# Patient Record
Sex: Male | Born: 1945 | Race: White | Hispanic: No | Smoking: Never smoker
Health system: Southern US, Community
[De-identification: ages and names within clinical notes are randomized; demographics above are authoritative.]

## PROBLEM LIST (undated history)

## (undated) DIAGNOSIS — K219 Gastro-esophageal reflux disease without esophagitis: Secondary | ICD-10-CM

## (undated) DIAGNOSIS — K259 Gastric ulcer, unspecified as acute or chronic, without hemorrhage or perforation: Secondary | ICD-10-CM

## (undated) DIAGNOSIS — M1990 Unspecified osteoarthritis, unspecified site: Secondary | ICD-10-CM

## (undated) DIAGNOSIS — C801 Malignant (primary) neoplasm, unspecified: Secondary | ICD-10-CM

## (undated) DIAGNOSIS — I10 Essential (primary) hypertension: Secondary | ICD-10-CM

## (undated) HISTORY — PX: EYE SURGERY: SHX253

## (undated) HISTORY — DX: Gastric ulcer, unspecified as acute or chronic, without hemorrhage or perforation: K25.9

## (undated) HISTORY — PX: BACK SURGERY: SHX140

## (undated) HISTORY — PX: ENUCLEATION: SHX628

## (undated) HISTORY — PX: OTHER SURGICAL HISTORY: SHX169

## (undated) HISTORY — DX: Essential (primary) hypertension: I10

## (undated) HISTORY — DX: Gastro-esophageal reflux disease without esophagitis: K21.9

---

## 2001-03-29 ENCOUNTER — Encounter: Payer: Self-pay | Admitting: Internal Medicine

## 2001-03-29 ENCOUNTER — Ambulatory Visit (HOSPITAL_COMMUNITY): Admission: RE | Admit: 2001-03-29 | Discharge: 2001-03-29 | Payer: Self-pay | Admitting: Internal Medicine

## 2001-08-28 ENCOUNTER — Ambulatory Visit (HOSPITAL_COMMUNITY): Admission: RE | Admit: 2001-08-28 | Discharge: 2001-08-28 | Payer: Self-pay | Admitting: Cardiology

## 2002-07-19 ENCOUNTER — Emergency Department (HOSPITAL_COMMUNITY): Admission: EM | Admit: 2002-07-19 | Discharge: 2002-07-20 | Payer: Self-pay | Admitting: Emergency Medicine

## 2003-09-16 ENCOUNTER — Ambulatory Visit (HOSPITAL_COMMUNITY): Admission: RE | Admit: 2003-09-16 | Discharge: 2003-09-16 | Payer: Self-pay

## 2004-08-23 ENCOUNTER — Ambulatory Visit: Payer: Self-pay | Admitting: Internal Medicine

## 2004-08-23 ENCOUNTER — Ambulatory Visit (HOSPITAL_COMMUNITY): Admission: RE | Admit: 2004-08-23 | Discharge: 2004-08-23 | Payer: Self-pay | Admitting: Internal Medicine

## 2004-08-23 ENCOUNTER — Encounter: Payer: Self-pay | Admitting: Internal Medicine

## 2008-02-24 ENCOUNTER — Inpatient Hospital Stay (HOSPITAL_COMMUNITY): Admission: EM | Admit: 2008-02-24 | Discharge: 2008-02-25 | Payer: Self-pay | Admitting: Emergency Medicine

## 2008-02-25 ENCOUNTER — Ambulatory Visit: Payer: Self-pay | Admitting: Cardiology

## 2008-02-25 ENCOUNTER — Encounter: Payer: Self-pay | Admitting: Cardiology

## 2008-02-26 ENCOUNTER — Ambulatory Visit: Payer: Self-pay | Admitting: Cardiology

## 2009-04-02 ENCOUNTER — Emergency Department (HOSPITAL_COMMUNITY): Admission: EM | Admit: 2009-04-02 | Discharge: 2009-04-02 | Payer: Self-pay | Admitting: Emergency Medicine

## 2009-09-17 ENCOUNTER — Encounter (INDEPENDENT_AMBULATORY_CARE_PROVIDER_SITE_OTHER): Payer: Self-pay | Admitting: *Deleted

## 2010-02-08 NOTE — Letter (Signed)
Summary: Recall, Screening Colonoscopy Only  [facility]  [address]   [place], [place] [address]   Phone: [phone]  Fax: [phone]    September 17, 2009  [name] [address] [place], [place]  [address] 12-08-1945   Dear Mr. Shuart,   Our records indicate it is time to schedule your colonoscopy.    Please call our office at [phone] and ask for the nurse.   Thank you,    Hendricks Limes, LPN Cloria Spring, LPN  [facility] Ph: [phone]   Fax: [phone]   Appended Document: Recall, Screening Colonoscopy Only Invalid address,I called pt at home number,lmom.  Appended Document: Recall, Screening Colonoscopy Only Pt stated he had tcs done by Dr Karilyn Cota 2 years ago.  Appended Document: Recall, Screening Colonoscopy Only Per Clydie Braun at Dr Patty Sermons office pt had tcs with [facility] 03/30/2008.

## 2010-04-04 LAB — COMPREHENSIVE METABOLIC PANEL
Alkaline Phosphatase: 59 U/L (ref 39–117)
BUN: 16 mg/dL (ref 6–23)
Creatinine, Ser: 1.42 mg/dL (ref 0.4–1.5)
Glucose, Bld: 152 mg/dL — ABNORMAL HIGH (ref 70–99)
Potassium: 3.6 mEq/L (ref 3.5–5.1)
Total Bilirubin: 1.1 mg/dL (ref 0.3–1.2)
Total Protein: 6.6 g/dL (ref 6.0–8.3)

## 2010-04-04 LAB — DIFFERENTIAL
Basophils Absolute: 0 10*3/uL (ref 0.0–0.1)
Basophils Relative: 0 % (ref 0–1)
Monocytes Relative: 9 % (ref 3–12)
Neutro Abs: 8.2 10*3/uL — ABNORMAL HIGH (ref 1.7–7.7)
Neutrophils Relative %: 79 % — ABNORMAL HIGH (ref 43–77)

## 2010-04-04 LAB — CBC
HCT: 40.1 % (ref 39.0–52.0)
Hemoglobin: 13.7 g/dL (ref 13.0–17.0)
MCV: 87.2 fL (ref 78.0–100.0)
RDW: 15.7 % — ABNORMAL HIGH (ref 11.5–15.5)

## 2010-04-26 LAB — BASIC METABOLIC PANEL
BUN: 13 mg/dL (ref 6–23)
Calcium: 9.6 mg/dL (ref 8.4–10.5)
Calcium: 9.1 mg/dL (ref 8.4–10.5)
Creatinine, Ser: 1.01 mg/dL (ref 0.4–1.5)
GFR calc Af Amer: >60 mL/min (ref >60)
GFR calc non Af Amer: >60 mL/min (ref >60)
GFR calc non Af Amer: >60 mL/min (ref >60)
Potassium: 3.8 mEq/L (ref 3.5–5.1)
Sodium: 141 mEq/L (ref 135–145)

## 2010-04-26 LAB — CBC
Hemoglobin: 13.3 g/dL (ref 13.0–17.0)
Platelets: 170 10*3/uL (ref 150–400)
RBC: 4.51 MIL/uL (ref 4.22–5.81)
RDW: 15.7 % — ABNORMAL HIGH (ref 11.5–15.5)
WBC: 7.9 10*3/uL (ref 4.0–10.5)
WBC: 6.3 10*3/uL (ref 4.0–10.5)

## 2010-04-26 LAB — URINALYSIS, ROUTINE W REFLEX MICROSCOPIC
Bilirubin Urine: NEGATIVE
Glucose, UA: 100 mg/dL — AB
Ketones, ur: NEGATIVE mg/dL
Protein, ur: NEGATIVE mg/dL

## 2010-04-26 LAB — GLUCOSE, CAPILLARY

## 2010-04-26 LAB — LIPID PANEL
HDL: 34 mg/dL — ABNORMAL LOW (ref >39)
LDL Cholesterol: 122 mg/dL — ABNORMAL HIGH (ref 0–99)
Total CHOL/HDL Ratio: 5.1 RATIO
Triglycerides: 92 mg/dL (ref <150)
VLDL: 18 mg/dL (ref 0–40)

## 2010-04-26 LAB — HEMOGLOBIN A1C
Hgb A1c MFr Bld: 6 % (ref 4.6–6.1)
Mean Plasma Glucose: 126 mg/dL

## 2010-04-26 LAB — DIFFERENTIAL
Basophils Absolute: 0 10*3/uL (ref 0.0–0.1)
Basophils Absolute: 0 10*3/uL (ref 0.0–0.1)
Lymphocytes Relative: 34 % (ref 12–46)
Lymphocytes Relative: 36 % (ref 12–46)
Lymphs Abs: 2.3 10*3/uL (ref 0.7–4.0)
Monocytes Absolute: 0.6 10*3/uL (ref 0.1–1.0)
Monocytes Relative: 7 % (ref 3–12)
Neutro Abs: 4.6 10*3/uL (ref 1.7–7.7)
Neutro Abs: 3.4 10*3/uL (ref 1.7–7.7)
Neutrophils Relative %: 54 % (ref 43–77)

## 2010-04-26 LAB — HEPATIC FUNCTION PANEL
AST: 20 U/L (ref 0–37)
Bilirubin, Direct: <0.1 mg/dL (ref 0.0–0.3)
Total Bilirubin: 0.7 mg/dL (ref 0.3–1.2)

## 2010-04-26 LAB — CARDIAC PANEL(CRET KIN+CKTOT+MB+TROPI)
CK, MB: 1.3 ng/mL (ref 0.3–4.0)
Relative Index: INVALID (ref 0.0–2.5)
Relative Index: INVALID (ref 0.0–2.5)
Troponin I: <0.01 ng/mL (ref 0.00–0.06)
Troponin I: <0.01 ng/mL (ref 0.00–0.06)

## 2010-04-26 LAB — POCT CARDIAC MARKERS
CKMB, poc: <1 ng/mL — ABNORMAL LOW (ref 1.0–8.0)
CKMB, poc: <1 ng/mL — ABNORMAL LOW (ref 1.0–8.0)
Myoglobin, poc: 35.2 ng/mL (ref 12–200)
Troponin i, poc: <0.05 ng/mL (ref 0.00–0.09)

## 2010-04-26 LAB — D-DIMER, QUANTITATIVE: D-Dimer, Quant: 0.31 ug/mL-FEU (ref 0.00–0.48)

## 2010-05-24 NOTE — Consult Note (Signed)
[name], [name]              ACCOUNT NO.:  [id number]   MEDICAL RECORD NO.:  [id number]          PATIENT TYPE:  INP   LOCATION:  A228                          FACILITY:  [facility]   PHYSICIAN:  Gerrit Friends. Dietrich Pates, MD, FACCDATE OF BIRTH:  03-07-1945   DATE OF CONSULTATION:  02/25/2008  DATE OF DISCHARGE:                                 CONSULTATION   REFERRING PHYSICIAN:  Dr. Lonia Blood of [facility].   PRIMARY CARE PHYSICIAN:  Dr. Samuel Jester.   CARDIOLOGIST:  He was previously evaluated by Dr. Diona Browner in [place] in  August of 2003.   REASON FOR REFERRAL:  Chest pain.   HISTORY OF PRESENT ILLNESS:  [name] is a 65 year old male with a  history of diabetes mellitus and hypertension who had normal coronary  arteries by cardiac catheterization in 2003 who presented to [facility] yesterday with substernal heaviness that began around 2 to 3:00  in the afternoon.  He was at rest watching television when this  occurred.  He denies associated shortness of breath, nausea,  diaphoresis.  He felt like it was difficult to take a deep breath.  He  denies any radiating symptoms.  His pain was not aggravated by anything.  He notes no improvement with nitroglycerin.  He is still having some  minor discomfort at this time.  He now notes some epigastric discomfort  as well.  His cardiac markers have been negative thus far.  We are now  asked to further evaluate for chest pain.   PAST MEDICAL HISTORY:  1. As outlined above.  Cardiac catheterization 2003 demonstrated      minimal irregularities in the LAD, normal LV function.  2. Diabetes mellitus.  3. Hypertension - he does not think he takes any medications for this      at this time.  4. Status post lipoma resection.  5. History of bradycardia.  6. History of left eye melanoma status post extraction.  7. History of right retinal detachment.   MEDICATIONS AT HOME:  1. Multivitamin daily.  2. Aspirin 81 mg  daily.  3. He thinks his other 2 medications are both diabetic medicines.  He      does not know the names.   ALLERGIES:  PENICILLIN, IV DYE, IODINE.   SOCIAL HISTORY:  The patient lives in [place] by himself.  He is  retired.  He is divorced.  He has 2 children.  He has a remote history  of tobacco smoking and quit in his teenage years.  He denies alcohol or  drug abuse.  He exercises daily without chest pain or shortness of  breath.   FAMILY HISTORY:  Significant for diabetes in his mother who is deceased.  Alzheimer's in his father who is deceased.  No premature CAD noted.   REVIEW OF SYSTEMS:  Please see HPI.  Denies fevers, chills, headache,  rash, sore throat, dysuria, hematuria, bright red blood per rectum or  melena, dysphagia, GERD symptoms, dyspnea with exertion, orthopnea, PND,  pedal edema, palpitations, syncope, near syncope.  He denies cough.  He  did  note some lightheadedness yesterday with standing.  The rest of the  review of systems are negative.   PHYSICAL EXAM:  CONSTITUTIONAL:  He is a well-nourished, well-developed  male in no acute distress.  VITAL SIGNS:  Blood pressure 122/71, pulse 52, respirations 20,  temperature 98.4, oxygen saturation 96% on 0.5 liters per minute.  HEENT:  Normal.  NECK:  Without JVD.  LYMPHATICS:  Without lymphadenopathy.  ENDOCRINE:  Without thyromegaly.  CARDIAC:  Normal S1, S2.  Regular rate and rhythm without murmur.  LUNGS:  Clear to auscultation bilaterally without wheezing, rhonchi or  rales.  SKIN:  Without rash.  ABDOMEN:  Soft with normoactive bowel sounds.  No organomegaly.  Positive epigastric tenderness with palpation.  EXTREMITIES:  Without clubbing, cyanosis or edema.  MUSCULOSKELETAL:  Without joint deformity.  NEUROLOGIC:  He is alert and oriented x3.  Cranial nerves II-XII grossly  intact.   DIAGNOSTIC STUDIES:  Chest x-ray demonstrates no active disease.  EKG  sinus bradycardia with a heart rate of 52, normal  axis, nonspecific ST-T  wave changes.   LABS:  White count 6300, hemoglobin 12.6 and platelet count 170,000.  Potassium 3.8, creatinine 1.01, glucose 117.  Point of care markers  negative x1.  CK 83, CK-MB 1.4, troponin-I less than 0.01.   ASSESSMENT:  1. Chest discomfort in a 65 year old male with a history of normal      coronaries by cardiac catheterization in 2003 and overall preserved      left ventricular function.  He is also experiencing some epigastric      pain that has begun since he presented to the hospital.  2. Diabetes mellitus.  3. Hypertension.   RECOMMENDATIONS:  The patient was also interviewed and examined by Dr.  Dietrich Pates.  The patient presents with symptoms that are fairly atypical  for ischemia.  He had a negative cath in 2003.  An echocardiogram will  be obtained as well as lipid panel.  His goal LDL really should be  around 70 or less with his history of diabetes  mellitus.  He will undergo gated exercise treadmill testing later today.  If this is negative, then we would suspect GI etiology to blame for his  symptoms.  Thank you very much for this consultation.  We will be glad  to follow the patient throughout the remainder of this admission.      Tereso Newcomer, PA-C      Gerrit Friends. Dietrich Pates, MD, [facility]  Electronically Signed    SW/MEDQ  D:  02/25/2008  T:  02/25/2008  Job:  [id number]   cc:   Lonia Blood, M.D.   Samuel Jester  Fax: [phone]

## 2010-05-24 NOTE — H&P (Signed)
[name], [name]              ACCOUNT NO.:  [id number]   MEDICAL RECORD NO.:  [id number]          PATIENT TYPE:  INP   LOCATION:  A228                          FACILITY:  [facility]   PHYSICIAN:  Osvaldo Shipper, MD     DATE OF BIRTH:  May 07, 1945   DATE OF ADMISSION:  02/24/2008  DATE OF DISCHARGE:  LH                              HISTORY & PHYSICAL   PRIMARY CARE PHYSICIAN:  [name], M.D.   ADMISSION DIAGNOSES:  1. Left-sided chest pain.  2. Type 2 diabetes.  3. Nonspecific abdominal pain.   CHIEF COMPLAINT:  Left-sided chest pain since this afternoon.   HISTORY OF PRESENT ILLNESS:  The patient is a 65 year old Caucasian male  who has type 2 diabetes, who was in his usual state of health until  about 4 p.m. today when he started developing left-sided chest pain.  He  describes this as a tightness which was about 4-5/10 in intensity.  The  pain did not radiate anywhere.  There was some nausea associated with  this pain.  No vomiting, no cough, no fever.  He also had some shortness  of breath, especially with deep breathing.  Denies any palpitations.  He  has never had similar pain in the past.  Nitroglycerin did not cause any  relief nor did Vicodin. He denies any leg swelling, denies any heart  disease in the past.   The patient also mentions that yesterday he was having pain in his  abdomen without any nausea or vomiting.  He is unable to describe the  symptoms any further at this time.   MEDICATIONS AT HOME:  Unfortunately he did not bring his home  medications.  He is on a lot of vitamins and he states he takes two  different medications for his diabetes but does not know the names.   ALLERGIES:  1. IVP DYE which causes hives.  2. PENICILLIN which causes hives.   PAST MEDICAL HISTORY:  1. Type 2 diabetes.  2. 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  The melanoma was approximately 20 years ago.   PAST SURGICAL HISTORY:  Lipoma.   SOCIAL HISTORY:  He lives in [place].  He is currently unemployed.  No  smoking, no alcohol use, no illicit drug use.   FAMILY HISTORY:  Positive for Alzheimer's, diabetes, and prostate  cancer.   REVIEW OF SYSTEMS:  GENERAL:  System positive for weakness.  HEENT:  Unremarkable.  CARDIOVASCULAR:  As in HPI.  RESPIRATORY:  As in HPI.  GENITOURINARY:  Unremarkable.  GASTROINTESTINAL:  As in HPI.  MUSCULOSKELETAL:  Unremarkable.  PSYCHIATRIC:  Unremarkable.  NEUROLOGIC:  Unremarkable.  RHEUMATOLOGIC:  Unremarkable.  Other systems  unremarkable.   PHYSICAL EXAMINATION:  VITAL SIGNS:  Temperature 98.5, blood pressure  when he came in was 143/69, last reading is 129/67, heart rate in the  60s and regular, respiratory rate 18, saturation 98% on room air.  GENERAL:  Obese white male in no distress.  HEENT:  There is no pallor, no icterus, although mucous membrane is  moist.  No oral lesions are noted.  Left eye is artificial, right eye  pupil is not circular and is poorly reacting to light.  LUNGS:  Clear to auscultation bilaterally.  CARDIOVASCULAR:  S1/S2 is normal, regular.  No murmurs appreciated.  No  S3, S4, no rubs, no bruits.  NECK:  Soft and supple.  No thyromegaly is appreciated.  ABDOMEN:  Soft, nondistended.  There is slight tenderness vaguely in the  epigastric area in the left upper quadrant, but no rebound, rigidity, or  guarding is present.  GENITOURINARY:  Examination deferred.  EXTREMITIES:  Show no edema.  MUSCULOSKELETAL:  Exam was unremarkable.  NEUROLOGIC:  He is alert, oriented x3.  No focal neurological deficits  are present.   LABORATORY DATA:  CBC is unremarkable.  BMET is unremarkable except for  a glucose of 132.  Two sets of cardiac markers are negative.   Chest x-ray shows no acute findings.  Mediastinum is normal size.  EKG  shows a sinus rhythm with a normal axis and  what appears to be in the  normal range.  Borderline criteria for LVH.  No Q-wave is noted.  He  does have evidence for incomplete RBBB.  No other acute ST or T-wave  changes are noted.  Previous EKG from 2005 shows PVCs and no other acute  abnormalities.   ASSESSMENT:  This is a 65 year old Caucasian male who has type 2  diabetes equivalent with left-sided chest tightness of about seven  hours' duration.  His EKG is nonacute.  Cardiac markers are negative so  far.  He does have risk factors in the form of obesity as well as  diabetes.  The patient needs observation in the hospital to rule him out  for acute coronary syndrome and for [place] to go ahead and consult  cardiology in the morning.   PLAN:  1. Chest pain.  We will put him on aspirin.  Lipid panel will be      checked.  EKG will be repeated.  Nitro paste will be utilized and      we may also give him some morphine as needed for pain.  Of note he      did have a cardiac cath in 2003 in which EF was about 60% and no      coronary artery disease was noted except for minimal irregularities      in the LAD.  2. Type 2 diabetes.  I have requested the family to bring in his home      medications.  Will check his CBGs q.a.c. and bedtime.  Put him on a      sliding scale.  He will be on a modified carbohydrate diet.  3. Abdominal pain.  This is very nonspecific.  Will check lipase and      his LFTs and order an ultrasound of his abdomen tomorrow morning.      Of note he did have an MRI of the abdomen in 2003.  No AAA was      noted.  There was evidence for  a sclerotic disease but patent SMA.      Celiac arteries and renal arteries were noted.  No      evidence of mesenteric ischemia was noted.  His UA will be checked      as well.  4. DVT prophylaxis will be  given.  He will be on PPI.   Further management decisions will depend on results of further testing  and patient's response to treatment.      Osvaldo Shipper, MD   Electronically Signed     GK/MEDQ  D:  02/24/2008  T:  02/24/2008  Job:  [id number]   cc:   [name]  Fax: [phone]

## 2010-05-27 NOTE — Op Note (Signed)
NAME:  [name], [name] NO.:  [id number]   MEDICAL RECORD NO.:  [id number]                   PATIENT TYPE:  OIB   LOCATION:  2852                                 FACILITY:  [facility]   PHYSICIAN:  Lorre Munroe., M.D.            DATE OF BIRTH:  05-19-1945   DATE OF PROCEDURE:  09/16/2003  DATE OF DISCHARGE:                                 OPERATIVE REPORT   PREOPERATIVE DIAGNOSIS:  Mass of the left thigh.   POSTOPERATIVE DIAGNOSIS:  Lipoma, left thigh.   OPERATION:  Excision of lipoma.   SURGEON:  Lebron Conners, M.D.   ANESTHESIA:  General and local.   DESCRIPTION OF PROCEDURE:  After The patient was monitored and anesthetized  with general anesthesia and had a routine preparation and draping of the  left upper inner thigh, I made about a 4 cm incision directly over the mass,  which I could feel in the deep subcutaneous tissues.  I dissected down  towards it through the normal-appearing fat.  Hemostasis with the cautery,  and dissecting with the cautery.  A typical lipoma was encountered, bulging  up from the tissues just superficial to the thigh muscles and lateral to the  femoral vessels.  It was a bit more firm and well-organized and surrounding  normal fat, but was obviously a lipoma and not a malignant mass.  I  completely excised it and discarded it, and then got good hemostasis with  cautery.  I thoroughly anesthetized the area with long-acting local  anesthetic.  I closed the subcutaneous tissues with interrupted #4-0 Vicryl  and closed the skin with a running inter-cuticular #4-0 Vicryl, reinforced  by Steri-Strips.  The patient tolerated the operation well.                                               Lorre Munroe., M.D.    Jodi Marble  D:  09/16/2003  T:  09/16/2003  Job:  [id number]

## 2010-05-27 NOTE — Procedures (Signed)
NAMEDREYSON, MISHKIN              ACCOUNT NO.:  [id number]   MEDICAL RECORD NO.:  [id number]          PATIENT TYPE:  INP   LOCATION:  A228                          FACILITY:  [facility]   PHYSICIAN:  Gerrit Friends. Dietrich Pates, MD, FACCDATE OF BIRTH:  [birth year]   DATE OF PROCEDURE:  02/26/2008  DATE OF DISCHARGE:  02/25/2008                                ECHOCARDIOGRAM   REFERRING PHYSICIAN:  Hollice Espy, MD, from [facility].   PRIMARY CARE PHYSICIAN:  Dr. Samuel Jester.   INDICATIONS:  A 65 year old gentleman admitted to hospital with chest  discomfort consistent with myocardial ischemia.  1. Treadmill exercise performed to a workload of 13 METS and a heart      rate of 140, 89% of age-predicted maximum.  Exercise discontinued      due to dyspnea and fatigue; no chest discomfort reported.  2. Blood pressure increased from a resting value of 140/75 to 210/80      at peak exercise, a mildly hypertensive response.  3. No arrhythmias noted.  4. EKG:  Sinus bradycardia at a rate of 59; right ventricular      conduction delay; otherwise unremarkable.  5. Stress EKG:  Insignificant upsloping ST-segment depression.  6. No arrhythmias noted.   IMPRESSION:  Negative and adequate graded exercise test revealing good  exercise tolerance, a mildly hypertensive blood pressure response, no  reproduction of the patient's presenting symptoms, and a normal stress  EKG.  Other findings as noted.      Gerrit Friends. Dietrich Pates, MD, [facility]  Electronically Signed     RMR/MEDQ  D:  02/26/2008  T:  02/27/2008  Job:  [id number]

## 2010-05-27 NOTE — Op Note (Signed)
NAME:  [name], [name]              ACCOUNT NO.:  [id number]   MEDICAL RECORD NO.:  [id number]          PATIENT TYPE:  AMB   LOCATION:  DAY                           [facility]ACILITY:  [facility]   PHYSICIAN:  R. Roetta Sessions, M.D. DATE OF BIRTH:  24-Dec-1945   DATE OF PROCEDURE:  08/23/2004  DATE OF DISCHARGE:                                 OPERATIVE REPORT   PROCEDURE:  Colonoscopy with snare polypectomy and ablation.   INDICATIONS FOR PROCEDURE:  The patient is a 65 year old gentleman with a  history of colonic adenomatous polyps, last colonoscopy was 2001. He had no  polyps on that examination. He is devoid of any lower GI tract symptoms. He  is here for surveillance. This approach has been discussed with the patient  at length. Potential risks, benefits, and alternatives have been reviewed  and questions answered. He is agreeable.   PROCEDURE NOTE:  O2 saturation, blood pressure, pulse, and respirations were  monitored throughout the entire procedure. Conscious sedation with Versed 4  mg IV and Demerol 100 mg IV. Prior to procedure, he received IV ampicillin  and gentamicin for SB prophylaxis prior to the procedure.   FINDINGS:  Digital rectal exam revealed no abnormalities.   ENDOSCOPIC FINDINGS:  Prep was good.   Rectum:  Examination of the rectal mucosa including retroflexed view of the  anal verge revealed a 6-mm polyp on a stalk at 15 cm. There were a couple of  3-mm diminutive polyps adjacent to the larger polyp. The remainder of the  rectal mucosa appeared normal.   Colon:  Colonic mucosa was surveyed from the rectosigmoid junction through  the left, transverse, and right colon to the area of the appendiceal  orifice, ileocecal valve, and cecum. These structures were well seen and  photographed for the record. From this level, the scope was slowly  withdrawn, and all previously mentioned mucosal surfaces were again seen.  The patient had few scattered pan colonic diverticula. The  remainder of the  colonic mucosa appeared normal. The pedunculated polyp in the rectum was  removed with snare cautery and recovered through the scope. The two adjacent  diminutive polyps were destroyed with the tip of the snare. The patient  tolerated the procedure well and was reactive to endoscopy.   IMPRESSION:  1.  Rectal polyps, treated as described above. Otherwise normal rectum.  2.  Few scattered pan colonic diverticula. The remainder of the colonic      mucosa appeared normal.   RECOMMENDATIONS:  1.  No aspirin or arthritis medications for 10 days.  2.  Followup on pathology.  3.  Further recommendations to follow.      Jonathon Bellows, M.D.  Electronically Signed     RMR/MEDQ  D:  08/23/2004  T:  08/23/2004  Job:  [id number]

## 2010-05-27 NOTE — Cardiovascular Report (Signed)
NAME:  KARNELL, VANDERLOOP                        ACCOUNT NO.:  [id number]   MEDICAL RECORD NO.:  [id number]                   PATIENT TYPE:  OIB   LOCATION:  2899                                 FACILITY:  [facility]   PHYSICIAN:  Everardo Beals. Juanda Chance, M.D. [facility]           DATE OF BIRTH:  Oct 19, 1945   DATE OF PROCEDURE:  08/28/2001  DATE OF DISCHARGE:  08/28/2001                              CARDIAC CATHETERIZATION   CLINICAL HISTORY:  The patient is a 64 year old who has positive risk  factors for coronary disease, including diabetes, and had what was felt to  be non-obstructive coronary disease with catheterization several years ago.  He was followed by Dr. Cory Roughen in [place], but recently his insurance  changed.  Dr. Cory Roughen had recommended catheterization, and when his  insurance changed he saw Dr. Diona Browner who concurred and arranged for him to  be evaluated here.   PROCEDURE:  The procedure was performed with the right femoral artery and  atrial sheath, and 6 French sheath on coronary catheters.  A femoral  arterial puncture was performed, and Omnipaque contrast was used.  The right  femoral artery was closed with Perclose at the end of the procedure.  The  patient tolerated the procedure well, and left the laboratory in  satisfactory condition.   RESULTS:  1. The left main coronary artery is free of disease.  2. The left anterior descending gave rise to three diagonal branches and     three septal perforators.  The left anterior descending was slightly     irregular, but there was no significant obstruction.  3. The circumflex artery gave rise to two marginal branches and two     posterolateral branches.  This was a codominant vessel.  These vessels     were free of significant disease.  4. The right coronary artery was a small to moderate sized vessel that gave     rise to a conus branch, right ________branch, a posterior descending and     a very small posterolateral branch.   These vessels were free of     significant disease.  5. The left ventriculogram performed in the RAO projection showed good wall     motion and no areas of hypokinesis.  The estimated ejection fraction was     60%.  6. The aortic pressure was 123/85 with a mean of 103, and left ventricular     pressure was 123/23.   CONCLUSION:  Normal coronary angiography except for minimal irregularities  in the left anterior descending and normal left ventricular function.    RECOMMENDATIONS:  Reassurance.  The patient's arteries looked amazingly  clean, considering his risk factors and diabetes.  We will plan reassurance  and let him go home as an outpatient today, and arrange for followup with  Dr. Charm Barges in a week.  I spoke with Dr. Charm Barges today.  Bruce Elvera Lennox Juanda Chance, M.D. [facility]    BRB/MEDQ  D:  08/28/2001  T:  08/31/2001  Job:  [id number]   cc:   Madelin Headings, M.D. [facility]   Cardiopulmonary Lab

## 2010-06-18 ENCOUNTER — Emergency Department (HOSPITAL_COMMUNITY): Payer: Medicare Other

## 2010-06-18 ENCOUNTER — Emergency Department (HOSPITAL_COMMUNITY)
Admission: EM | Admit: 2010-06-18 | Discharge: 2010-06-18 | Disposition: A | Discharge status: Home / Self Care | Payer: Medicare Other | Attending: Emergency Medicine | Admitting: Emergency Medicine

## 2010-06-18 DIAGNOSIS — M1710 Unilateral primary osteoarthritis, unspecified knee: Secondary | ICD-10-CM | POA: Insufficient documentation

## 2010-06-18 DIAGNOSIS — E119 Type 2 diabetes mellitus without complications: Secondary | ICD-10-CM | POA: Insufficient documentation

## 2010-06-18 DIAGNOSIS — M25569 Pain in unspecified knee: Secondary | ICD-10-CM | POA: Insufficient documentation

## 2010-06-18 DIAGNOSIS — M79609 Pain in unspecified limb: Secondary | ICD-10-CM | POA: Insufficient documentation

## 2010-08-10 HISTORY — PX: KNEE ARTHROSCOPY: SHX127

## 2010-08-16 ENCOUNTER — Ambulatory Visit (HOSPITAL_BASED_OUTPATIENT_CLINIC_OR_DEPARTMENT_OTHER)
Admission: RE | Admit: 2010-08-16 | Discharge: 2010-08-16 | Disposition: A | Discharge status: Home / Self Care | Payer: Medicare Other | Source: Ambulatory Visit | Attending: Orthopedic Surgery | Admitting: Orthopedic Surgery

## 2010-08-16 DIAGNOSIS — S83289A Other tear of lateral meniscus, current injury, unspecified knee, initial encounter: Secondary | ICD-10-CM | POA: Insufficient documentation

## 2010-08-16 DIAGNOSIS — E119 Type 2 diabetes mellitus without complications: Secondary | ICD-10-CM | POA: Insufficient documentation

## 2010-08-16 DIAGNOSIS — IMO0002 Reserved for concepts with insufficient information to code with codable children: Secondary | ICD-10-CM | POA: Insufficient documentation

## 2010-08-16 DIAGNOSIS — Z01812 Encounter for preprocedural laboratory examination: Secondary | ICD-10-CM | POA: Insufficient documentation

## 2010-08-16 DIAGNOSIS — Z79899 Other long term (current) drug therapy: Secondary | ICD-10-CM | POA: Insufficient documentation

## 2010-08-16 DIAGNOSIS — M1710 Unilateral primary osteoarthritis, unspecified knee: Secondary | ICD-10-CM | POA: Insufficient documentation

## 2010-08-16 DIAGNOSIS — X58XXXA Exposure to other specified factors, initial encounter: Secondary | ICD-10-CM | POA: Insufficient documentation

## 2010-08-16 DIAGNOSIS — Z01810 Encounter for preprocedural cardiovascular examination: Secondary | ICD-10-CM | POA: Insufficient documentation

## 2010-08-16 LAB — POCT I-STAT 4, (NA,K, GLUC, HGB,HCT)
Potassium: 4.6 mEq/L (ref 3.5–5.1)
Sodium: 141 mEq/L (ref 135–145)

## 2010-08-22 NOTE — Op Note (Signed)
  NAMEABDULLAHI, VALLONE NO.:  [id number]  MEDICAL RECORD NO.:  [id number]  LOCATION:                                 FACILITY:  PHYSICIAN:  Georges Lynch. Robinson Brinkley, M.D.DATE OF BIRTH:  12-14-1945  DATE OF PROCEDURE:  08/16/2010 DATE OF DISCHARGE:                              OPERATIVE REPORT   SURGEON:  Georges Lynch. Terricka Onofrio, MD  PREOPERATIVE DIAGNOSES: 1. Degenerative arthritis, left knee. 2. Torn medial meniscus, left knee. 3. Torn lateral meniscus, left knee.  POSTOPERATIVE DIAGNOSES: 1. Degenerative arthritis, left knee. 2. Torn medial meniscus, left knee. 3. Torn lateral meniscus, left knee.  OPERATION: 1. Diagnostic arthroscopy, left knee. 2. Medial meniscectomy, left knee. 3. Lateral meniscectomy, left knee. 4. Abrasion chondroplasty of patella, left knee. 5. Abrasion chondroplasty of the medial femoral condyle, left knee to     bleeding bone. 6. Microfracture technique, left medial femoral condyle. PROCEDURE:  We had marked the appropriate left leg in the operating room.  Under general anesthesia, routine orthopedic prep and draping of the left knee was carried out.  A small punctate incision was made in the suprapatellar pouch.  Inflow cannula was inserted, knee was distended with saline.  Another small punctate incision was made in the anterolateral joint.  The arthroscope was entered from lateral approach and a complete diagnostic arthroscopy was carried out.  Following that, I then made a small incision in the medial joint, __________ shaver suction device in medial approach and did an abrasion chondroplasty of the medial femoral condyle down to bleeding bone, done in microfracture technique.  Following that, I went up in the suprapatellar pouch, did abrasion chondroplasty of patella, had severe chondromalacia.  Following that, I went ahead and examined the medial meniscus.  I did a medial meniscectomy, had a tear at the posterior horn.  Cruciates  were intact and over the lateral joint.  The lateral joint showed peripheral tear of the lateral meniscus.  I introduced a shaver suction device, did a partial lateral meniscectomy.  The main part of the body and meniscus was intact.  We thoroughly irrigated out the knee.  There were no other abnormalities noted.  I did thoroughly investigate the knee.  There were no loose bodies, etc.  Then, removed all fluid.  Closed all three punctate incisions with 3-0 nylon suture.  I injected 20 mL of 0.25% Marcaine with epinephrine in the knee joint.  Sterile Neosporin bundle dressing was applied.  POSTOP INSTRUCTIONS: 1. Postop, he will be on aspirin 325 mg b.i.d., start today, and     b.i.d. for 2 weeks. 2. He will see me in the office in about 10 to 12 days for suture     removal. 3. He will be on Percocet 10/650 one every 4 hours p.r.n. for pain. 4. Be on crutches, partial weightbearing as tolerated.          ______________________________ Georges Lynch. Darrelyn Hillock, M.D.     RAG/MEDQ  D:  08/16/2010  T:  08/17/2010  Job:  [id number] Electronically Signed by Ranee Gosselin M.D. on 08/22/2010 08:25:21 AM

## 2010-10-22 NOTE — Op Note (Signed)
  NAMECHRISTOPHERJOHN, SCHIELE NO.:  [id number]  MEDICAL RECORD NO.:  [id number]  LOCATION:                               FACILITY:  [facility]  PHYSICIAN:  Georges Lynch. Bettyanne Dittman, M.D.DATE OF BIRTH:  21-Mar-1945  DATE OF PROCEDURE: DATE OF DISCHARGE:                              OPERATIVE REPORT   ADDENDUM  This is a fill in the blank for the surgical dictation that I did. Apparently, that was done on August 16, 2010 and I was told I just needed to call in and correct the blank.  The blank is this:  __________ shaver suction device and it she should be - I inserted the - shaver suction device.          ______________________________ Georges Lynch Darrelyn Hillock, M.D.     RAG/MEDQ  D:  10/20/2010  T:  10/21/2010  Job:  [id number]  Electronically Signed by Ranee Gosselin M.D. on 10/22/2010 10:03:16 AM

## 2011-04-04 NOTE — H&P (Signed)
[name] DOB: Feb 27, 1945   Chief Complaint: left knee pain  History of Present Illness The patient is a 66 year old male who comes in today for a preoperative History and Physical. The patient is scheduled for a left total knee arthroplasty to be performed by Dr. Georges Lynch. Darrelyn Hillock, MD at [facility] on Wednesday March 26, 2011 . The patient is a 66 year old male who presented to Dr. Jeannetta Ellis office in July 2012 with left knee complaints of pain and instability. MRI showed that he had has medial and lateral meniscus tears in the left knee. He underwent a menisectomy in the left knee arthroscopically. The scope procedure also revealed bone on bone degenerative changes in the medial compartment and early arthritic changes in the patellofemoral compartment. After recovery, he continued to have pain. He had cortisone injections as well as Hyalgan injections which provided minimal relief. Due to failure of conservative measures, most predictable means for decreased pain and increased function is a left total knee arthroplasty. Risks and benefits of the surgery discussed. Patient received surgical clearance from Dr. Samuel Jester. PCP: Dr. Samuel Jester    Past Medical History Osteoarthritis, knee (715.96) Impaired Hearing Impaired Memory Cataract Sinusitis Kidney Stone Diabetes Mellitus, Type II Osteoarthritis Measles. childhood Mumps. 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 Contrast   Family History Father. deceased age 50 Alzheimers Mother. deceased age 34 Diabetes   Social History Alcohol use. Occasional alcohol use. Tobacco use. Former smoker, Uses chewing tobacco. 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 Divorced. Children. 2. Living situation. Lives alone.   Medication History Hydrocodone-Acetaminophen (5-500MG  Tablet, two tablets Oral four times daily, as needed) Active. Xanax  (1MG  Tablet, Oral two times daily) Active. Pioglitazone HCl (45MG  Tablet, Oral daily) Active. Januvia (100MG  Tablet, Oral daily) Active. Magnesium Chloride (64MG  Tablet ER, Oral daily) Active. Vitamin D (400UNIT Tablet, Oral daily) Active. Vitamin E (100UNIT Tablet, Oral daily) Active. Fish Oil (1000MG  Capsule, Oral daily) Active.   Past Surgical History Arthroscopic Knee Surgery - Left. medial and lateral meniscectomy July 2012 Retinal right reattachment. 2005 Left Eye Removal. due to cancer 1980s   Review of Systems General:Present- Night Sweats. Not Present- Chills, Fever, Fatigue, Weight Gain, Weight Loss and Memory Loss. Skin:Not Present- Hives, Itching, Rash, Eczema and Lesions. HEENT:Not Present- Tinnitus, Headache, Double Vision, Visual Loss, Hearing Loss and Dentures. Respiratory:Not Present- Shortness of breath with exertion, Shortness of breath at rest, Allergies, Coughing up blood and Chronic Cough. Cardiovascular:Not Present- Chest Pain, Racing/skipping heartbeats, Difficulty Breathing Lying Down, Murmur, Swelling and Palpitations. Gastrointestinal:Not Present- Bloody Stool, Heartburn, Abdominal Pain, Vomiting, Nausea, Constipation, Diarrhea, Difficulty Swallowing, Jaundice and Loss of appetitie. Musculoskeletal:Present- Joint Pain and Back Pain. Not Present- Muscle Weakness, Muscle Pain, Joint Swelling, Morning Stiffness and Spasms. Neurological:Not Present- Tremor, Dizziness, Blackout spells, Paralysis, Difficulty with balance and Weakness. Psychiatric:Not Present- Insomnia.   Vitals 04/04/2011 9:49 AM Weight: 245 lb Height: 69 in Body Surface Area: 2.33 m Body Mass Index: 36.18 kg/m Pulse: 61 (Regular) Resp.: 18 (Unlabored) BP: 149/76 (Sitting, Left Arm, Standard)    Physical Exam General Mental Status - Alert, cooperative and good historian. General Appearance- pleasant. Not in acute distress. Orientation- Oriented X3. Build &  Nutrition- Overweight, Well nourished and Well developed. Head and Neck Head- normocephalic, atraumatic . Neck Global Assessment- supple. no bruit auscultated on the right and no bruit auscultated on the left. Eye Eyeball- Left- Absent. Note: Glass eye Pupil- Bilateral- PERRLA. Motion- Bilateral- EOMI (in right  eye). Chest and Lung Exam Auscultation: Breath sounds:- clear at anterior chest wall and - clear at posterior chest wall. Adventitious sounds:- No Adventitious sounds. Cardiovascular Auscultation:Rhythm- Regular rate and rhythm. Heart Sounds- S1 WNL and S2 WNL. Murmurs & Other Heart Sounds:Auscultation of the heart reveals - No Murmurs. Abdomen Palpation/Percussion:Tenderness- Abdomen is non-tender to palpation. Rigidity (guarding)- Abdomen is soft. Auscultation:Auscultation of the abdomen reveals - Bowel sounds normal. Male Genitourinary Not done, not pertinent to present illness Peripheral Vascular Upper Extremity: Palpation:- Pulses bilaterally normal. Lower Extremity: Palpation:- Pulses bilaterally normal. Neurologic Examination of related systems reveals - normal muscle strength and tone in all extremities. Neurologic evaluation reveals - normal sensation and upper and lower extremity deep tendon reflexes intact bilaterally . Musculoskeletal Left knee tender along the medial jointline with pain on range of motion. There is moderate crepitus with motion. Ligaments are stable. Varus deformity in the left knee. Hips and right knee have normal painless ROM. Slight varus deformity in the right knee.   RADIOGRAPHS: We reviewed X-rays from December 2012 and he has bone on bone arthritis on the medial compartment of the knee and narrowing in the patellofemoral compartment. The lateral compartment looks to be OK.   Assessment & Plan Osteoarthritis, knee (715.96) Left total knee arthroplasty    Dimitri Ped, PA-C

## 2011-04-12 ENCOUNTER — Encounter (HOSPITAL_COMMUNITY): Payer: Self-pay | Admitting: Pharmacy Technician

## 2011-04-18 ENCOUNTER — Encounter (HOSPITAL_COMMUNITY): Payer: Self-pay

## 2011-04-18 ENCOUNTER — Encounter (HOSPITAL_COMMUNITY)
Admission: RE | Admit: 2011-04-18 | Discharge: 2011-04-18 | Disposition: A | Discharge status: Home / Self Care | Payer: Medicare Other | Source: Ambulatory Visit | Attending: Orthopedic Surgery | Admitting: Orthopedic Surgery

## 2011-04-18 HISTORY — DX: Malignant (primary) neoplasm, unspecified: C80.1

## 2011-04-18 HISTORY — DX: Unspecified osteoarthritis, unspecified site: M19.90

## 2011-04-18 LAB — COMPREHENSIVE METABOLIC PANEL
ALT: 19 U/L (ref 0–53)
AST: 21 U/L (ref 0–37)
Albumin: 4.1 g/dL (ref 3.5–5.2)
Alkaline Phosphatase: 79 U/L (ref 39–117)
BUN: 16 mg/dL (ref 6–23)
CO2: 30 mEq/L (ref 19–32)
Calcium: 9.8 mg/dL (ref 8.4–10.5)
Chloride: 105 mEq/L (ref 96–112)
Creatinine, Ser: 0.85 mg/dL (ref 0.50–1.35)
GFR calc Af Amer: >90 mL/min (ref >90)
GFR calc non Af Amer: 88 mL/min — ABNORMAL LOW (ref >90)
Glucose, Bld: 104 mg/dL — ABNORMAL HIGH (ref 70–99)
Potassium: 4.5 mEq/L (ref 3.5–5.1)
Sodium: 142 mEq/L (ref 135–145)
Total Bilirubin: 0.6 mg/dL (ref 0.3–1.2)
Total Protein: 7.3 g/dL (ref 6.0–8.3)

## 2011-04-18 LAB — URINALYSIS, ROUTINE W REFLEX MICROSCOPIC
Bilirubin Urine: NEGATIVE
Glucose, UA: 100 mg/dL — AB
Hgb urine dipstick: NEGATIVE
Ketones, ur: NEGATIVE mg/dL
Leukocytes, UA: NEGATIVE
Nitrite: NEGATIVE
Protein, ur: NEGATIVE mg/dL
Specific Gravity, Urine: 1.023 (ref 1.005–1.030)
Urobilinogen, UA: 1 mg/dL (ref 0.0–1.0)
pH: 7 (ref 5.0–8.0)

## 2011-04-18 LAB — CBC
HCT: 45.7 % (ref 39.0–52.0)
Hemoglobin: 14.3 g/dL (ref 13.0–17.0)
MCH: 28.2 pg (ref 26.0–34.0)
MCHC: 31.3 g/dL (ref 30.0–36.0)
MCV: 90.1 fL (ref 78.0–100.0)
Platelets: 197 10*3/uL (ref 150–400)
RBC: 5.07 MIL/uL (ref 4.22–5.81)
RDW: 14.8 % (ref 11.5–15.5)
WBC: 5.2 10*3/uL (ref 4.0–10.5)

## 2011-04-18 LAB — SURGICAL PCR SCREEN
MRSA, PCR: NEGATIVE
Staphylococcus aureus: POSITIVE — AB

## 2011-04-18 LAB — DIFFERENTIAL
Basophils Absolute: 0.1 10*3/uL (ref 0.0–0.1)
Basophils Relative: 1 % (ref 0–1)
Eosinophils Absolute: 0.2 10*3/uL (ref 0.0–0.7)
Eosinophils Relative: 3 % (ref 0–5)
Lymphocytes Relative: 41 % (ref 12–46)
Lymphs Abs: 2.1 10*3/uL (ref 0.7–4.0)
Monocytes Absolute: 0.4 10*3/uL (ref 0.1–1.0)
Monocytes Relative: 8 % (ref 3–12)
Neutro Abs: 2.4 10*3/uL (ref 1.7–7.7)
Neutrophils Relative %: 47 % (ref 43–77)

## 2011-04-18 LAB — APTT: aPTT: 33 seconds (ref 24–37)

## 2011-04-18 LAB — PROTIME-INR
INR: 1 (ref 0.00–1.49)
Prothrombin Time: 13.4 seconds (ref 11.6–15.2)

## 2011-04-18 NOTE — Patient Instructions (Signed)
20 [name]  04/18/2011   Your procedure is scheduled on:  04/26/11  Report to SHORT STAY DEPT  at 6:00 AM.  Call this number if you have problems the morning of surgery: [phone]   Remember:   Do not eat food or drink liquids AFTER MIDNIGHT    Take these medicines the morning of surgery with A SIP OF WATER: HYDROCODONE  / ALPRAZALAM IF NEEDED   Do not wear jewelry, make-up or nail polish.  Do not wear lotions, powders, or perfumes.   Do not shave legs or underarms 48 hrs. before surgery (men may shave face)  Do not bring valuables to the hospital.  Contacts, dentures or bridgework may not be worn into surgery.  Leave suitcase in the car. After surgery it may be brought to your room.  For patients admitted to the hospital, checkout time is 11:00 AM the day of discharge.   Patients discharged the day of surgery will not be allowed to drive home.  Name and phone number of your driver:   Special Instructions:   Please read over the following fact sheets that you were given: MRSA  Information               SHOWER WITH BETASEPT THE NIGHT BEFORE SURGERY AND THE MORNING OF SURGERY

## 2011-04-26 ENCOUNTER — Ambulatory Visit (HOSPITAL_COMMUNITY): Payer: Medicare Other

## 2011-04-26 ENCOUNTER — Encounter (HOSPITAL_COMMUNITY)
Admission: RE | Disposition: A | Discharge status: Home Health | Payer: Self-pay | Source: Ambulatory Visit | Attending: Orthopedic Surgery

## 2011-04-26 ENCOUNTER — Encounter (HOSPITAL_COMMUNITY): Payer: Self-pay | Admitting: *Deleted

## 2011-04-26 ENCOUNTER — Inpatient Hospital Stay (HOSPITAL_COMMUNITY)
Admission: RE | Admit: 2011-04-26 | Discharge: 2011-04-30 | DRG: 470 | Disposition: A | Discharge status: Home Health | Payer: Medicare Other | Source: Ambulatory Visit | Attending: Orthopedic Surgery | Admitting: Orthopedic Surgery

## 2011-04-26 ENCOUNTER — Ambulatory Visit (HOSPITAL_COMMUNITY): Payer: Medicare Other | Admitting: Anesthesiology

## 2011-04-26 ENCOUNTER — Encounter (HOSPITAL_COMMUNITY): Payer: Self-pay | Admitting: Anesthesiology

## 2011-04-26 DIAGNOSIS — M1710 Unilateral primary osteoarthritis, unspecified knee: Principal | ICD-10-CM | POA: Diagnosis present

## 2011-04-26 DIAGNOSIS — E119 Type 2 diabetes mellitus without complications: Secondary | ICD-10-CM | POA: Diagnosis present

## 2011-04-26 DIAGNOSIS — M179 Osteoarthritis of knee, unspecified: Secondary | ICD-10-CM | POA: Diagnosis present

## 2011-04-26 DIAGNOSIS — Z85820 Personal history of malignant melanoma of skin: Secondary | ICD-10-CM

## 2011-04-26 DIAGNOSIS — M24569 Contracture, unspecified knee: Secondary | ICD-10-CM | POA: Diagnosis present

## 2011-04-26 DIAGNOSIS — Z01812 Encounter for preprocedural laboratory examination: Secondary | ICD-10-CM

## 2011-04-26 DIAGNOSIS — Z96659 Presence of unspecified artificial knee joint: Secondary | ICD-10-CM

## 2011-04-26 DIAGNOSIS — D62 Acute posthemorrhagic anemia: Secondary | ICD-10-CM | POA: Diagnosis not present

## 2011-04-26 HISTORY — PX: TOTAL KNEE ARTHROPLASTY: SHX125

## 2011-04-26 LAB — TYPE AND SCREEN
ABO/RH(D): O POS
Antibody Screen: NEGATIVE

## 2011-04-26 LAB — GLUCOSE, CAPILLARY: Glucose-Capillary: 172 mg/dL — ABNORMAL HIGH (ref 70–99)

## 2011-04-26 SURGERY — ARTHROPLASTY, KNEE, TOTAL
Anesthesia: General | Site: Knee | Laterality: Left | Wound class: Clean

## 2011-04-26 MED ORDER — FERROUS SULFATE 325 (65 FE) MG PO TABS
325.0000 mg | ORAL_TABLET | Freq: Three times a day (TID) | ORAL | Status: DC
  Administered 2011-04-26 – 2011-04-30 (×11): 325 mg via ORAL
  Filled 2011-04-26 (×13): qty 1

## 2011-04-26 MED ORDER — THROMBIN 5000 UNITS EX SOLR
CUTANEOUS | Status: AC
  Filled 2011-04-26: qty 10000

## 2011-04-26 MED ORDER — BACITRACIN-NEOMYCIN-POLYMYXIN 400-5-5000 EX OINT
TOPICAL_OINTMENT | CUTANEOUS | Status: AC
  Filled 2011-04-26: qty 1

## 2011-04-26 MED ORDER — HYDROMORPHONE HCL PF 1 MG/ML IJ SOLN
0.2500 mg | INTRAMUSCULAR | Status: DC | PRN
  Administered 2011-04-26 (×3): 0.5 mg via INTRAVENOUS

## 2011-04-26 MED ORDER — ONDANSETRON HCL 4 MG/2ML IJ SOLN: 4.0000 mg | Freq: Four times a day (QID) | INTRAMUSCULAR | Status: DC | PRN

## 2011-04-26 MED ORDER — OXYCODONE-ACETAMINOPHEN 5-325 MG PO TABS: 1.0000 | ORAL_TABLET | ORAL | Status: DC | PRN

## 2011-04-26 MED ORDER — PIOGLITAZONE HCL 45 MG PO TABS
45.0000 mg | ORAL_TABLET | Freq: Every day | ORAL | Status: DC
  Administered 2011-04-27 – 2011-04-30 (×4): 45 mg via ORAL
  Filled 2011-04-26 (×4): qty 1

## 2011-04-26 MED ORDER — RIVAROXABAN 10 MG PO TABS
10.0000 mg | ORAL_TABLET | Freq: Every day | ORAL | Status: DC
  Administered 2011-04-27 – 2011-04-30 (×4): 10 mg via ORAL
  Filled 2011-04-26 (×4): qty 1

## 2011-04-26 MED ORDER — FENTANYL CITRATE 0.05 MG/ML IJ SOLN
INTRAMUSCULAR | Status: DC | PRN
  Administered 2011-04-26: 50 ug via INTRAVENOUS
  Administered 2011-04-26: 25 ug via INTRAVENOUS
  Administered 2011-04-26 (×2): 50 ug via INTRAVENOUS
  Administered 2011-04-26: 100 ug via INTRAVENOUS
  Administered 2011-04-26: 150 ug via INTRAVENOUS
  Administered 2011-04-26: 50 ug via INTRAVENOUS
  Administered 2011-04-26: 25 ug via INTRAVENOUS

## 2011-04-26 MED ORDER — METHOCARBAMOL 100 MG/ML IJ SOLN
500.0000 mg | Freq: Four times a day (QID) | INTRAVENOUS | Status: DC | PRN
  Filled 2011-04-26: qty 5

## 2011-04-26 MED ORDER — PHENOL 1.4 % MT LIQD
1.0000 | OROMUCOSAL | Status: DC | PRN
  Filled 2011-04-26: qty 177

## 2011-04-26 MED ORDER — BACITRACIN ZINC 500 UNIT/GM EX OINT
TOPICAL_OINTMENT | CUTANEOUS | Status: DC | PRN
  Administered 2011-04-26: 1 via TOPICAL

## 2011-04-26 MED ORDER — HYDROMORPHONE HCL PF 1 MG/ML IJ SOLN
0.5000 mg | INTRAMUSCULAR | Status: DC | PRN
  Administered 2011-04-26 – 2011-04-27 (×2): 0.5 mg via INTRAVENOUS
  Filled 2011-04-26 (×3): qty 1

## 2011-04-26 MED ORDER — HYDROMORPHONE HCL PF 1 MG/ML IJ SOLN
INTRAMUSCULAR | Status: DC | PRN
  Administered 2011-04-26 (×4): 0.5 mg via INTRAVENOUS

## 2011-04-26 MED ORDER — MENTHOL 3 MG MT LOZG
1.0000 | LOZENGE | OROMUCOSAL | Status: DC | PRN
  Filled 2011-04-26: qty 9

## 2011-04-26 MED ORDER — DROPERIDOL 2.5 MG/ML IJ SOLN
INTRAMUSCULAR | Status: DC | PRN
  Administered 2011-04-26: .5 mg via INTRAVENOUS

## 2011-04-26 MED ORDER — KETAMINE HCL 10 MG/ML IJ SOLN
INTRAMUSCULAR | Status: DC | PRN
  Administered 2011-04-26 (×10): 2 mg via INTRAVENOUS

## 2011-04-26 MED ORDER — HYDROMORPHONE HCL PF 1 MG/ML IJ SOLN
INTRAMUSCULAR | Status: AC
  Filled 2011-04-26: qty 1

## 2011-04-26 MED ORDER — BUPIVACAINE LIPOSOME 1.3 % IJ SUSP
INTRAMUSCULAR | Status: DC | PRN
  Administered 2011-04-26: 20 mL

## 2011-04-26 MED ORDER — SUCCINYLCHOLINE CHLORIDE 20 MG/ML IJ SOLN
INTRAMUSCULAR | Status: DC | PRN
  Administered 2011-04-26: 100 mg via INTRAVENOUS

## 2011-04-26 MED ORDER — ACETAMINOPHEN 10 MG/ML IV SOLN
INTRAVENOUS | Status: AC
  Filled 2011-04-26: qty 100

## 2011-04-26 MED ORDER — ACETAMINOPHEN 650 MG RE SUPP: 650.0000 mg | Freq: Four times a day (QID) | RECTAL | Status: DC | PRN

## 2011-04-26 MED ORDER — LACTATED RINGERS IV SOLN: INTRAVENOUS | Status: DC

## 2011-04-26 MED ORDER — ALPRAZOLAM 1 MG PO TABS
1.0000 mg | ORAL_TABLET | Freq: Two times a day (BID) | ORAL | Status: DC | PRN
  Administered 2011-04-26 – 2011-04-28 (×2): 1 mg via ORAL
  Filled 2011-04-26 (×2): qty 1

## 2011-04-26 MED ORDER — ALUM & MAG HYDROXIDE-SIMETH 200-200-20 MG/5ML PO SUSP: 30.0000 mL | ORAL | Status: DC | PRN

## 2011-04-26 MED ORDER — GLYCOPYRROLATE 0.2 MG/ML IJ SOLN
INTRAMUSCULAR | Status: DC | PRN
  Administered 2011-04-26: .6 mg via INTRAVENOUS

## 2011-04-26 MED ORDER — ONDANSETRON HCL 4 MG PO TABS: 4.0000 mg | ORAL_TABLET | Freq: Four times a day (QID) | ORAL | Status: DC | PRN

## 2011-04-26 MED ORDER — MIDAZOLAM HCL 5 MG/5ML IJ SOLN
INTRAMUSCULAR | Status: DC | PRN
  Administered 2011-04-26: 1 mg via INTRAVENOUS

## 2011-04-26 MED ORDER — BUPIVACAINE LIPOSOME 1.3 % IJ SUSP
20.0000 mL | Freq: Once | INTRAMUSCULAR | Status: DC
  Filled 2011-04-26: qty 20

## 2011-04-26 MED ORDER — PROMETHAZINE HCL 25 MG/ML IJ SOLN: 6.2500 mg | INTRAMUSCULAR | Status: DC | PRN

## 2011-04-26 MED ORDER — BISACODYL 10 MG RE SUPP
10.0000 mg | Freq: Every day | RECTAL | Status: DC | PRN
  Administered 2011-04-30: 10 mg via RECTAL
  Filled 2011-04-26: qty 1

## 2011-04-26 MED ORDER — LACTATED RINGERS IV SOLN
INTRAVENOUS | Status: DC | PRN
  Administered 2011-04-26 (×2): via INTRAVENOUS

## 2011-04-26 MED ORDER — POLYETHYLENE GLYCOL 3350 17 G PO PACK
17.0000 g | PACK | Freq: Every day | ORAL | Status: DC | PRN
  Administered 2011-04-27 – 2011-04-29 (×2): 17 g via ORAL
  Filled 2011-04-26: qty 1

## 2011-04-26 MED ORDER — ACETAMINOPHEN 325 MG PO TABS: 650.0000 mg | ORAL_TABLET | Freq: Four times a day (QID) | ORAL | Status: DC | PRN

## 2011-04-26 MED ORDER — ONDANSETRON HCL 4 MG/2ML IJ SOLN
INTRAMUSCULAR | Status: DC | PRN
  Administered 2011-04-26 (×2): 2 mg via INTRAVENOUS

## 2011-04-26 MED ORDER — LACTATED RINGERS IV SOLN
INTRAVENOUS | Status: DC
  Administered 2011-04-27: 05:00:00 via INTRAVENOUS

## 2011-04-26 MED ORDER — LIDOCAINE HCL (CARDIAC) 20 MG/ML IV SOLN
INTRAVENOUS | Status: DC | PRN
  Administered 2011-04-26: 40 mg via INTRAVENOUS

## 2011-04-26 MED ORDER — CLINDAMYCIN PHOSPHATE 600 MG/50ML IV SOLN
600.0000 mg | Freq: Four times a day (QID) | INTRAVENOUS | Status: AC
  Administered 2011-04-26 – 2011-04-27 (×3): 600 mg via INTRAVENOUS
  Filled 2011-04-26 (×3): qty 50

## 2011-04-26 MED ORDER — POLYVINYL ALCOHOL 1.4 % OP SOLN
1.0000 [drp] | Freq: Two times a day (BID) | OPHTHALMIC | Status: DC | PRN
  Filled 2011-04-26: qty 15

## 2011-04-26 MED ORDER — HYDROCODONE-ACETAMINOPHEN 10-325 MG PO TABS
1.0000 | ORAL_TABLET | ORAL | Status: DC | PRN
  Administered 2011-04-26 (×2): 1 via ORAL
  Administered 2011-04-26 – 2011-04-30 (×17): 2 via ORAL
  Filled 2011-04-26 (×6): qty 2
  Filled 2011-04-26: qty 1
  Filled 2011-04-26: qty 2
  Filled 2011-04-26: qty 1
  Filled 2011-04-26: qty 2
  Filled 2011-04-26: qty 1
  Filled 2011-04-26: qty 2
  Filled 2011-04-26: qty 1
  Filled 2011-04-26 (×7): qty 2

## 2011-04-26 MED ORDER — INSULIN ASPART 100 UNIT/ML ~~LOC~~ SOLN
0.0000 [IU] | Freq: Three times a day (TID) | SUBCUTANEOUS | Status: DC
  Administered 2011-04-26 – 2011-04-28 (×6): 3 [IU] via SUBCUTANEOUS
  Administered 2011-04-28 – 2011-04-29 (×3): 2 [IU] via SUBCUTANEOUS
  Administered 2011-04-29 – 2011-04-30 (×2): 3 [IU] via SUBCUTANEOUS

## 2011-04-26 MED ORDER — METHYLCELLULOSE 1 % OP SOLN: 1.0000 [drp] | Freq: Two times a day (BID) | OPHTHALMIC | Status: DC | PRN

## 2011-04-26 MED ORDER — CLINDAMYCIN PHOSPHATE 600 MG/50ML IV SOLN
600.0000 mg | INTRAVENOUS | Status: AC
  Administered 2011-04-26: 600 mg via INTRAVENOUS
  Filled 2011-04-26: qty 50

## 2011-04-26 MED ORDER — METHOCARBAMOL 500 MG PO TABS
500.0000 mg | ORAL_TABLET | Freq: Four times a day (QID) | ORAL | Status: DC | PRN
  Administered 2011-04-27 – 2011-04-30 (×7): 500 mg via ORAL
  Filled 2011-04-26 (×8): qty 1

## 2011-04-26 MED ORDER — CLINDAMYCIN PHOSPHATE 600 MG/50ML IV SOLN
INTRAVENOUS | Status: AC
  Filled 2011-04-26: qty 50

## 2011-04-26 MED ORDER — FLEET ENEMA 7-19 GM/118ML RE ENEM: 1.0000 | ENEMA | Freq: Once | RECTAL | Status: AC | PRN

## 2011-04-26 MED ORDER — SODIUM CHLORIDE 0.9 % IR SOLN
Status: DC | PRN
  Administered 2011-04-26: 09:00:00

## 2011-04-26 MED ORDER — LINAGLIPTIN 5 MG PO TABS
5.0000 mg | ORAL_TABLET | Freq: Every day | ORAL | Status: DC
  Administered 2011-04-27 – 2011-04-30 (×4): 5 mg via ORAL
  Filled 2011-04-26 (×4): qty 1

## 2011-04-26 MED ORDER — PROPOFOL 10 MG/ML IV EMUL
INTRAVENOUS | Status: DC | PRN
  Administered 2011-04-26: 200 mg via INTRAVENOUS

## 2011-04-26 MED ORDER — NEOSTIGMINE METHYLSULFATE 1 MG/ML IJ SOLN
INTRAMUSCULAR | Status: DC | PRN
  Administered 2011-04-26: 5 mg via INTRAVENOUS

## 2011-04-26 MED ORDER — CISATRACURIUM BESYLATE 2 MG/ML IV SOLN
INTRAVENOUS | Status: DC | PRN
  Administered 2011-04-26: 10 mg via INTRAVENOUS

## 2011-04-26 MED ORDER — HEMOSTATIC AGENTS (NO CHARGE) OPTIME
TOPICAL | Status: DC | PRN
  Administered 2011-04-26: 1 via TOPICAL

## 2011-04-26 SURGICAL SUPPLY — 66 items
BAG ZIPLOCK 12X15 (MISCELLANEOUS) ×2 IMPLANT
BANDAGE ELASTIC 4 VELCRO ST LF (GAUZE/BANDAGES/DRESSINGS) ×2 IMPLANT
BANDAGE ELASTIC 6 VELCRO ST LF (GAUZE/BANDAGES/DRESSINGS) ×2 IMPLANT
BANDAGE ESMARK 6X9 LF (GAUZE/BANDAGES/DRESSINGS) ×1 IMPLANT
BANDAGE GAUZE ELAST BULKY 4 IN (GAUZE/BANDAGES/DRESSINGS) ×2 IMPLANT
BLADE SAG 18X100X1.27 (BLADE) ×2 IMPLANT
BLADE SAW SGTL 13.0X1.19X90.0M (BLADE) ×2 IMPLANT
BNDG ESMARK 6X9 LF (GAUZE/BANDAGES/DRESSINGS) ×2
BONE CEMENT GENTAMICIN (Cement) ×4 IMPLANT
CEMENT BONE GENTAMICIN 40 (Cement) ×2 IMPLANT
CLOTH BEACON ORANGE TIMEOUT ST (SAFETY) ×2 IMPLANT
CUFF TOURN SGL QUICK 34 (TOURNIQUET CUFF) ×1
CUFF TRNQT CYL 34X4X40X1 (TOURNIQUET CUFF) ×1 IMPLANT
DRAPE EXTREMITY T 121X128X90 (DRAPE) ×2 IMPLANT
DRAPE INCISE IOBAN 66X45 STRL (DRAPES) ×2 IMPLANT
DRAPE LG THREE QUARTER DISP (DRAPES) ×4 IMPLANT
DRAPE POUCH INSTRU U-SHP 10X18 (DRAPES) ×2 IMPLANT
DRAPE U-SHAPE 47X51 STRL (DRAPES) ×2 IMPLANT
DRSG ADAPTIC 3X8 NADH LF (GAUZE/BANDAGES/DRESSINGS) ×2 IMPLANT
DRSG PAD ABDOMINAL 8X10 ST (GAUZE/BANDAGES/DRESSINGS) ×8 IMPLANT
DURAPREP 26ML APPLICATOR (WOUND CARE) ×2 IMPLANT
ELECT REM PT RETURN 9FT ADLT (ELECTROSURGICAL) ×2
ELECTRODE REM PT RTRN 9FT ADLT (ELECTROSURGICAL) ×1 IMPLANT
EVACUATOR 1/8 PVC DRAIN (DRAIN) ×2 IMPLANT
FACESHIELD LNG OPTICON STERILE (SAFETY) ×12 IMPLANT
FLOSEAL 10ML (HEMOSTASIS) ×2 IMPLANT
GLOVE BIOGEL PI IND STRL 6.5 (GLOVE) ×2 IMPLANT
GLOVE BIOGEL PI IND STRL 8 (GLOVE) ×1 IMPLANT
GLOVE BIOGEL PI IND STRL 8.5 (GLOVE) IMPLANT
GLOVE BIOGEL PI INDICATOR 6.5 (GLOVE) ×2
GLOVE BIOGEL PI INDICATOR 8 (GLOVE) ×1
GLOVE BIOGEL PI INDICATOR 8.5 (GLOVE)
GLOVE ECLIPSE 8.0 STRL XLNG CF (GLOVE) ×4 IMPLANT
GOWN PREVENTION PLUS LG XLONG (DISPOSABLE) ×4 IMPLANT
GOWN STRL REIN XL XLG (GOWN DISPOSABLE) ×2 IMPLANT
HANDPIECE INTERPULSE COAX TIP (DISPOSABLE) ×1
IMMOBILIZER KNEE 20 (SOFTGOODS) ×2
IMMOBILIZER KNEE 20 THIGH 36 (SOFTGOODS) ×1 IMPLANT
KIT BASIN OR (CUSTOM PROCEDURE TRAY) ×2 IMPLANT
MANIFOLD NEPTUNE II (INSTRUMENTS) ×2 IMPLANT
NEEDLE HYPO 22GX1.5 SAFETY (NEEDLE) ×2 IMPLANT
NS IRRIG 1000ML POUR BTL (IV SOLUTION) ×2 IMPLANT
PACK TOTAL JOINT (CUSTOM PROCEDURE TRAY) ×2 IMPLANT
PAD ABD 7.5X8 STRL (GAUZE/BANDAGES/DRESSINGS) IMPLANT
PADDING CAST COTTON 6X4 STRL (CAST SUPPLIES) ×2 IMPLANT
POSITIONER SURGICAL ARM (MISCELLANEOUS) ×2 IMPLANT
SET HNDPC FAN SPRY TIP SCT (DISPOSABLE) ×1 IMPLANT
SET PAD KNEE POSITIONER (MISCELLANEOUS) ×2 IMPLANT
SPONGE GAUZE 4X4 12PLY (GAUZE/BANDAGES/DRESSINGS) ×2 IMPLANT
SPONGE LAP 18X18 X RAY DECT (DISPOSABLE) ×2 IMPLANT
SPONGE SURGIFOAM ABS GEL 100 (HEMOSTASIS) ×2 IMPLANT
STAPLER VISISTAT 35W (STAPLE) IMPLANT
SUCTION FRAZIER 12FR DISP (SUCTIONS) ×2 IMPLANT
SUT BONE WAX W31G (SUTURE) ×2 IMPLANT
SUT VIC AB 0 CT1 27 (SUTURE)
SUT VIC AB 0 CT1 27XBRD ANTBC (SUTURE) IMPLANT
SUT VIC AB 1 CT1 27 (SUTURE) ×3
SUT VIC AB 1 CT1 27XBRD ANTBC (SUTURE) ×3 IMPLANT
SUT VIC AB 2-0 CT1 27 (SUTURE) ×3
SUT VIC AB 2-0 CT1 TAPERPNT 27 (SUTURE) ×3 IMPLANT
SYR 20CC LL (SYRINGE) ×2 IMPLANT
TOWEL OR 17X26 10 PK STRL BLUE (TOWEL DISPOSABLE) ×4 IMPLANT
TOWER CARTRIDGE SMART MIX (DISPOSABLE) ×2 IMPLANT
TRAY FOLEY CATH 14FRSI W/METER (CATHETERS) ×2 IMPLANT
WATER STERILE IRR 1500ML POUR (IV SOLUTION) ×2 IMPLANT
WRAP KNEE MAXI GEL POST OP (GAUZE/BANDAGES/DRESSINGS) ×4 IMPLANT

## 2011-04-26 NOTE — Transfer of Care (Signed)
Immediate Anesthesia Transfer of Care Note  Patient: [name]  Procedure(s) Performed: Procedure(s) (LRB): TOTAL KNEE ARTHROPLASTY (Left)  Patient Location: PACU  Anesthesia Type: General  Level of Consciousness: awake and alert   Airway & Oxygen Therapy: Patient Spontanous Breathing and Patient connected to face mask oxygen  Post-op Assessment: Report given to PACU RN and Post -op Vital signs reviewed and stable  Post vital signs: Reviewed and stable  Complications: No apparent anesthesia complications

## 2011-04-26 NOTE — Progress Notes (Signed)
PORTABLE AP AND LATERAL LEFT KNEE X-RAYS DONE

## 2011-04-26 NOTE — Interval H&P Note (Signed)
History and Physical Interval Note:  04/26/2011 8:15 AM  [name]  has presented today for surgery, with the diagnosis of Osteoarthritis of the Left Knee  The various methods of treatment have been discussed with the patient and family. After consideration of risks, benefits and other options for treatment, the patient has consented to  Procedure(s) (LRB): TOTAL KNEE ARTHROPLASTY (Left) as a surgical intervention .  The patients' history has been reviewed, patient examined, no change in status, stable for surgery.  I have reviewed the patients' chart and labs.  Questions were answered to the patient's satisfaction.     [name] A

## 2011-04-26 NOTE — Brief Op Note (Signed)
04/26/2011  10:39 AM  PATIENT:  [name]  66 y.o. male  PRE-OPERATIVE DIAGNOSIS:  Osteoarthritis of the Left Knee  POST-OPERATIVE DIAGNOSIS:  Osteoarthritis of the Left Knee  PROCEDURE:  Procedure(s) (LRB): TOTAL KNEE ARTHROPLASTY (Left)  SURGEON:  Surgeon(s) and Role:    * Jacki Cones, MD - Primary  PHYSICIAN ASSISTANT:Amber Constable PA     ANESTHESIA:   general  EBL:  Total I/O In: 1000 [I.V.:1000] Out: 200 [Urine:75; Blood:125]  BLOOD ADMINISTERED:none  DRAINS: (1) Hemovact drain(s) in the left knee with  Suction Open   LOCAL MEDICATIONS USED:  BUPIVICAINE 20cc mixed with 20cc Normal Saline   SPECIMEN:  No Specimen  DISPOSITION OF SPECIMEN:  N/A  COUNTS:  YES  TOURNIQUET:   Total Tourniquet Time Documented: Thigh (Left) - 94 minutes  DICTATION: .Other Dictation: Dictation Number [id number]  PLAN OF CARE: Admit to inpatient   PATIENT DISPOSITION:  PACU - hemodynamically stable.   Delay start of Pharmacological VTE agent (>24hrs) due to surgical blood loss or risk of bleeding: yes

## 2011-04-26 NOTE — Progress Notes (Signed)
Dr. Okey Dupre made aware of sleep apnea screening score.

## 2011-04-26 NOTE — Progress Notes (Signed)
Utilization review completed.  

## 2011-04-26 NOTE — Progress Notes (Signed)
Dr. Okey Dupre made aware of patient's blood pressures in PACU  AND CBG results.

## 2011-04-26 NOTE — Anesthesia Preprocedure Evaluation (Addendum)
Anesthesia Evaluation  Patient identified by MRN, date of birth, ID band Patient awake    Reviewed: Allergy & Precautions, H&P , NPO status , Patient's Chart, lab work & pertinent test results  Airway Mallampati: II TM Distance: <3 FB Neck ROM: Full    Dental No notable dental hx. (+) Missing and Edentulous Upper   Pulmonary neg pulmonary ROS,  breath sounds clear to auscultation  Pulmonary exam normal       Cardiovascular negative cardio ROS  Rhythm:Regular Rate:Normal     Neuro/Psych negative neurological ROS  negative psych ROS   GI/Hepatic negative GI ROS, Neg liver ROS,   Endo/Other  Diabetes mellitus-, Type obesity  Renal/GU negative Renal ROS  negative genitourinary   Musculoskeletal negative musculoskeletal ROS (+)   Abdominal   Peds negative pediatric ROS (+)  Hematology negative hematology ROS (+)   Anesthesia Other Findings   Reproductive/Obstetrics negative OB ROS                          Anesthesia Physical Anesthesia Plan  ASA: II  Anesthesia Plan: General   Post-op Pain Management:    Induction: Intravenous  Airway Management Planned: Oral ETT  Additional Equipment:   Intra-op Plan:   Post-operative Plan: Extubation in OR  Informed Consent: I have reviewed the patients History and Physical, chart, labs and discussed the procedure including the risks, benefits and alternatives for the proposed anesthesia with the patient or authorized representative who has indicated his/her understanding and acceptance.   Dental advisory given  Plan Discussed with: CRNA  Anesthesia Plan Comments:         Anesthesia Quick Evaluation

## 2011-04-26 NOTE — Progress Notes (Signed)
X-RAY RESULTS NOTED. 

## 2011-04-26 NOTE — Anesthesia Postprocedure Evaluation (Signed)
  Anesthesia Post-op Note  Patient: [name]  Procedure(s) Performed: Procedure(s) (LRB): TOTAL KNEE ARTHROPLASTY (Left)  Patient Location: PACU  Anesthesia Type: General  Level of Consciousness: awake and alert   Airway and Oxygen Therapy: Patient Spontanous Breathing  Post-op Pain: mild  Post-op Assessment: Post-op Vital signs reviewed, Patient's Cardiovascular Status Stable, Respiratory Function Stable, Patent Airway and No signs of Nausea or vomiting  Post-op Vital Signs: stable  Complications: No apparent anesthesia complications

## 2011-04-27 LAB — BASIC METABOLIC PANEL
BUN: 11 mg/dL (ref 6–23)
Calcium: 8.7 mg/dL (ref 8.4–10.5)
Chloride: 100 mEq/L (ref 96–112)
Creatinine, Ser: 0.9 mg/dL (ref 0.50–1.35)
GFR calc Af Amer: >90 mL/min (ref >90)
GFR calc non Af Amer: 86 mL/min — ABNORMAL LOW (ref >90)

## 2011-04-27 LAB — GLUCOSE, CAPILLARY: Glucose-Capillary: 163 mg/dL — ABNORMAL HIGH (ref 70–99)

## 2011-04-27 LAB — CBC
HCT: 32.3 % — ABNORMAL LOW (ref 39.0–52.0)
MCH: 28.7 pg (ref 26.0–34.0)
MCHC: 32.2 g/dL (ref 30.0–36.0)
MCV: 89 fL (ref 78.0–100.0)
Platelets: 173 10*3/uL (ref 150–400)
RDW: 15.1 % (ref 11.5–15.5)
WBC: 10.4 10*3/uL (ref 4.0–10.5)

## 2011-04-27 NOTE — Progress Notes (Signed)
Subjective: Doing well. Small blister around wound. Dressing changed and hemovac Dcd. Willambulate today.   Objective: Vital signs in last 24 hours: Temp:  [97 F (36.1 C)-100.3 F (37.9 C)] 98.8 F (37.1 C) (04/18 0535) Pulse Rate:  [82-110] 102  (04/18 0535) Resp:  [11-20] 18  (04/18 0535) BP: (116-199)/(56-118) 116/66 mmHg (04/18 0535) SpO2:  [94 %-100 %] 95 % (04/18 0535) Weight:  [107.956 kg (238 lb)] 107.956 kg (238 lb) (04/17 1459)  Intake/Output from previous day: 04/17 0701 - 04/18 0700 In: 3800 [P.O.:1200; I.V.:2450; IV Piggyback:150] Out: 2360 [Urine:1925; Drains:310; Blood:125] Intake/Output this shift:     Basename 04/27/11 0410  HGB 10.4*    Basename 04/27/11 0410  WBC 10.4  RBC 3.63*  HCT 32.3*  PLT 173    Basename 04/27/11 0410  NA 133*  K 4.1  CL 100  CO2 26  BUN 11  CREATININE 0.90  GLUCOSE 179*  CALCIUM 8.7   No results found for this basename: LABPT:2,INR:2 in the last 72 hours  Neurovascular intact Dorsiflexion/Plantar flexion intact No cellulitis present  Assessment/Plan: Ambulate today.   Helio Lack A 04/27/2011, 7:14 AM

## 2011-04-27 NOTE — Progress Notes (Signed)
04/27/11 1300  PT Visit Information  Last PT Received On 04/27/11  Precautions  Precautions Knee  Precaution Comments no pillow under knee  Required Braces or Orthoses Knee Immobilizer - Left  Knee Immobilizer - Left Discontinue once straight leg raise with < 10 degree lag  Restrictions  LLE Weight Bearing WBAT  Bed Mobility  Sit to Supine 4: Min assist;HOB flat  Sit to Supine - Details (indicate cue type and reason) cues for positioning, assist with LLE  Transfers  Sit to Stand 4: Min assist;From chair/3-in-1;With armrests;With upper extremity assist  Sit to Stand Details (indicate cue type and reason) cues for hand placement and LLE positon  Stand to Sit 4: Min assist;To chair/3-in-1;With armrests;With upper extremity assist;To bed  Stand to Sit Details cues for hand placement and LE position  Ambulation/Gait  Ambulation/Gait Assistance 4: Min assist  Ambulation/Gait Assistance Details (indicate cue type and reason) cues for sequence and wt shift, Rw distance from self  Ambulation Distance (Feet) 50 Feet (10)  Assistive device Rolling walker  Gait Pattern Step-to pattern;Antalgic  Total Joint Exercises  Ankle Circles/Pumps AROM;Both;10 reps  Quad Sets AROM;10 reps;Left  Heel Slides AAROM;Left;10 reps  Straight Leg Raises AAROM;Left;5 reps  PT - End of Session  Equipment Utilized During Treatment Left knee immobilizer  Activity Tolerance Patient limited by pain;Patient limited by fatigue  Patient left with call bell in reach;in bed  Nurse Communication Mobility status for transfers;Mobility status for ambulation  General  Behavior During Session [facility] for tasks performed  [facility] for tasks performed  PT - Assessment/Plan  Comments on Treatment Session pt feeling better this pm, pain still 7/10 but better per pt; premedicated  PT Plan Discharge plan remains appropriate;Frequency remains appropriate  PT Frequency 7X/week  Follow Up Recommendations Home health  PT;Supervision - Intermittent  Equipment Recommended Rolling walker with 5" wheels  Acute Rehab PT Goals  Time For Goal Achievement 7 days  Pt will go Sit to Supine/Side with supervision  PT Goal: Sit to Supine/Side - Progress Progressing toward goal  Pt will go Sit to Stand with supervision  PT Goal: Sit to Stand - Progress Progressing toward goal  Pt will go Stand to Sit with supervision  PT Goal: Stand to Sit - Progress Progressing toward goal  Pt will Ambulate 51 - 150 feet;with rolling walker;with supervision  PT Goal: Ambulate - Progress Progressing toward goal  Pt will Perform Home Exercise Program with supervision, verbal cues required/provided;with min assist  PT Goal: Perform Home Exercise Program - Progress Progressing toward goal

## 2011-04-27 NOTE — Op Note (Signed)
NAMEJAISON, PETRAGLIA NO.:  [id number]  MEDICAL RECORD NO.:  [id number]  LOCATION:  1602                         FACILITY:  [facility]  PHYSICIAN:  Georges Lynch. Adonys Wildes, M.D.DATE OF BIRTH:  27-Mar-1945  DATE OF PROCEDURE: DATE OF DISCHARGE:                              OPERATIVE REPORT   PREOPERATIVE DIAGNOSIS:  Severe osteoarthritis, bone-on-bone involving the left knee; had previous arthroscopic surgery, which showed the severe changes.  POSTOPERATIVE DIAGNOSES: 1. Severe osteoarthritis, bone-on-bone involving the left knee; had     previous arthroscopic surgery, which showed the severe changes. 2. Flexion contracture, left knee.  OPERATION:  Left total knee arthroplasty utilizing DePuy system.  All 3 components were cemented and I utilized gentamicin in the cement.  The femur was a size 5 left posterior cruciate sacrificing femur.  Tibial tray was a size 4.  The insert was a size 5, 12.5 mm thickness.  Patella was a size 41.  DESCRIPTION OF PROCEDURE:  Under general anesthesia, routine orthopedic prep and draping of left lower extremity was carried out.  He had 600 mg of clindamycin IV.  Prior to doing any surgery, appropriate time-out was carried out.  Also I marked the appropriate left leg in the holding area.  At this time, the leg was exsanguinated with Esmarch.  Tourniquet was elevated to 325 mmHg.  Following that, the leg was placed into Mayo knee holder.  Knee was flexed.  In an anterior approach, the knee was carried out.  Bleeders identified and cauterized.  Two flaps were created.  I then carried out a median parapatellar incision, reflected the patella laterally.  I then did medial and lateral meniscectomies, and excised the anterior and posterior cruciate ligaments.  Great care was taken not to injure the neurovascular structures.  At this time, initial drill hole was made in the intercondylar notch.  I then removed 12 mm thickness off the distal  femur because there was contracture.  At this time, I then measured the femur to be a size 5 left.  I inserted my next jig and carried out anterior-posterior chamfering cuts for a size 5 left femur.  Following that, I prepared the tibia in the usual fashion utilizing intramedullary guide.  Note the canals were both irrigated constantly during the procedure.  I then removed 6 mm thickness off the affected medial side.  He had rather severe collapse of the medial joint space.  Then, after doing this, I released my soft tissue releases and I then inserted my spacers, blocks, and measured for a size 12.5 prosthesis, that is tibial insert.  After doing that, I then cut my keel cut of the tibia in the usual fashion for a size 4 tray.  I then utilized the notch cut of the femur, cut the notch, cut the femur out in usual fashion.  Following that, I inserted my trial components.  At this particular time, I then reduced the knee.  We first tried a 10 mm thickness insert and then finally went for a 12.5 mm thickness insert with the tibia.  I then did my resurfacing procedure on the patella for a size 41 patella.  Three drill holes were made  in the articular surface of the patella.  We then removed all trial components, thoroughly irrigated out the knee.  I injected Exparel 20 cc mixed with 30 cc of normal saline.  We did inject down to soft tissues.  We then cemented all 3 components in simultaneously.  Loose pieces of cement that were later removed.  Once the cement was hardened, I then looked posterior, make sure there were no loose pieces of cement posteriorly, irrigated the area out and I inserted some thrombin-soaked Gelfoam and finally __________ with a 12.5 mm thickness size 5 tibial insert.  The knee was reduced.  We had excellent motion, excellent function, and good stability.  I then inserted a Hemovac drain and closed the knee in layered usual fashion.  Once again __________ searched for  loose pieces of cement.  The components were all cemented with gentamicin mixed in the cement.  We then reapproximated all the soft tissue structures after I injected another 10 cc of the mixture of Exparel and normal saline.  Note that we used all but about 10 cc of the mixture, so we used the total of 20 cc of Exparel and 20 cc of normal saline.  The wound was closed in layers in usual fashion.  Sterile dressings were applied.  She had clindamycin 600 mg IV preop.          ______________________________ Georges Lynch. Darrelyn Hillock, M.D.     RAG/MEDQ  D:  04/26/2011  T:  04/27/2011  Job:  [id number]

## 2011-04-27 NOTE — Evaluation (Signed)
Physical Therapy Evaluation Patient Details Name: [name] MRN: [id number] DOB: 05/28/1945 Today's Date: 04/27/2011  Problem List:  Patient Active Problem List  Diagnoses  . Osteoarthritis, knee    Past Medical History:  Past Medical History  Diagnosis Date  . Arthritis   . Diabetes mellitus   . Cancer     L EYE - MELANOMA   Past Surgical History:  Past Surgical History  Procedure Date  . Knee arthroscopy 08/2010  . 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 Eye surgery     RETINA SURG RT EYE  . Tumor removed     RT LEG  - FATTY TUMOR    PT Assessment/Plan/Recommendation PT Assessment Clinical Impression Statement: pt s/p LTKA and will benefit from PT to maximize independence for home setting; willhave 24/7 assist for 1-2 days if needed PT Recommendation/Assessment: Patient will need skilled PT in the acute care venue PT Problem List: Decreased strength;Decreased range of motion;Decreased activity tolerance;Decreased mobility;Pain;Decreased knowledge of precautions;Decreased knowledge of use of DME PT Therapy Diagnosis : Difficulty walking PT Plan PT Frequency: 7X/week PT Treatment/Interventions: Therapeutic activities;Therapeutic exercise;Functional mobility training;Stair training;Gait training;DME instruction;Patient/family education PT Recommendation Follow Up Recommendations: Home health PT;Supervision - Intermittent Equipment Recommended: Rolling walker with 5" wheels PT Goals  Acute Rehab PT Goals PT Goal Formulation: With patient Time For Goal Achievement: 7 days Pt will go Supine/Side to Sit: with supervision PT Goal: Supine/Side to Sit - Progress: Goal set today Pt will go Sit to Supine/Side: with supervision PT Goal: Sit to Supine/Side - Progress: Goal set today Pt will go Sit to Stand: with supervision PT Goal: Sit to Stand - Progress: Goal set today Pt will go Stand to Sit: with supervision PT Goal: Stand to Sit - Progress: Goal set  today Pt will Ambulate: 51 - 150 feet;with rolling walker;with supervision PT Goal: Ambulate - Progress: Goal set today Pt will Go Up / Down Stairs: 1-2 stairs;with min assist;with least restrictive assistive device PT Goal: Up/Down Stairs - Progress: Goal set today Pt will Perform Home Exercise Program: with supervision, verbal cues required/provided;with min assist PT Goal: Perform Home Exercise Program - Progress: Goal set today  PT Evaluation Precautions/Restrictions  Precautions Precautions: Knee Precaution Comments: no pillow under knee Required Braces or Orthoses: Knee Immobilizer - Left Knee Immobilizer - Left: Discontinue once straight leg raise with < 10 degree lag Prior Functioning  Home Living Lives With: Alone Available Help at Discharge: Family;Friend(s) (stay 1-2 days 24/7) Type of Home: House Home Access: Stairs to enter Secretary/administrator of Steps: 1 Home Layout: One level Home Adaptive Equipment: None Prior Function Level of Independence: Independent Driving: No Cognition Cognition Overall Cognitive Status: Appears within functional limits for tasks assessed/performed Arousal/Alertness: Awake/alert Orientation Level: Appears intact for tasks assessed Behavior During Session: [facility] for tasks performed Sensation/Coordination   Extremity Assessment RUE Assessment RUE Assessment: Within Functional Limits LUE Assessment LUE Assessment: Within Functional Limits RLE Assessment RLE Assessment: Within Functional Limits LLE Assessment LLE Assessment:  (ankle WFL; unable to assist with SLR d/t pain) Mobility (including Balance) Bed Mobility Supine to Sit: 4: Min assist Supine to Sit Details (indicate cue type and reason): min with LLE, cues for technique Sitting - Scoot to Edge of Bed: 4: Min assist Transfers Sit to Stand: 4: Min assist;From bed;With upper extremity assist Sit to Stand Details (indicate cue type and reason): cues for hand placement and LLE  positon Stand to Sit: 4: Min  assist;To chair/3-in-1;With upper extremity assist;With armrests Stand to Sit Details: cues for LLE position and hand placement Ambulation/Gait Ambulation/Gait Assistance: 4: Min assist Ambulation/Gait Assistance Details (indicate cue type and reason): cues for sequence and wt shift, Rw distance from self Ambulation Distance (Feet): 40 Feet Assistive device: Rolling walker Gait Pattern: Step-to pattern;Antalgic    Exercise  Total Joint Exercises Ankle Circles/Pumps: AROM;Both;10 reps Quad Sets: AROM;Left;5 reps Heel Slides: AAROM;Left;5 reps End of Session PT - End of Session Equipment Utilized During Treatment: Left knee immobilizer Activity Tolerance: Patient limited by pain;Patient limited by fatigue Patient left: in chair;with call bell in reach Nurse Communication: Mobility status for transfers;Mobility status for ambulation General Behavior During Session: [facility] for tasks performed Cognition: [facility] for tasks performed  [facility] 04/27/2011, 11:26 AM

## 2011-04-27 NOTE — Evaluation (Signed)
Occupational Therapy Evaluation Patient Details Name: [name] MRN: [id number] DOB: 01-17-1945 Today's Date: 04/27/2011  Problem List:  Patient Active Problem List  Diagnoses  . Osteoarthritis, knee    Past Medical History:  Past Medical History  Diagnosis Date  . Arthritis   . Diabetes mellitus   . Cancer     L EYE - MELANOMA   Past Surgical History:  Past Surgical History  Procedure Date  . Knee arthroscopy 08/2010  . 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 Eye surgery     RETINA SURG RT EYE  . Tumor removed     RT LEG  - FATTY TUMOR    OT Assessment/Plan/Recommendation OT Assessment Clinical Impression Statement: Pt doing well POD1 LTKR. Skilled OT recommended to maximize I w/BADLs to supervision level in prep for safe d/c home with prn A OT Recommendation/Assessment: Patient will need skilled OT in the acute care venue OT Problem List: Decreased activity tolerance;Decreased safety awareness;Decreased knowledge of use of DME or AE;Pain OT Therapy Diagnosis : Generalized weakness OT Plan OT Frequency: Min 2X/week OT Treatment/Interventions: Self-care/ADL training;Therapeutic activities;DME and/or AE instruction;Patient/family education OT Recommendation Follow Up Recommendations: No OT follow up Equipment Recommended: 3 in 1 bedside comode Individuals Consulted Consulted and Agree with Results and Recommendations: Patient OT Goals Acute Rehab OT Goals OT Goal Formulation: With patient Time For Goal Achievement: 2 weeks ADL Goals Pt Will Perform Grooming: with supervision;Standing at sink (X 3 tasks to improve standing activity tolerance) ADL Goal: Grooming - Progress: Goal set today Pt Will Perform Lower Body Bathing: with supervision;Sit to stand from chair;Sit to stand from bed ADL Goal: Lower Body Bathing - Progress: Goal set today Pt Will Perform Lower Body Dressing: with supervision;Sit to stand from chair;Sit to stand from bed ADL  Goal: Lower Body Dressing - Progress: Goal set today Pt Will Transfer to Toilet: with supervision;Ambulation;3-in-1;Comfort height toilet ADL Goal: Toilet Transfer - Progress: Goal set today Pt Will Perform Toileting - Clothing Manipulation: with supervision;Standing ADL Goal: Toileting - Clothing Manipulation - Progress: Goal set today Pt Will Perform Toileting - Hygiene: with supervision;Sit to stand from 3-in-1/toilet ADL Goal: Toileting - Hygiene - Progress: Goal set today Pt Will Perform Tub/Shower Transfer: Shower transfer;with supervision;Ambulation ADL Goal: Tub/Shower Transfer - Progress: Goal set today  OT Evaluation Precautions/Restrictions  Precautions Precautions: Knee Precaution Comments: no pillow under knee Required Braces or Orthoses: Knee Immobilizer - Left Knee Immobilizer - Left: Discontinue once straight leg raise with < 10 degree lag Restrictions Weight Bearing Restrictions: No Prior Functioning Home Living Lives With: Alone Available Help at Discharge: Family;Friend(s) (stay 1-2 days 24/7) Type of Home: House Home Access: Stairs to enter Secretary/administrator of Steps: 1 Home Layout: One level Bathroom Shower/Tub: Health visitor: Standard Home Adaptive Equipment: None Prior Function Level of Independence: Independent Driving: No  ADL ADL Grooming: Simulated;Minimal assistance Where Assessed - Grooming: Standing at sink Upper Body Bathing: Simulated;Set up Where Assessed - Upper Body Bathing: Sitting, bed;Unsupported Lower Body Bathing: Simulated;Moderate assistance Where Assessed - Lower Body Bathing: Sit to stand from bed Upper Body Dressing: Simulated;Set up Where Assessed - Upper Body Dressing: Sitting, bed;Unsupported Lower Body Dressing: Simulated;Moderate assistance Where Assessed - Lower Body Dressing: Sit to stand from bed Toilet Transfer: Performed;Minimal assistance Toilet Transfer Method: Ambulating Toilet Transfer  Equipment: Raised toilet seat with arms (or 3-in-1 over toilet) Toileting - Clothing Manipulation: Simulated;Minimal assistance Where Assessed - Toileting Clothing Manipulation:  Sit to stand from 3-in-1 or toilet Toileting - Hygiene: Simulated;Minimal assistance Where Assessed - Toileting Hygiene: Sit to stand from 3-in-1 or toilet Tub/Shower Transfer: Not assessed Tub/Shower Transfer Method: Not assessed ADL Comments: Limited eval. Pt fatigued from ambulating with PT. Vision/Perception    Cognition Cognition Overall Cognitive Status: Appears within functional limits for tasks assessed/performed Arousal/Alertness: Awake/alert Orientation Level: Appears intact for tasks assessed Behavior During Session: [facility] for tasks performed Sensation/Coordination   Extremity Assessment RUE Assessment RUE Assessment: Within Functional Limits LUE Assessment LUE Assessment: Within Functional Limits Mobility  Bed Mobility Supine to Sit: 4: Min assist Supine to Sit Details (indicate cue type and reason): min with LLE, cues for technique Sitting - Scoot to Edge of Bed: 4: Min assist Transfers Sit to Stand: 4: Min assist;From chair/3-in-1;With armrests;With upper extremity assist Sit to Stand Details (indicate cue type and reason): cues for hand placement and LE position Stand to Sit: 4: Min assist;With upper extremity assist;With armrests;To bed;To chair/3-in-1 Stand to Sit Details: cues for hand placement and LE position Exercises Total Joint Exercises Ankle Circles/Pumps: AROM;Both;10 reps Quad Sets: AROM;Left;5 reps Heel Slides: AAROM;Left;5 reps End of Session OT - End of Session Activity Tolerance: Patient limited by fatigue;Patient limited by pain Patient left: in bed;with call bell in reach General Behavior During Session: [facility] for tasks performed Cognition: [facility] for tasks performed   Rudie Sermons A, OTR/L [phone] 04/27/2011, 1:56 PM

## 2011-04-28 DIAGNOSIS — D62 Acute posthemorrhagic anemia: Secondary | ICD-10-CM | POA: Diagnosis not present

## 2011-04-28 LAB — CBC
HCT: 30.6 % — ABNORMAL LOW (ref 39.0–52.0)
Hemoglobin: 9.9 g/dL — ABNORMAL LOW (ref 13.0–17.0)
MCH: 28.6 pg (ref 26.0–34.0)
MCV: 88.4 fL (ref 78.0–100.0)
Platelets: 152 10*3/uL (ref 150–400)
RBC: 3.46 MIL/uL — ABNORMAL LOW (ref 4.22–5.81)
WBC: 11.9 10*3/uL — ABNORMAL HIGH (ref 4.0–10.5)

## 2011-04-28 LAB — BASIC METABOLIC PANEL
CO2: 25 mEq/L (ref 19–32)
Calcium: 9.2 mg/dL (ref 8.4–10.5)
Chloride: 96 mEq/L (ref 96–112)
Creatinine, Ser: 0.83 mg/dL (ref 0.50–1.35)
Glucose, Bld: 168 mg/dL — ABNORMAL HIGH (ref 70–99)

## 2011-04-28 LAB — GLUCOSE, CAPILLARY
Glucose-Capillary: 190 mg/dL — ABNORMAL HIGH (ref 70–99)
Glucose-Capillary: 158 mg/dL — ABNORMAL HIGH (ref 70–99)

## 2011-04-28 MED ORDER — OXYCODONE-ACETAMINOPHEN 5-325 MG PO TABS: 1.0000 | ORAL_TABLET | ORAL | Status: AC | PRN

## 2011-04-28 MED ORDER — METHOCARBAMOL 500 MG PO TABS: 500.0000 mg | ORAL_TABLET | Freq: Four times a day (QID) | ORAL | Status: AC | PRN

## 2011-04-28 MED ORDER — RIVAROXABAN 10 MG PO TABS: 10.0000 mg | ORAL_TABLET | Freq: Every day | ORAL | Status: DC

## 2011-04-28 NOTE — Progress Notes (Signed)
Subjective: Multiple Blister Formations on the knee. I sterilized his blisters with Betadine and then perforated them and then dressed with sterile dressings. Good circulation in his foot,but his leg is swollen. Will elevate his leg.   Objective: Vital signs in last 24 hours: Temp:  [98.1 F (36.7 C)-98.8 F (37.1 C)] 98.3 F (36.8 C) (04/19 0625) Pulse Rate:  [92-96] 92  (04/19 0625) Resp:  [18-20] 20  (04/19 0625) BP: (105-153)/(67-80) 150/80 mmHg (04/19 0625) SpO2:  [90 %-95 %] 95 % (04/19 0625)  Intake/Output from previous day: 04/18 0701 - 04/19 0700 In: 2540 [P.O.:2540] Out: 2375 [Urine:2375] Intake/Output this shift:     Basename 04/28/11 0408 04/27/11 0410  HGB 9.9* 10.4*    Basename 04/28/11 0408 04/27/11 0410  WBC 11.9* 10.4  RBC 3.46* 3.63*  HCT 30.6* 32.3*  PLT 152 173    Basename 04/28/11 0408 04/27/11 0410  NA 129* 133*  K 3.9 4.1  CL 96 100  CO2 25 26  BUN 10 11  CREATININE 0.83 0.90  GLUCOSE 168* 179*  CALCIUM 9.2 8.7   No results found for this basename: LABPT:2,INR:2 in the last 72 hours  No cellulitis present Blisters on Knee.  Assessment/Plan: Follow knee Blisters closely. Hbg is 9.9. Plan on DC Sunday.   [name] A 04/28/2011, 7:13 AM

## 2011-04-28 NOTE — Progress Notes (Signed)
CARE MANAGEMENT NOTE 04/28/2011  Patient:  [name], [name]   Account Number:  [id number]  Date Initiated:  04/28/2011  Documentation initiated by:  Colleen Can  Subjective/Objective Assessment:   dx total knee replacemnt     Action/Plan:   CM spoke with patient. Plans are for patient to return to his home in [place] where his friends will be caregivers. He will need RW and 3n1   Anticipated DC Date:  04/29/2011   Anticipated DC Plan:  HOME W HOME HEALTH SERVICES  In-house referral  NA      DC Planning Services  CM consult      [facility] Choice  HOME HEALTH  DURABLE MEDICAL EQUIPMENT   Choice offered to / List presented to:  C-1 Patient   DME arranged  NA      DME agency  NA     HH arranged  HH-2 PT      [facility] agency  [facility]   Status of service:  In process, will continue to follow Medicare Important Message given?   (If response is "NO", the following Medicare IM given date fields will be blank) Comments:  04/28/2011 [name],rn bsn ccm [phone] List of [facility] agencies placed in shadow chart Genevieve Norlander will provide  [facility] services and arrange for RW and 3n1 [facility] services will start day after discharge.

## 2011-04-28 NOTE — Evaluation (Signed)
Physical Therapy Evaluation Patient Details Name: [name] MRN: [id number] DOB: 07-Dec-1945 Today's Date: 04/28/2011 Time: 4098-1191 PT Time Calculation (min): 27 min  PT Assessment / Plan / Recommendation Clinical Impression  pt s/p LTKA and will benefit from PT to maximize independence for home setting; willhave 24/7 assist for 1-2 days if needed    PT Assessment  Patient will need skilled PT in the acute care venue    Follow Up Recommendations  Home health PT;Supervision - Intermittent    Equipment Recommendations   (ordered)    Frequency 7X/week    Precautions / Restrictions Precautions Precautions: Knee Precaution Comments: no pillow under knee Required Braces or Orthoses: Knee Immobilizer - Left Knee Immobilizer - Left: Discontinue once straight leg raise with < 10 degree lag Restrictions Weight Bearing Restrictions: No LLE Weight Bearing: Weight bearing as tolerated   Pertinent Vitals/Pain       Mobility  Bed Mobility Supine to Sit: 4: Min assist Sitting - Scoot to Edge of Bed: 4: Min assist Sit to Supine: 4: Min assist;HOB flat Transfers Sit to Stand: 4: Min guard;From chair/3-in-1;With upper extremity assist;With armrests Stand to Sit: 4: Min assist;To chair/3-in-1;With upper extremity assist;With armrests Details for Transfer Assistance: cues for hand placement and LLE position Ambulation/Gait Ambulation/Gait Assistance: 4: Min guard Ambulation Distance (Feet): 80 Feet Assistive device: Rolling walker Ambulation/Gait Assistance Details: cues for sequence and wt shift, Rw distance from self Gait Pattern: Step-to pattern;Antalgic General Gait Details: able to bear more wt on LLE this am;    Exercises     PT Goals Acute Rehab PT Goals Time For Goal Achievement: 05/02/11 Potential to Achieve Goals: Good Pt will go Sit to Stand: with supervision PT Goal: Sit to Stand - Progress: Progressing toward goal Pt will go Stand to Sit: with  supervision PT Goal: Stand to Sit - Progress: Progressing toward goal Pt will Ambulate: 51 - 150 feet;with rolling walker;with supervision PT Goal: Ambulate - Progress: Progressing toward goal  Visit Information  Last PT Received On: 04/28/11    Subjective Data  Subjective:  I had pain pills Patient Stated Goal: decrease pain with movement   Prior Functioning  Home Living Lives With: Alone Available Help at Discharge: Family;Friend(s) (stay 1-2 days 24/7) Type of Home: House Home Access: Stairs to enter [employer] of Steps: 1 Home Layout: One level Bathroom Shower/Tub: Health visitor: Standard Home Adaptive Equipment: None Prior Function Level of Independence: Independent Driving: No Communication Communication: No difficulties    Cognition  Overall Cognitive Status: Appears within functional limits for tasks assessed/performed Arousal/Alertness: Awake/alert Orientation Level: Oriented X4 / Intact Behavior During Session: [facility] for tasks performed    Extremity/Trunk Assessment     Balance    End of Session PT - End of Session Equipment Utilized During Treatment: Left knee immobilizer Activity Tolerance: Patient tolerated treatment well Patient left: in chair;with call bell/phone within reach   [facility] 04/28/2011, 9:31 AM

## 2011-04-28 NOTE — Progress Notes (Addendum)
Physical Therapy Treatment Patient Details Name: [name] MRN: [id number] DOB: 02-26-1945 Today's Date: 04/28/2011 Time: 1191-4782 PT Time Calculation (min): 32 min  PT Assessment / Plan / Recommendation Comments on Treatment Session  pt progressing well; DME ordered per Genevieve Norlander    Follow Up Recommendations  Home health PT;Supervision - Intermittent    Equipment Recommendations       Frequency 7X/week    Precautions / Restrictions Precautions Precautions: Knee Precaution Comments: no pillow under knee; knee elevated per MD request Required Braces or Orthoses: Knee Immobilizer - Left Knee Immobilizer - Left: Discontinue once straight leg raise with < 10 degree lag Restrictions LLE Weight Bearing: Weight bearing as tolerated   Pertinent Vitals/Pain     Mobility  Bed Mobility Supine to Sit: 4: Min assist Sitting - Scoot to Edge of Bed: 4: Min assist Sit to Supine: 5: Supervision Details for Bed Mobility Assistance: cues fo technique Transfers Sit to Stand: 4: Min guard;From bed;With upper extremity assist Stand to Sit: 4: Min guard;To bed;With upper extremity assist Details for Transfer Assistance: cues for hand placement and LLE position Ambulation/Gait Ambulation/Gait Assistance: 4: Min guard;5: Supervision Ambulation Distance (Feet): 100 Feet Assistive device: Rolling walker Ambulation/Gait Assistance Details: cues for sequence and wt shift, Rw distance from self Gait Pattern: Step-to pattern;Antalgic        PT Goals Acute Rehab PT Goals Pt will go Supine/Side to Sit: with supervision Pt will go Sit to Supine/Side: with supervision PT Goal: Sit to Supine/Side - Progress: Progressing toward goal Pt will go Sit to Stand: with supervision PT Goal: Sit to Stand - Progress: Progressing toward goal Pt will go Stand to Sit: with supervision PT Goal: Stand to Sit - Progress: Progressing toward goal Pt will Ambulate: 51 - 150 feet;with rolling walker;with  supervision PT Goal: Ambulate - Progress: Progressing toward goal Pt will Perform Home Exercise Program: with supervision, verbal cues required/provided;with min assist PT Goal: Perform Home Exercise Program - Progress: Progressing toward goal  Visit Information  Last PT Received On: 04/28/11 Assistance Needed: +1    Subjective Data  Subjective: I'm alright   Cognition  Overall Cognitive Status: Appears within functional limits for tasks assessed/performed Arousal/Alertness: Awake/alert Orientation Level: Oriented X4 / Intact Behavior During Session: [facility] for tasks performed    Balance     End of Session PT - End of Session Equipment Utilized During Treatment: Left knee immobilizer Activity Tolerance: Patient tolerated treatment well Patient left: in chair;with call bell/phone within reach    [facility] 04/28/2011, 1:37 PM

## 2011-04-28 NOTE — Progress Notes (Signed)
OT Cancellation Note  ___Treatment cancelled today due to medical issues with patient which prohibited therapy  ___ Treatment cancelled today due to patient receiving procedure or test   _x__ Treatment cancelled today due to patient's refusal to participate. Will check back another time.   ___ Treatment cancelled today due to   Signature: Garrel Ridgel, OTR/L  Pager [phone] 04/28/2011

## 2011-04-29 LAB — GLUCOSE, CAPILLARY
Glucose-Capillary: 160 mg/dL — ABNORMAL HIGH (ref 70–99)
Glucose-Capillary: 137 mg/dL — ABNORMAL HIGH (ref 70–99)

## 2011-04-29 LAB — CBC
HCT: 27.9 % — ABNORMAL LOW (ref 39.0–52.0)
Hemoglobin: 9.2 g/dL — ABNORMAL LOW (ref 13.0–17.0)
MCH: 28.6 pg (ref 26.0–34.0)
MCHC: 33 g/dL (ref 30.0–36.0)
MCV: 86.6 fL (ref 78.0–100.0)
RDW: 14.6 % (ref 11.5–15.5)

## 2011-04-29 MED ORDER — CEPHALEXIN 500 MG PO CAPS
500.0000 mg | ORAL_CAPSULE | Freq: Four times a day (QID) | ORAL | Status: DC
  Administered 2011-04-29 – 2011-04-30 (×8): 500 mg via ORAL
  Filled 2011-04-29 (×9): qty 1

## 2011-04-29 NOTE — Progress Notes (Signed)
Cm spoke with patient concerning dc planning. Pt confirmed previous dc plan with [place] providing HH services. 3n1 and RW present in room during interview. Per pt, landlord and adult son to assist in home care.   Leonie Green [phone]

## 2011-04-29 NOTE — Progress Notes (Signed)
Physical Therapy Treatment Patient Details Name: [name] MRN: [id number] DOB: 20-Nov-1945 Today's Date: 04/29/2011 Time: 5621-3086 PT Time Calculation (min): 32 min  PT Assessment / Plan / Recommendation Comments on Treatment Session  Pt tolerated increased activity . plans to DC 4/21    Follow Up Recommendations  Home health PT    Equipment Recommendations  Rolling walker with 5" wheels    Frequency 7X/week   Plan Discharge plan remains appropriate    Precautions / Restrictions Precautions Precautions: Knee Required Braces or Orthoses: Knee Immobilizer - Left Knee Immobilizer - Left: Discontinue once straight leg raise with < 10 degree lag Restrictions Weight Bearing Restrictions: No   Pertinent Vitals/Pain 5/10    Mobility  Bed Mobility Supine to Sit: 4: Min guard Sitting - Scoot to Edge of Bed: 4: Min guard Sit to Supine: 5: Supervision Details for Bed Mobility Assistance: pt was able to maneuvre LLE on/off bed Transfers Sit to Stand: 4: Min guard;With upper extremity assist;From bed Stand to Sit: To bed;With upper extremity assist;5: Supervision Details for Transfer Assistance: pt able to step  LLE forward prior to sitting down Ambulation/Gait Ambulation/Gait Assistance: 4: Min guard Ambulation Distance (Feet): 174 Feet Assistive device: Rolling walker Ambulation/Gait Assistance Details: pt able to step through. RW adjusted Gait Pattern: Step-through pattern Gait velocity: WFL General Gait Details: increase in distance Stairs:  (pt declined need to practice)    Exercises Total Joint Exercises Ankle Circles/Pumps: AROM;Both;10 reps Quad Sets: AROM;10 reps;Left Heel Slides: AAROM;Left;10 reps;Supine Hip ABduction/ADduction: AROM;Left;10 reps Straight Leg Raises: AAROM;Left;10 reps   PT Goals Acute Rehab PT Goals PT Goal Formulation: With patient Time For Goal Achievement: 04/30/11 Potential to Achieve Goals: Good Pt will go Supine/Side to Sit:  with supervision PT Goal: Supine/Side to Sit - Progress: Progressing toward goal Pt will go Sit to Supine/Side: with supervision PT Goal: Sit to Supine/Side - Progress: Progressing toward goal Pt will go Sit to Stand: with supervision PT Goal: Sit to Stand - Progress: Progressing toward goal Pt will go Stand to Sit: with supervision PT Goal: Stand to Sit - Progress: Progressing toward goal Pt will Ambulate: 51 - 150 feet;with rolling walker PT Goal: Ambulate - Progress: Progressing toward goal Pt will Go Up / Down Stairs: 1-2 stairs;with min assist;with least restrictive assistive device Pt will Perform Home Exercise Program: with supervision, verbal cues required/provided PT Goal: Perform Home Exercise Program - Progress: Progressing toward goal  Visit Information  Last PT Received On: 04/29/11 Assistance Needed: +1    Subjective Data  Subjective: i did better today   Cognition  Overall Cognitive Status: Appears within functional limits for tasks assessed/performed Arousal/Alertness: Awake/alert Behavior During Session: [facility] for tasks performed    Balance     End of Session PT - End of Session Equipment Utilized During Treatment: Left knee immobilizer Activity Tolerance: Patient tolerated treatment well Patient left: in bed;with call bell/phone within reach    [name] 04/29/2011, 11:49 AM [phone]

## 2011-04-29 NOTE — Progress Notes (Signed)
Physical Therapy Treatment Patient Details Name: [name] MRN: [id number] DOB: 08-03-1945 Today's Date: 04/29/2011 Time: 4098-1191 PT Time Calculation (min): 17 min  PT Assessment / Plan / Recommendation Comments on Treatment Session  pt ambulated w/o KI, tolerated well    Follow Up Recommendations  Home health PT    Equipment Recommendations  Rolling walker with 5" wheels    Frequency 7X/week   Plan Discharge plan remains appropriate    Precautions / Restrictions Precautions Precautions: Knee Required Braces or Orthoses:  (did not use KI this session.) Knee Immobilizer - Left: Discontinue once straight leg raise with < 10 degree lag Restrictions Weight Bearing Restrictions: No   Pertinent Vitals/Pain 6/10 RN notified    Mobility  Bed Mobility Supine to Sit: 5: Supervision;HOB flat Sitting - Scoot to Edge of Bed: 5: Supervision Sit to Supine: 5: Supervision Details for Bed Mobility Assistance: pt was able to maneuvre LLE on/off bed Transfers Transfers: Sit to Stand;Stand to Sit Sit to Stand: 5: Supervision;From bed;With upper extremity assist Stand to Sit: To chair/3-in-1;5: Supervision;With upper extremity assist;With armrests Details for Transfer Assistance: pt mamaging LLE w/ no assistance fror transitions Ambulation/Gait Ambulation/Gait Assistance: 4: Min guard Ambulation Distance (Feet): 175 Feet Assistive device: Rolling walker Ambulation/Gait Assistance Details: pt able to step through. RW adjusted Gait Pattern: Step-through pattern Gait velocity: increased speed General Gait Details: pt did feel slight buckle of L knee during gait w/o KI Stairs:  (pt declined need to practice)    Exercises    PT Goals Acute Rehab PT Goals PT Goal Formulation: With patient Time For Goal Achievement: 04/30/11 Potential to Achieve Goals: Good Pt will go Supine/Side to Sit: with supervision PT Goal: Supine/Side to Sit - Progress: Progressing toward goal Pt will go  Sit to Supine/Side: with supervision PT Goal: Sit to Supine/Side - Progress: Progressing toward goal Pt will go Sit to Stand: with supervision PT Goal: Sit to Stand - Progress: Progressing toward goal Pt will go Stand to Sit: with supervision PT Goal: Stand to Sit - Progress: Progressing toward goal Pt will Ambulate: 51 - 150 feet;with rolling walker PT Goal: Ambulate - Progress: Progressing toward goal Pt will Go Up / Down Stairs: 1-2 stairs;with min assist;with least restrictive assistive device Pt will Perform Home Exercise Program: with supervision, verbal cues required/provided PT Goal: Perform Home Exercise Program - Progress: Progressing toward goal  Visit Information  Last PT Received On: 04/29/11 Assistance Needed: +1    Subjective Data  Subjective: i gave on me a little( re: L Knee w/o KI)   Cognition  Overall Cognitive Status: Appears within functional limits for tasks assessed/performed Arousal/Alertness: Awake/alert Orientation Level: Appears intact for tasks assessed Behavior During Session: [facility] for tasks performed    Balance     End of Session PT - End of Session Equipment Utilized During Treatment: Left knee immobilizer Activity Tolerance: Patient tolerated treatment well Patient left: in bed Nurse Communication: Mobility status    [name] 04/29/2011, 3:36 PM

## 2011-04-29 NOTE — Progress Notes (Signed)
Subjective: Dressing Changed and Blistered areas are clean. Wound siite looks good. Doppler of Dorsalis Pedis Pulse is excellent. Dressing change in A.M. Prior to DC.Will start on Keflex since his area of blisters is superficially open .   Objective: Vital signs in last 24 hours: Temp:  [98.2 F (36.8 C)-98.6 F (37 C)] 98.2 F (36.8 C) (04/20 0522) Pulse Rate:  [89-92] 89  (04/20 0522) Resp:  [18] 18  (04/20 0522) BP: (142-149)/(78-83) 146/83 mmHg (04/20 0522) SpO2:  [96 %-97 %] 96 % (04/20 0522)  Intake/Output from previous day: 04/19 0701 - 04/20 0700 In: 1080 [P.O.:1080] Out: 2100 [Urine:2100] Intake/Output this shift:     Basename 04/29/11 0420 04/28/11 0408 04/27/11 0410  HGB 9.2* 9.9* 10.4*    Basename 04/29/11 0420 04/28/11 0408  WBC 10.6* 11.9*  RBC 3.22* 3.46*  HCT 27.9* 30.6*  PLT 169 152    Basename 04/28/11 0408 04/27/11 0410  NA 129* 133*  K 3.9 4.1  CL 96 100  CO2 25 26  BUN 10 11  CREATININE 0.83 0.90  GLUCOSE 168* 179*  CALCIUM 9.2 8.7   No results found for this basename: LABPT:2,INR:2 in the last 72 hours  Dorsiflexion/Plantar flexion intact Compartment soft Excellent D.Pedis pulse.  Assessment/Plan: Plan on DC in A.M. On Keflex and Xarelto.   Josilynn Losh A 04/29/2011, 7:22 AM

## 2011-04-30 MED ORDER — CEPHALEXIN 500 MG PO CAPS: 500.0000 mg | ORAL_CAPSULE | Freq: Four times a day (QID) | ORAL | Status: AC

## 2011-04-30 NOTE — Progress Notes (Signed)
Physical Therapy Treatment Patient Details Name: [name] MRN: [id number] DOB: 08/01/1945 Today's Date: 04/30/2011 Time: 4098-1191 PT Time Calculation (min): 21 min  PT Assessment / Plan / Recommendation Comments on Treatment Session  pt is ready for DC today has DME    Follow Up Recommendations  Home health PT    Equipment Recommendations  Rolling walker with 5" wheels    Frequency     Plan Discharge plan remains appropriate    Precautions / Restrictions Precautions Precautions: Knee Required Braces or Orthoses: Knee Immobilizer - Left Knee Immobilizer - Left: Discontinue once straight leg raise with < 10 degree lag Restrictions Weight Bearing Restrictions: No   Pertinent Vitals/Pain %/10 has been medicated    Mobility  Bed Mobility Bed Mobility: Supine to Sit Supine to Sit: 5: Supervision;HOB flat Sitting - Scoot to Edge of Bed: 5: Supervision Details for Bed Mobility Assistance: pt is controlling LLE throughout all mobility Transfers Transfers: Sit to Stand;Stand to Sit Sit to Stand: 5: Supervision;From bed;With upper extremity assist Stand to Sit: To chair/3-in-1;5: Supervision;With upper extremity assist;With armrests Details for Transfer Assistance: pt mamaging LLE w/ no assistance fror transitions Ambulation/Gait Ambulation/Gait Assistance: 4: Min guard Ambulation Distance (Feet): 175 Feet Assistive device: Rolling walker Ambulation/Gait Assistance Details: pt w/ increased speed of ambulation Gait Pattern: Step-through pattern Gait velocity: WFL for RW General Gait Details: pt improved, steady w/ gait.  Stairs: No (pt declined)    Exercises     PT Goals Acute Rehab PT Goals PT Goal Formulation: With patient Time For Goal Achievement: 04/30/11 Potential to Achieve Goals: Good Pt will go Supine/Side to Sit: with supervision PT Goal: Supine/Side to Sit - Progress: Met Pt will go Sit to Supine/Side: with supervision PT Goal: Sit to Supine/Side -  Progress: Progressing toward goal Pt will go Sit to Stand: with supervision PT Goal: Sit to Stand - Progress: Met Pt will go Stand to Sit: with supervision PT Goal: Stand to Sit - Progress: Met Pt will Ambulate: 51 - 150 feet;with supervision;with rolling walker PT Goal: Ambulate - Progress: Met PT Goal: Up/Down Stairs - Progress:  (pt declined need to practice) Pt will Perform Home Exercise Program: with supervision, verbal cues required/provided PT Goal: Perform Home Exercise Program - Progress: Progressing toward goal  Visit Information  Last PT Received On: 04/30/11 Assistance Needed: +1    Subjective Data  Subjective: i am ready to go home   Cognition  Overall Cognitive Status: Appears within functional limits for tasks assessed/performed Arousal/Alertness: Awake/alert Orientation Level: Appears intact for tasks assessed Behavior During Session: [facility] for tasks performed    Balance     End of Session PT - End of Session Equipment Utilized During Treatment: Left knee immobilizer Activity Tolerance: Patient tolerated treatment well Patient left: in chair;with call bell/phone within reach Nurse Communication: Mobility status    [name] 04/30/2011, 12:21 PM

## 2011-04-30 NOTE — Discharge Instructions (Signed)
Knee Rehabilitation, Guidelines Following Surgery Results after knee surgery are often greatly improved when you follow the exercise, range of motion and muscle strengthening exercises prescribed by your doctor. Safety measures are also important to protect the knee from further injury. Any time any of these exercises cause you to have increased pain or swelling in your knee joint, decrease the amount until you are comfortable again and slowly increase them. If you have problems or questions, call your caregiver or physical therapist for advice. HOME CARE INSTRUCTIONS   Remove items at home which could result in a fall. This includes throw rugs or furniture in walking pathways.   Continue medications as instructed.   You may shower or take tub baths when your staples or stitches are removed or as instructed.   Walk using crutches or walker as instructed.   Put weight on your legs and walk as much as is comfortable.   You may resume a sexual relationship in one month or when given the OK by your doctor.   Return to work as instructed by your doctor.   Do not drive a car for 6 weeks or as instructed.   Wear elastic stockings until instructed not to.   Make sure you keep all of your appointments after your operation with all of your doctors and caregivers.  RANGE OF MOTION AND STRENGTHENING EXERCISES Rehabilitation of the knee is important following a knee injury or an operation. After just a few days of immobilization, the muscles of the thigh which control the knee become weakened and shrink (atrophy). Knee exercises are designed to build up the tone and strength of the thigh muscles and to improve knee motion. Often times heat used for twenty to thirty minutes before working out will loosen up your tissues and help with improving the range of motion. These exercises can be done on a training (exercise) mat, on the floor, on a table or on a bed. Use what ever works the best and is most  comfortable for you Knee exercises include:  Leg Lifts - While your knee is still immobilized in a splint or cast, you can do straight leg raises. Lift the leg to 60 degrees, hold for 3 sec, and slowly lower the leg. Repeat 10-20 times 2-3 times daily. Perform this exercise against resistance later as your knee gets better.   Quad and Hamstring Sets - Tighten up the muscle on the front of the thigh (Quad) and hold for 5-10 sec. Repeat this 10-20 times hourly. Hamstring sets are done by pushing the foot backward against an object and holding for 5-10 sec. Repeat as with quad sets.  A rehabilitation program following serious knee injuries can speed recovery and prevent re-injury in the future due to weakened muscles. Contact your doctor or a physical therapist for more information on knee rehabilitation. MAKE SURE YOU:   Understand these instructions.   Will watch your condition.   Will get help right away if you are not doing well or get worse.  Document Released: 12/26/2004 Document Revised: 12/15/2010 Document Reviewed: 06/15/2006 [facility] Patient Information 2012 [place], [place].  Pick up stool softner and laxative for home. Do not submerge incision under water. May shower. Continue to use ice for pain and swelling from surgery.  Take Xarelto for two and a half more weeks, then discontinue Xarelto.

## 2011-04-30 NOTE — Progress Notes (Signed)
Cm spoke with pt concerning need for [facility] for dressing changes. Per pt choice [facility] to provide HHRN/HHPT. RN to enter vo with MD permission. Pt's dc plan remains the same. Lanell Matar to provide transportation home. Dme for home use present at bedside.    Leonie Green [phone]

## 2011-04-30 NOTE — Progress Notes (Signed)
Subjective: 4 Days Post-Op Procedure(s) (LRB): TOTAL KNEE ARTHROPLASTY (Left) Patient reports pain as mild.   Patient doing OK.  Ready to go home.  Will follow up with Dr. Darrelyn Hillock.   Objective: Vital signs in last 24 hours: Temp:  [98.3 F (36.8 C)-98.4 F (36.9 C)] 98.4 F (36.9 C) (04/21 0548) Pulse Rate:  [87-93] 87  (04/21 0548) Resp:  [18-20] 20  (04/21 0548) BP: (143-166)/(72-80) 166/78 mmHg (04/21 0548) SpO2:  [97 %-99 %] 97 % (04/21 0548)  Intake/Output from previous day:  Intake/Output Summary (Last 24 hours) at 04/30/11 0905 Last data filed at 04/30/11 0830  Gross per 24 hour  Intake   1080 ml  Output   1650 ml  Net   -570 ml    Intake/Output this shift: Total I/O In: 240 [P.O.:240] Out: 300 [Urine:300]  Labs:  [facility] 04/29/11 0420 04/28/11 0408  HGB 9.2* 9.9*    Basename 04/29/11 0420 04/28/11 0408  WBC 10.6* 11.9*  RBC 3.22* 3.46*  HCT 27.9* 30.6*  PLT 169 152    Basename 04/28/11 0408  NA 129*  K 3.9  CL 96  CO2 25  BUN 10  CREATININE 0.83  GLUCOSE 168*  CALCIUM 9.2   No results found for this basename: LABPT:2,INR:2 in the last 72 hours  Exam: Neurovascular intact Sensation intact distally Incision - incision looks OK, lateral blister ruptured and covered with Tegaderm, medial blisters - two with erythematous base, covered with Xeroform, rewrapped. Motor function intact - moving foot and toes well on exam.   Assessment/Plan: 4 Days Post-Op Procedure(s) (LRB): TOTAL KNEE ARTHROPLASTY (Left) Procedure(s) (LRB): TOTAL KNEE ARTHROPLASTY (Left) Past Medical History  Diagnosis Date  . Arthritis   . Diabetes mellitus   . Cancer     L EYE - MELANOMA   Active Problems:  Osteoarthritis, knee  Acute blood loss anemia   Discharge home with home health Diet - carb modified - medium Follow up - in 2 weeks Activity - WBAT Disposition - Home Condition Upon Discharge - Stable D/C Meds - See DC Summary DVT Prophylaxis -  Xarelto  Aulden Calise 04/30/2011, 9:05 AM

## 2011-04-30 NOTE — Progress Notes (Signed)
Physical Therapy Treatment Patient Details Name: [name] MRN: [id number] DOB: 01-29-1945 Today's Date: 04/30/2011 Time: 7829-5621 PT Time Calculation (min): 15 min  PT Assessment / Plan / Recommendation Comments on Treatment Session  pt  will rest and then get up to walk in a few minutes. pt can almost perform a SLR    Follow Up Recommendations  Home health PT    Equipment Recommendations  Rolling walker with 5" wheels    Frequency 7X/week   Plan Discharge plan remains appropriate    Precautions / Restrictions Precautions Precautions: Knee Required Braces or Orthoses: Knee Immobilizer - Left Knee Immobilizer - Left: Discontinue once straight leg raise with < 10 degree lag Restrictions Weight Bearing Restrictions: No   Pertinent Vitals/Pain 5/10    Mobility    Exercises Total Joint Exercises Ankle Circles/Pumps: AROM;Both;10 reps Quad Sets: AROM;10 reps;Left Short Arc Quad: AAROM;Left;10 reps Heel Slides: AAROM;Left;10 reps;Supine Hip ABduction/ADduction: AROM;Left;10 reps Straight Leg Raises: AAROM;Left;10 reps   PT Goals Acute Rehab PT Goals PT Goal Formulation: With patient Time For Goal Achievement: 04/30/11 Potential to Achieve Goals: Good Pt will go Supine/Side to Sit: with supervision PT Goal: Supine/Side to Sit - Progress: Met Pt will go Sit to Supine/Side: with supervision PT Goal: Sit to Supine/Side - Progress: Progressing toward goal Pt will go Sit to Stand: with supervision PT Goal: Sit to Stand - Progress: Met Pt will go Stand to Sit: with supervision PT Goal: Stand to Sit - Progress: Met Pt will Ambulate: 51 - 150 feet;with supervision;with rolling walker PT Goal: Ambulate - Progress: Met PT Goal: Up/Down Stairs - Progress:  (pt declined need to practice) Pt will Perform Home Exercise Program: with supervision, verbal cues required/provided PT Goal: Perform Home Exercise Program - Progress: Progressing toward goal  Visit Information  Last PT Received On: 04/30/11 Assistance Needed: +1    Subjective Data  Subjective: ready for exercises   Cognition  Overall Cognitive Status: Appears within functional limits for tasks assessed/performed Arousal/Alertness: Awake/alert Orientation Level: Appears intact for tasks assessed Behavior During Session: [facility] for tasks performed    Balance     End of Session PT - End of Session Equipment Utilized During Treatment: Left knee immobilizer Activity Tolerance: Patient tolerated treatment well Patient left: in bed Nurse Communication: Mobility status    [name] 04/30/2011, 12:24 PM

## 2011-04-30 NOTE — Progress Notes (Signed)
Writer received call from [facility] pharmacy where pt has attempted to fill Rx for Robaxin. This med is not covered by his Financial controller. Cala Bradford, the pharmacist there suggested Flexaril . Called Avel Peace, PAc who ordered flexaril 10 mg po Tid prn #[phone]. She questioned refills. I made her aware that he would be seeing MD in 14 days and could address that at that time.

## 2011-04-30 NOTE — Progress Notes (Signed)
Discharged to family auto via w/c. Assessment unchanged from am. 

## 2011-05-01 NOTE — Discharge Summary (Signed)
Physician Discharge Summary   Patient ID: [name] MRN: [id number] DOB/AGE: 1945-04-09 66 y.o.  Admit date: 04/26/2011 Discharge date: 05/01/2011  Primary Diagnosis:  Osteoarthritis, left knee  Admission Diagnoses:  Past Medical History  Diagnosis Date  . Arthritis   . Diabetes mellitus   . Cancer     L EYE - MELANOMA   Discharge Diagnoses:   Active Problems:  Osteoarthritis, knee  Acute blood loss anemia S/P left total knee arthroplasty   Procedure:  Procedure(s) (LRB): TOTAL KNEE ARTHROPLASTY (Left)   Consults: none  HPI: The patient is a 66 year old male who presented to Dr. Jeannetta Stevenson office in July 2012 with left knee complaints of pain and instability. MRI showed that he had has medial and lateral meniscus tears in the left knee. He underwent a menisectomy in the left knee arthroscopically. The scope procedure also revealed bone on bone degenerative changes in the medial compartment and early arthritic changes in the patellofemoral compartment. After recovery, he continued to have pain. He had cortisone injections as well as Hyalgan injections which provided minimal relief. Due to failure of conservative measures, most predictable means for decreased pain and increased function is a left total knee arthroplasty     Laboratory Data: Hospital Outpatient Visit on 04/18/2011  Component Date Value Range Status  . MRSA, PCR  04/18/2011 NEGATIVE  NEGATIVE Final  . Staphylococcus aureus  04/18/2011 POSITIVE* NEGATIVE Final   Comment:                                 The Xpert SA Assay (FDA                          approved for NASAL specimens                          only), is one component of                          a comprehensive surveillance                          program.  It is not intended                          to diagnose infection nor to                          guide or monitor treatment.  Marland Kitchen aPTT (seconds) 04/18/2011 33  24-37 Final  . WBC (K/uL)  04/18/2011 5.2  4.0-10.5 Final  . RBC (MIL/uL) 04/18/2011 5.07  4.22-5.81 Final  . Hemoglobin (g/dL) 36/64/4034 74.2  59.5-63.8 Final  . HCT (%) 04/18/2011 45.7  39.0-52.0 Final  . MCV (fL) 04/18/2011 90.1  78.0-100.0 Final  . MCH (pg) 04/18/2011 28.2  26.0-34.0 Final  . MCHC (g/dL) 75/64/3329 51.8  84.1-66.0 Final  . RDW (%) 04/18/2011 14.8  11.5-15.5 Final  . Platelets (K/uL) 04/18/2011 197  150-400 Final  . Sodium (mEq/L) 04/18/2011 142  135-145 Final  . Potassium (mEq/L) 04/18/2011 4.5  3.5-5.1 Final  . Chloride (mEq/L) 04/18/2011 105  96-112 Final  . CO2 (mEq/L) 04/18/2011 30  19-32 Final  . Glucose, Bld (mg/dL) 63/01/6008 932* 35-57 Final  .  BUN (mg/dL) 13/08/6576 16  4-69 Final  . Creatinine, Ser (mg/dL) 62/95/2841 3.24  4.01-0.27 Final  . Calcium (mg/dL) 25/36/6440 9.8  3.4-74.2 Final  . Total Protein (g/dL) 59/56/3875 7.3  6.4-3.3 Final  . Albumin (g/dL) 29/51/8841 4.1  6.6-0.6 Final  . AST (U/L) 04/18/2011 21  0-37 Final  . ALT (U/L) 04/18/2011 19  0-53 Final  . Alkaline Phosphatase (U/L) 04/18/2011 79  39-117 Final  . Total Bilirubin (mg/dL) 30/16/0109 0.6  3.2-3.5 Final  . GFR calc non Af Amer (mL/min) 04/18/2011 88* >90 Final  . GFR calc Af Amer (mL/min) 04/18/2011 >90  >90 Final   Comment:                                 The eGFR has been calculated                          using the CKD EPI equation.                          This calculation has not been                          validated in all clinical                          situations.                          eGFR's persistently                          <90 mL/min signify                          possible Chronic Kidney Disease.  Marland Kitchen Neutrophils Relative (%) 04/18/2011 47  43-77 Final  . Neutro Abs (K/uL) 04/18/2011 2.4  1.7-7.7 Final  . Lymphocytes Relative (%) 04/18/2011 41  12-46 Final  . Lymphs Abs (K/uL) 04/18/2011 2.1  0.7-4.0 Final  . Monocytes Relative (%) 04/18/2011 8  3-12 Final  . Monocytes  Absolute (K/uL) 04/18/2011 0.4  0.1-1.0 Final  . Eosinophils Relative (%) 04/18/2011 3  0-5 Final  . Eosinophils Absolute (K/uL) 04/18/2011 0.2  0.0-0.7 Final  . Basophils Relative (%) 04/18/2011 1  0-1 Final  . Basophils Absolute (K/uL) 04/18/2011 0.1  0.0-0.1 Final  . Prothrombin Time (seconds) 04/18/2011 13.4  11.6-15.2 Final  . INR  04/18/2011 1.00  0.00-1.49 Final  . Color, Urine  04/18/2011 YELLOW  YELLOW Final  . APPearance  04/18/2011 CLEAR  CLEAR Final  . Specific Gravity, Urine  04/18/2011 1.023  1.005-1.030 Final  . pH  04/18/2011 7.0  5.0-8.0 Final  . Glucose, UA (mg/dL) 57/32/2025 427* NEGATIVE Final  . Hgb urine dipstick  04/18/2011 NEGATIVE  NEGATIVE Final  . Bilirubin Urine  04/18/2011 NEGATIVE  NEGATIVE Final  . Ketones, ur (mg/dL) 07/02/7626 NEGATIVE  NEGATIVE Final  . Protein, ur (mg/dL) 31/51/7616 NEGATIVE  NEGATIVE Final  . Urobilinogen, UA (mg/dL) 07/37/1062 1.0  6.9-4.8 Final  . Nitrite  04/18/2011 NEGATIVE  NEGATIVE Final  . Leukocytes, UA  04/18/2011 NEGATIVE  NEGATIVE Final   MICROSCOPIC NOT DONE ON URINES WITH NEGATIVE PROTEIN, BLOOD, LEUKOCYTES, NITRITE, OR GLUCOSE <1000  mg/dL.    Basename 04/29/11 0420  HGB 9.2*    Basename 04/29/11 0420  WBC 10.6*  RBC 3.22*  HCT 27.9*  PLT 169   X-Rays:X-ray Knee Left Port  04/26/2011  *RADIOLOGY REPORT*  Clinical Data: Left knee arthroplasty.  PORTABLE LEFT KNEE - 1-2 VIEW  Comparison: 06/18/2010  Findings: Changes of left knee replacement.  No hardware or bony complicating feature.  Soft tissue drain in place.  Soft tissue and joint space gas noted.  IMPRESSION: Left knee replacement.  No complicating feature.  Original Report Authenticated By: [name], M.D.    EKG: Orders placed during the hospital encounter of 08/16/10  . EKG     Hospital Course: Patient was admitted to [facility] and taken to the OR and underwent the above state procedure without complications.  Patient tolerated the  procedure well and was later transferred to the recovery room and then to the orthopaedic floor for postoperative care.  They were given PO and IV analgesics for pain control following their surgery.  They were given 24 hours of postoperative antibiotics and started on DVT prophylaxis in the form of Xarelto.   PT and OT were ordered for total joint protocol.  Discharge planning consulted to help with postop disposition and equipment needs.  Patient had a good night on the evening of surgery and started to get up OOB with therapy on day one. Hemovac drain was pulled without difficulty. Dressing was changed on day one due to bloody drainage. Patient had small fracture blister around the wound. Continued to work with therapy into day two.  Dressing was changed again on day two and the incision was clean but with multiple fracture blisters present. They were cleaned with betadine, perforated, and dressed under sterile technique.  By day three, the patient had progressed with therapy and meeting their goals.  Incision was healing well and fracture blisters were clean. On post op day four, patient was seen in rounds and was ready to go home on pain medication, Xarelto, and Keflex since blisters were superficially open.  Discharge Medications: Prior to Admission medications   Medication Sig Start Date End Date Taking? Authorizing Provider  methylcellulose (ARTIFICIAL TEARS) 1 % ophthalmic solution Place 1 drop into both eyes 2 (two) times daily as needed. Dry eyes   Yes Historical Provider, MD  pioglitazone (ACTOS) 45 MG tablet Take 45 mg by mouth daily with breakfast.   Yes Historical Provider, MD  ALPRAZolam (XANAX) 1 MG tablet Take 1 mg by mouth 2 (two) times daily as needed. Anxiety     Historical Provider, MD  cephALEXin (KEFLEX) 500 MG capsule Take 1 capsule (500 mg total) by mouth 4 (four) times daily. 04/30/11 05/10/11  Alexzandrew Perkins, PA  methocarbamol (ROBAXIN) 500 MG tablet Take 1 tablet (500 mg  total) by mouth every 6 (six) hours as needed. 04/28/11 05/08/11  Gabriellia Rempel Tamala Ser, PA  oxyCODONE-acetaminophen (PERCOCET) 5-325 MG per tablet Take 1-2 tablets by mouth every 4 (four) hours as needed. 04/28/11 05/08/11  Jeremia Groot Tamala Ser, PA  rivaroxaban (XARELTO) 10 MG TABS tablet Take 1 tablet (10 mg total) by mouth daily with breakfast. 04/28/11   Lisvet Rasheed Tamala Ser, PA  sitaGLIPtin (JANUVIA) 100 MG tablet Take 100 mg by mouth daily with breakfast.    Historical Provider, MD    Diet: carb modified - medium Activity:WBAT Follow-up:in 2 weeks Disposition - Home Discharged Condition: good   Discharge Orders    Future Orders Please Complete By  Expires   Diet - low sodium heart healthy      Diet - low sodium heart healthy      Call MD / Call 911      Comments:   If you experience chest pain or shortness of breath, CALL 911 and be transported to the hospital emergency room.  If you develope a fever above 101 F, pus (white drainage) or increased drainage or redness at the wound, or calf pain, call your surgeon's office.   Constipation Prevention      Comments:   Drink plenty of fluids.  Prune juice may be helpful.  You may use a stool softener, such as Colace (over the counter) 100 mg twice a day.  Use MiraLax (over the counter) for constipation as needed.   Increase activity slowly as tolerated      Weight Bearing as taught in Physical Therapy      Comments:   Use a walker or crutches as instructed.   Discharge instructions      Comments:   Walk with your walker. Weight bearing as instructed. Change your dressing daily. Shower only, no tub bath. Call if any temperatures greater than 101 or any wound complications: [phone] during the day and ask for Dr. Jeannetta Stevenson nurse, [facility] [place].   Driving restrictions      Comments:   No driving   Change dressing      Comments:   Change dressing daily with sterile 4 x 4 inch gauze dressing   Do not put a pillow under the knee.  Place it under the heel.      Call MD / Call 911      Comments:   If you experience chest pain or shortness of breath, CALL 911 and be transported to the hospital emergency room.  If you develope a fever above 101 F, pus (white drainage) or increased drainage or redness at the wound, or calf pain, call your surgeon's office.   Constipation Prevention      Comments:   Drink plenty of fluids.  Prune juice may be helpful.  You may use a stool softener, such as Colace (over the counter) 100 mg twice a day.  Use MiraLax (over the counter) for constipation as needed.   Increase activity slowly as tolerated      Weight Bearing as taught in Physical Therapy      Comments:   Use a walker or crutches as instructed.   Discharge instructions      Comments:   Pick up stool softner and laxative for home. Do not submerge incision under water. May shower. Continue to use ice for pain and swelling from surgery.    Driving restrictions      Comments:   No driving   Lifting restrictions      Comments:   No lifting   TED hose      Comments:   Use stockings (TED hose) for 3 weeks on both leg(s).  You may remove them at night for sleeping.   Change dressing      Comments:   Will have HHRN come out and change dressing daily or every other day with Xeroform, sterile 4 x 4 inch gauze dressing and rewrap the leg and apply TED hose.   Do not put a pillow under the knee. Place it under the heel.        Medication List  As of 05/01/2011  9:13 AM   STOP taking these medications  fish oil-omega-3 fatty acids 1000 MG capsule      HYDROcodone-acetaminophen 10-325 MG per tablet      magnesium chloride 64 MG Tbec         TAKE these medications         ALPRAZolam 1 MG tablet   Commonly known as: XANAX   Take 1 mg by mouth 2 (two) times daily as needed. Anxiety        cephALEXin 500 MG capsule   Commonly known as: KEFLEX   Take 1 capsule (500 mg total) by mouth 4 (four) times daily.       methocarbamol 500 MG tablet   Commonly known as: ROBAXIN   Take 1 tablet (500 mg total) by mouth every 6 (six) hours as needed.      methylcellulose 1 % ophthalmic solution   Commonly known as: ARTIFICIAL TEARS   Place 1 drop into both eyes 2 (two) times daily as needed. Dry eyes      oxyCODONE-acetaminophen 5-325 MG per tablet   Commonly known as: PERCOCET   Take 1-2 tablets by mouth every 4 (four) hours as needed.      pioglitazone 45 MG tablet   Commonly known as: ACTOS   Take 45 mg by mouth daily with breakfast.      rivaroxaban 10 MG Tabs tablet   Commonly known as: XARELTO   Take 1 tablet (10 mg total) by mouth daily with breakfast.      sitaGLIPtin 100 MG tablet   Commonly known as: JANUVIA   Take 100 mg by mouth daily with breakfast.           Follow-up Information    Follow up with GIOFFRE,RONALD A, MD. Schedule an appointment as soon as possible for a visit in 2 weeks.   Contact information:   [facility] [address] [place] [address] [phone]          Signed: Dimitri Ped [facility] 05/01/2011, 9:13 AM

## 2011-05-03 NOTE — Op Note (Signed)
NAMEAXLE, PARFAIT NO.:  [id number]  MEDICAL RECORD NO.:  [id number]  LOCATION:  1602                         FACILITY:  [facility]  PHYSICIAN:  Georges Lynch. Tyla Burgner, M.D.DATE OF BIRTH:  19-May-1945  DATE OF PROCEDURE: DATE OF DISCHARGE:  04/30/2011                              OPERATIVE REPORT   ADDENDUM:  ASSISTANT:  Dimitri Ped, PA          ______________________________ Georges Lynch. Darrelyn Hillock, M.D.     RAG/MEDQ  D:  05/02/2011  T:  05/03/2011  Job:  [id number]

## 2011-05-08 ENCOUNTER — Encounter (HOSPITAL_COMMUNITY): Payer: Self-pay | Admitting: Orthopedic Surgery

## 2011-05-24 ENCOUNTER — Ambulatory Visit: Payer: Medicare Other | Attending: Orthopedic Surgery | Admitting: Physical Therapy

## 2011-05-24 DIAGNOSIS — M25669 Stiffness of unspecified knee, not elsewhere classified: Secondary | ICD-10-CM | POA: Insufficient documentation

## 2011-05-24 DIAGNOSIS — R5381 Other malaise: Secondary | ICD-10-CM | POA: Insufficient documentation

## 2011-05-24 DIAGNOSIS — IMO0001 Reserved for inherently not codable concepts without codable children: Secondary | ICD-10-CM | POA: Insufficient documentation

## 2011-05-24 DIAGNOSIS — M25569 Pain in unspecified knee: Secondary | ICD-10-CM | POA: Insufficient documentation

## 2011-05-24 DIAGNOSIS — R269 Unspecified abnormalities of gait and mobility: Secondary | ICD-10-CM | POA: Insufficient documentation

## 2011-05-25 ENCOUNTER — Ambulatory Visit: Payer: Medicare Other | Admitting: Physical Therapy

## 2011-05-29 ENCOUNTER — Ambulatory Visit: Payer: Medicare Other | Admitting: Physical Therapy

## 2011-05-31 ENCOUNTER — Ambulatory Visit: Payer: Medicare Other | Admitting: Physical Therapy

## 2011-06-02 ENCOUNTER — Ambulatory Visit: Payer: Medicare Other | Admitting: Physical Therapy

## 2011-06-07 ENCOUNTER — Ambulatory Visit: Payer: Medicare Other | Admitting: Physical Therapy

## 2011-06-09 ENCOUNTER — Ambulatory Visit: Payer: Medicare Other | Admitting: Physical Therapy

## 2011-06-12 ENCOUNTER — Ambulatory Visit: Payer: Medicare Other | Attending: Orthopedic Surgery | Admitting: Physical Therapy

## 2011-06-12 DIAGNOSIS — M25669 Stiffness of unspecified knee, not elsewhere classified: Secondary | ICD-10-CM | POA: Insufficient documentation

## 2011-06-12 DIAGNOSIS — M25569 Pain in unspecified knee: Secondary | ICD-10-CM | POA: Insufficient documentation

## 2011-06-12 DIAGNOSIS — R5381 Other malaise: Secondary | ICD-10-CM | POA: Insufficient documentation

## 2011-06-12 DIAGNOSIS — R269 Unspecified abnormalities of gait and mobility: Secondary | ICD-10-CM | POA: Insufficient documentation

## 2011-06-12 DIAGNOSIS — IMO0001 Reserved for inherently not codable concepts without codable children: Secondary | ICD-10-CM | POA: Insufficient documentation

## 2011-06-14 ENCOUNTER — Ambulatory Visit: Payer: Medicare Other | Admitting: Physical Therapy

## 2011-06-16 ENCOUNTER — Ambulatory Visit: Payer: Medicare Other | Admitting: *Deleted

## 2011-06-19 ENCOUNTER — Ambulatory Visit: Payer: Medicare Other | Admitting: Physical Therapy

## 2011-06-21 ENCOUNTER — Ambulatory Visit: Payer: Medicare Other | Admitting: Physical Therapy

## 2011-06-23 ENCOUNTER — Ambulatory Visit: Payer: Medicare Other | Admitting: Physical Therapy

## 2011-06-26 ENCOUNTER — Ambulatory Visit: Payer: Medicare Other | Admitting: Physical Therapy

## 2011-06-28 ENCOUNTER — Ambulatory Visit: Payer: Medicare Other | Admitting: Physical Therapy

## 2011-06-30 ENCOUNTER — Ambulatory Visit: Payer: Medicare Other | Admitting: Physical Therapy

## 2011-07-03 ENCOUNTER — Ambulatory Visit: Payer: Medicare Other | Admitting: Physical Therapy

## 2011-07-05 ENCOUNTER — Ambulatory Visit: Payer: Medicare Other | Admitting: Physical Therapy

## 2011-07-07 ENCOUNTER — Ambulatory Visit: Payer: Medicare Other | Admitting: Physical Therapy

## 2011-07-10 ENCOUNTER — Ambulatory Visit: Payer: Medicare Other | Attending: Orthopedic Surgery | Admitting: Physical Therapy

## 2011-07-10 DIAGNOSIS — M25569 Pain in unspecified knee: Secondary | ICD-10-CM | POA: Insufficient documentation

## 2011-07-10 DIAGNOSIS — R269 Unspecified abnormalities of gait and mobility: Secondary | ICD-10-CM | POA: Insufficient documentation

## 2011-07-10 DIAGNOSIS — IMO0001 Reserved for inherently not codable concepts without codable children: Secondary | ICD-10-CM | POA: Insufficient documentation

## 2011-07-10 DIAGNOSIS — R5381 Other malaise: Secondary | ICD-10-CM | POA: Insufficient documentation

## 2011-07-10 DIAGNOSIS — M25669 Stiffness of unspecified knee, not elsewhere classified: Secondary | ICD-10-CM | POA: Insufficient documentation

## 2011-07-12 ENCOUNTER — Ambulatory Visit: Payer: Medicare Other | Admitting: Physical Therapy

## 2011-07-14 ENCOUNTER — Ambulatory Visit: Payer: Medicare Other | Admitting: Physical Therapy

## 2011-07-18 ENCOUNTER — Ambulatory Visit: Payer: Medicare Other | Admitting: Physical Therapy

## 2011-07-19 ENCOUNTER — Ambulatory Visit: Payer: Medicare Other | Admitting: Physical Therapy

## 2011-07-21 ENCOUNTER — Ambulatory Visit: Payer: Medicare Other | Admitting: Physical Therapy

## 2011-07-24 ENCOUNTER — Ambulatory Visit: Payer: Medicare Other | Admitting: Physical Therapy

## 2011-07-26 ENCOUNTER — Ambulatory Visit: Payer: Medicare Other | Admitting: Physical Therapy

## 2011-07-28 ENCOUNTER — Ambulatory Visit: Payer: Medicare Other | Admitting: Physical Therapy

## 2011-07-31 ENCOUNTER — Ambulatory Visit: Payer: Medicare Other | Admitting: Physical Therapy

## 2011-08-02 ENCOUNTER — Ambulatory Visit: Payer: Medicare Other | Admitting: Physical Therapy

## 2011-08-04 ENCOUNTER — Ambulatory Visit: Payer: Medicare Other | Admitting: *Deleted

## 2011-08-07 ENCOUNTER — Ambulatory Visit: Payer: Medicare Other | Admitting: Physical Therapy

## 2011-08-09 ENCOUNTER — Ambulatory Visit: Payer: Medicare Other | Admitting: Physical Therapy

## 2011-08-11 ENCOUNTER — Ambulatory Visit: Payer: Medicare Other | Attending: Orthopedic Surgery | Admitting: *Deleted

## 2011-08-11 DIAGNOSIS — M25669 Stiffness of unspecified knee, not elsewhere classified: Secondary | ICD-10-CM | POA: Insufficient documentation

## 2011-08-11 DIAGNOSIS — R269 Unspecified abnormalities of gait and mobility: Secondary | ICD-10-CM | POA: Insufficient documentation

## 2011-08-11 DIAGNOSIS — M25569 Pain in unspecified knee: Secondary | ICD-10-CM | POA: Insufficient documentation

## 2011-08-11 DIAGNOSIS — Z96659 Presence of unspecified artificial knee joint: Secondary | ICD-10-CM | POA: Insufficient documentation

## 2011-08-11 DIAGNOSIS — IMO0001 Reserved for inherently not codable concepts without codable children: Secondary | ICD-10-CM | POA: Insufficient documentation

## 2011-08-11 DIAGNOSIS — R5381 Other malaise: Secondary | ICD-10-CM | POA: Insufficient documentation

## 2011-11-17 ENCOUNTER — Other Ambulatory Visit: Payer: Self-pay | Admitting: Orthopedic Surgery

## 2011-11-17 ENCOUNTER — Encounter (HOSPITAL_COMMUNITY): Payer: Self-pay | Admitting: *Deleted

## 2011-11-17 ENCOUNTER — Inpatient Hospital Stay (HOSPITAL_COMMUNITY)
Admission: AD | Admit: 2011-11-17 | Discharge: 2011-11-22 | DRG: 581 | Disposition: A | Discharge status: Home / Self Care | Payer: Medicare Other | Source: Ambulatory Visit | Attending: Orthopedic Surgery | Admitting: Orthopedic Surgery

## 2011-11-17 ENCOUNTER — Inpatient Hospital Stay (HOSPITAL_COMMUNITY): Payer: Medicare Other

## 2011-11-17 DIAGNOSIS — L02416 Cutaneous abscess of left lower limb: Secondary | ICD-10-CM

## 2011-11-17 DIAGNOSIS — S79919A Unspecified injury of unspecified hip, initial encounter: Secondary | ICD-10-CM | POA: Diagnosis present

## 2011-11-17 DIAGNOSIS — L02419 Cutaneous abscess of limb, unspecified: Principal | ICD-10-CM | POA: Diagnosis present

## 2011-11-17 DIAGNOSIS — Z96659 Presence of unspecified artificial knee joint: Secondary | ICD-10-CM

## 2011-11-17 DIAGNOSIS — L03119 Cellulitis of unspecified part of limb: Principal | ICD-10-CM | POA: Diagnosis present

## 2011-11-17 DIAGNOSIS — W2203XA Walked into furniture, initial encounter: Secondary | ICD-10-CM | POA: Diagnosis present

## 2011-11-17 DIAGNOSIS — A4902 Methicillin resistant Staphylococcus aureus infection, unspecified site: Secondary | ICD-10-CM | POA: Diagnosis present

## 2011-11-17 LAB — PROTIME-INR
INR: 1.11 (ref 0.00–1.49)
Prothrombin Time: 14.2 seconds (ref 11.6–15.2)

## 2011-11-17 LAB — COMPREHENSIVE METABOLIC PANEL
BUN: 13 mg/dL (ref 6–23)
Calcium: 9.5 mg/dL (ref 8.4–10.5)
Creatinine, Ser: 0.99 mg/dL (ref 0.50–1.35)
GFR calc Af Amer: >90 mL/min (ref >90)
Glucose, Bld: 168 mg/dL — ABNORMAL HIGH (ref 70–99)
Sodium: 133 mEq/L — ABNORMAL LOW (ref 135–145)
Total Protein: 7.3 g/dL (ref 6.0–8.3)

## 2011-11-17 LAB — URINALYSIS, ROUTINE W REFLEX MICROSCOPIC
Ketones, ur: NEGATIVE mg/dL
Leukocytes, UA: NEGATIVE
Protein, ur: NEGATIVE mg/dL
Urobilinogen, UA: 4 mg/dL — ABNORMAL HIGH (ref 0.0–1.0)

## 2011-11-17 LAB — SURGICAL PCR SCREEN: MRSA, PCR: NEGATIVE

## 2011-11-17 MED ORDER — HYDROMORPHONE HCL PF 1 MG/ML IJ SOLN
1.0000 mg | INTRAMUSCULAR | Status: DC | PRN
  Administered 2011-11-17 – 2011-11-18 (×2): 1 mg via INTRAVENOUS
  Filled 2011-11-17 (×2): qty 1

## 2011-11-17 MED ORDER — POVIDONE-IODINE 7.5 % EX SOLN: Freq: Once | CUTANEOUS | Status: DC

## 2011-11-17 MED ORDER — CEFAZOLIN SODIUM-DEXTROSE 2-3 GM-% IV SOLR
2.0000 g | INTRAVENOUS | Status: AC
  Administered 2011-11-18: 2 g via INTRAVENOUS
  Filled 2011-11-17: qty 50

## 2011-11-17 MED ORDER — PIOGLITAZONE HCL 45 MG PO TABS
45.0000 mg | ORAL_TABLET | Freq: Every day | ORAL | Status: DC
  Administered 2011-11-19 – 2011-11-22 (×4): 45 mg via ORAL
  Filled 2011-11-17 (×6): qty 1

## 2011-11-17 MED ORDER — LACTATED RINGERS IV SOLN
INTRAVENOUS | Status: DC
  Administered 2011-11-17 – 2011-11-18 (×2): via INTRAVENOUS

## 2011-11-17 MED ORDER — POLYVINYL ALCOHOL 1.4 % OP SOLN
1.0000 [drp] | Freq: Two times a day (BID) | OPHTHALMIC | Status: DC | PRN
  Filled 2011-11-17: qty 15

## 2011-11-17 MED ORDER — VANCOMYCIN HCL IN DEXTROSE 1-5 GM/200ML-% IV SOLN
1000.0000 mg | Freq: Once | INTRAVENOUS | Status: DC
  Filled 2011-11-17: qty 200

## 2011-11-17 MED ORDER — METHYLCELLULOSE 1 % OP SOLN: 1.0000 [drp] | Freq: Two times a day (BID) | OPHTHALMIC | Status: DC | PRN

## 2011-11-17 MED ORDER — ALPRAZOLAM 1 MG PO TABS: 1.0000 mg | ORAL_TABLET | Freq: Two times a day (BID) | ORAL | Status: DC | PRN

## 2011-11-17 MED ORDER — VANCOMYCIN HCL IN DEXTROSE 1-5 GM/200ML-% IV SOLN
1000.0000 mg | Freq: Two times a day (BID) | INTRAVENOUS | Status: DC
  Administered 2011-11-18: 1000 mg via INTRAVENOUS
  Filled 2011-11-17 (×2): qty 200

## 2011-11-17 MED ORDER — LINAGLIPTIN 5 MG PO TABS
5.0000 mg | ORAL_TABLET | Freq: Every day | ORAL | Status: DC
  Administered 2011-11-19 – 2011-11-22 (×4): 5 mg via ORAL
  Filled 2011-11-17 (×6): qty 1

## 2011-11-17 MED ORDER — VANCOMYCIN HCL 1000 MG IV SOLR
2000.0000 mg | Freq: Once | INTRAVENOUS | Status: AC
  Administered 2011-11-17: 2000 mg via INTRAVENOUS
  Filled 2011-11-17 (×2): qty 2000

## 2011-11-17 NOTE — Progress Notes (Addendum)
ANTIBIOTIC CONSULT NOTE - INITIAL  Pharmacy Consult for Vancomycin  Indication: Thigh Abscess  Allergies  Allergen Reactions  . Iohexol      Code: RASH, Onset Date: 03252001     Patient Measurements: Height: 5' 10.87" (180 cm) Weight: 238 lb 1.6 oz (108 kg) IBW/kg (Calculated) : 74.99    Vital Signs: Temp: 99.4 F (37.4 C) (11/08 1703) Temp src: Oral (11/08 1703) BP: 149/76 mmHg (11/08 1703) Pulse Rate: 75  (11/08 1703) Intake/Output from previous day:   Intake/Output from this shift: Total I/O In: 360 [P.O.:360] Out: -   Labs: No results found for this basename: WBC:3,HGB:3,PLT:3,LABCREA:3,CREATININE:3 in the last 72 hours Estimated Creatinine Clearance: 107.7 ml/min (by C-G formula based on Cr of 0.83). No results found for this basename: VANCOTROUGH:2,VANCOPEAK:2,VANCORANDOM:2,GENTTROUGH:2,GENTPEAK:2,GENTRANDOM:2,TOBRATROUGH:2,TOBRAPEAK:2,TOBRARND:2,AMIKACINPEAK:2,AMIKACINTROU:2,AMIKACIN:2, in the last 72 hours   Microbiology: No results found for this or any previous visit (from the past 720 hour(s)).  Medical History: Past Medical History  Diagnosis Date  . Arthritis   . Diabetes mellitus   . Cancer     L EYE - MELANOMA    Medications:  Scheduled:    .  ceFAZolin (ANCEF) IV  2 g Intravenous 60 min Pre-Op  . linagliptin  5 mg Oral Daily  . pioglitazone  45 mg Oral Q breakfast  . povidone-iodine   Topical Once  . vancomycin  2,000 mg Intravenous Once  . [DISCONTINUED] vancomycin  1,000 mg Intravenous Once   Infusions:    . lactated ringers     PRN: ALPRAZolam, HYDROmorphone (DILAUDID) injection, polyvinyl alcohol, [DISCONTINUED] methylcellulose Assessment:  66 yo M with thigh abscess starting vancomycin per pharmacy.  Patient is s/p aspiration of abscess as outpatient on 11/8, cultures sent.  Patient admitted today for I&D planned 11/9  BMET is pending  Patient's weight is 108 kg  Goal of Therapy:  Vancomycin trough level 15-20  mcg/ml  Plan:  1.) Vancomycin 2 grams IV x once now 2.) Will f/u Continued vancomycin dose when BMET results 3.) F/U Cultures from 11/8   Christian Sue Mcalexander Patricia PharmD Pager #: [phone] 7:05 PM 11/17/2011  ADDENDUM:  Continue with Vancomycin 1 gram IV q12h, after the 2 gram load is given.    Latima Hamza Patricia PharmD Pager #: [phone] 7:57 PM 11/17/2011      

## 2011-11-17 NOTE — H&P (Signed)
[name] is an 66 y.o. male.   Chief Complaint: Pain and swelling of left thigh HPI: A couch struck hi left thigh several weeks ago and he developed a traumatic abscess and initially developed an abraison.  Past Medical History  Diagnosis Date  . Arthritis   . Diabetes mellitus   . Cancer     L EYE - MELANOMA    Past Surgical History  Procedure Date  . Knee arthroscopy 08/2010  . 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 Eye surgery     RETINA SURG RT EYE  . 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 Total knee arthroplasty 04/26/2011    Procedure: TOTAL KNEE ARTHROPLASTY;  Surgeon: Jacki Cones, MD;  Location: [facility] ORS;  Service: Orthopedics;  Laterality: Left;    No family history on file. Social History:  reports that he has never smoked. His smokeless tobacco use includes Chew. He reports that he drinks alcohol. He reports that he does not use illicit drugs.  Allergies:  Allergies  Allergen Reactions  . Iohexol      Code: RASH, Onset Date: 45409811      (Not in a hospital admission)  No results found for this or any previous visit (from the past 48 hour(s)). No results found.  Review of Systems  Constitutional: Negative.   HENT: Negative.   Eyes: Negative.   Respiratory: Negative.   Cardiovascular: Negative.   Gastrointestinal: Negative.   Genitourinary: Negative.   Musculoskeletal: Positive for joint pain.  Skin: Positive for rash.  Neurological: Negative.   Endo/Heme/Allergies: Negative.   Psychiatric/Behavioral: Negative.     There were no vitals taken for this visit. Physical Exam  Constitutional: He appears well-developed.  HENT:  Head: Normocephalic.  Eyes: Pupils are equal, round, and reactive to light.  Neck: Normal range of motion.  Cardiovascular: Normal rate.   Respiratory: Effort normal.  GI: Soft.  Musculoskeletal: He exhibits tenderness.       Left knee: He exhibits swelling and erythema. tenderness found.   Legs: Neurological: He is alert.  Skin: Skin is warm. There is erythema.     Assessment/Plan Aspiration of abscess in office revealed purulent material. C&S sent for Aerobic and Anarobic,and patient admitted for Surgery tomorrow.  Lido Maske A 11/17/2011, 3:27 PM

## 2011-11-18 ENCOUNTER — Encounter (HOSPITAL_COMMUNITY)
Admission: AD | Disposition: A | Discharge status: Home / Self Care | Payer: Self-pay | Source: Ambulatory Visit | Attending: Orthopedic Surgery

## 2011-11-18 ENCOUNTER — Inpatient Hospital Stay (HOSPITAL_COMMUNITY): Payer: Medicare Other | Admitting: Anesthesiology

## 2011-11-18 ENCOUNTER — Ambulatory Visit (HOSPITAL_COMMUNITY): Admission: AD | Admit: 2011-11-18 | Payer: Medicare Other | Source: Ambulatory Visit | Admitting: Orthopedic Surgery

## 2011-11-18 ENCOUNTER — Encounter (HOSPITAL_COMMUNITY): Payer: Self-pay | Admitting: Anesthesiology

## 2011-11-18 ENCOUNTER — Encounter (HOSPITAL_COMMUNITY): Payer: Self-pay | Admitting: *Deleted

## 2011-11-18 DIAGNOSIS — L02416 Cutaneous abscess of left lower limb: Secondary | ICD-10-CM | POA: Diagnosis present

## 2011-11-18 HISTORY — PX: I&D EXTREMITY: SHX5045

## 2011-11-18 LAB — GLUCOSE, CAPILLARY
Glucose-Capillary: 118 mg/dL — ABNORMAL HIGH (ref 70–99)
Glucose-Capillary: 101 mg/dL — ABNORMAL HIGH (ref 70–99)
Glucose-Capillary: 199 mg/dL — ABNORMAL HIGH (ref 70–99)
Glucose-Capillary: 80 mg/dL (ref 70–99)

## 2011-11-18 SURGERY — IRRIGATION AND DEBRIDEMENT EXTREMITY
Anesthesia: General | Site: Knee | Laterality: Left | Wound class: Dirty or Infected

## 2011-11-18 MED ORDER — ONDANSETRON HCL 4 MG PO TABS: 4.0000 mg | ORAL_TABLET | Freq: Four times a day (QID) | ORAL | Status: DC | PRN

## 2011-11-18 MED ORDER — ALUM & MAG HYDROXIDE-SIMETH 200-200-20 MG/5ML PO SUSP: 30.0000 mL | ORAL | Status: DC | PRN

## 2011-11-18 MED ORDER — FENTANYL CITRATE 0.05 MG/ML IJ SOLN
INTRAMUSCULAR | Status: DC | PRN
  Administered 2011-11-18: 25 ug via INTRAVENOUS
  Administered 2011-11-18: 50 ug via INTRAVENOUS
  Administered 2011-11-18: 25 ug via INTRAVENOUS
  Administered 2011-11-18: 100 ug via INTRAVENOUS
  Administered 2011-11-18: 50 ug via INTRAVENOUS

## 2011-11-18 MED ORDER — HYDROMORPHONE HCL PF 1 MG/ML IJ SOLN
0.2500 mg | INTRAMUSCULAR | Status: DC | PRN
  Administered 2011-11-18 (×2): 0.5 mg via INTRAVENOUS

## 2011-11-18 MED ORDER — ACETAMINOPHEN 325 MG PO TABS: 650.0000 mg | ORAL_TABLET | Freq: Four times a day (QID) | ORAL | Status: DC | PRN

## 2011-11-18 MED ORDER — VANCOMYCIN HCL 1000 MG IV SOLR
2000.0000 mg | Freq: Once | INTRAVENOUS | Status: AC
  Administered 2011-11-18: 2000 mg via INTRAVENOUS
  Filled 2011-11-18: qty 2000

## 2011-11-18 MED ORDER — ONDANSETRON HCL 4 MG/2ML IJ SOLN
INTRAMUSCULAR | Status: DC | PRN
  Administered 2011-11-18: 4 mg via INTRAVENOUS

## 2011-11-18 MED ORDER — SODIUM CHLORIDE 0.9 % IR SOLN
Status: DC | PRN
  Administered 2011-11-18: 13:00:00

## 2011-11-18 MED ORDER — OXYCODONE-ACETAMINOPHEN 5-325 MG PO TABS: 2.0000 | ORAL_TABLET | ORAL | Status: DC | PRN

## 2011-11-18 MED ORDER — ACETAMINOPHEN 650 MG RE SUPP: 650.0000 mg | Freq: Four times a day (QID) | RECTAL | Status: DC | PRN

## 2011-11-18 MED ORDER — VANCOMYCIN HCL IN DEXTROSE 1-5 GM/200ML-% IV SOLN
1000.0000 mg | Freq: Two times a day (BID) | INTRAVENOUS | Status: AC
  Administered 2011-11-19: 1000 mg via INTRAVENOUS
  Filled 2011-11-18: qty 200

## 2011-11-18 MED ORDER — PHENOL 1.4 % MT LIQD: 1.0000 | OROMUCOSAL | Status: DC | PRN

## 2011-11-18 MED ORDER — MENTHOL 3 MG MT LOZG: 1.0000 | LOZENGE | OROMUCOSAL | Status: DC | PRN

## 2011-11-18 MED ORDER — PROPOFOL 10 MG/ML IV BOLUS
INTRAVENOUS | Status: DC | PRN
  Administered 2011-11-18: 150 mg via INTRAVENOUS

## 2011-11-18 MED ORDER — HYDROCODONE-ACETAMINOPHEN 5-325 MG PO TABS
1.0000 | ORAL_TABLET | ORAL | Status: DC | PRN
  Administered 2011-11-18: 1 via ORAL
  Administered 2011-11-19 – 2011-11-22 (×4): 2 via ORAL
  Filled 2011-11-18 (×2): qty 2
  Filled 2011-11-18: qty 1
  Filled 2011-11-18 (×2): qty 2

## 2011-11-18 MED ORDER — PROMETHAZINE HCL 25 MG/ML IJ SOLN: 6.2500 mg | INTRAMUSCULAR | Status: DC | PRN

## 2011-11-18 MED ORDER — FLEET ENEMA 7-19 GM/118ML RE ENEM: 1.0000 | ENEMA | Freq: Once | RECTAL | Status: AC | PRN

## 2011-11-18 MED ORDER — MIDAZOLAM HCL 5 MG/5ML IJ SOLN
INTRAMUSCULAR | Status: DC | PRN
  Administered 2011-11-18: 2 mg via INTRAVENOUS

## 2011-11-18 MED ORDER — INSULIN ASPART 100 UNIT/ML ~~LOC~~ SOLN
0.0000 [IU] | Freq: Three times a day (TID) | SUBCUTANEOUS | Status: DC
  Administered 2011-11-18: 3 [IU] via SUBCUTANEOUS
  Administered 2011-11-19 – 2011-11-21 (×3): 2 [IU] via SUBCUTANEOUS

## 2011-11-18 MED ORDER — METHOCARBAMOL 500 MG PO TABS
500.0000 mg | ORAL_TABLET | Freq: Four times a day (QID) | ORAL | Status: DC | PRN
  Administered 2011-11-18 – 2011-11-19 (×2): 500 mg via ORAL
  Filled 2011-11-18 (×2): qty 1

## 2011-11-18 MED ORDER — RIVAROXABAN 10 MG PO TABS
10.0000 mg | ORAL_TABLET | Freq: Every day | ORAL | Status: DC
  Administered 2011-11-19 – 2011-11-22 (×4): 10 mg via ORAL
  Filled 2011-11-18 (×5): qty 1

## 2011-11-18 MED ORDER — POLYETHYLENE GLYCOL 3350 17 G PO PACK: 17.0000 g | PACK | Freq: Every day | ORAL | Status: DC | PRN

## 2011-11-18 MED ORDER — BISACODYL 10 MG RE SUPP: 10.0000 mg | Freq: Every day | RECTAL | Status: DC | PRN

## 2011-11-18 MED ORDER — METHOCARBAMOL 100 MG/ML IJ SOLN
500.0000 mg | Freq: Four times a day (QID) | INTRAVENOUS | Status: DC | PRN
  Filled 2011-11-18: qty 5

## 2011-11-18 MED ORDER — ONDANSETRON HCL 4 MG/2ML IJ SOLN: 4.0000 mg | Freq: Four times a day (QID) | INTRAMUSCULAR | Status: DC | PRN

## 2011-11-18 MED ORDER — LACTATED RINGERS IV SOLN
INTRAVENOUS | Status: DC
  Administered 2011-11-18: 21:00:00 via INTRAVENOUS

## 2011-11-18 MED ORDER — HYDROMORPHONE HCL PF 1 MG/ML IJ SOLN
1.0000 mg | INTRAMUSCULAR | Status: DC | PRN
  Administered 2011-11-19: 1 mg via INTRAVENOUS
  Filled 2011-11-18: qty 1

## 2011-11-18 MED ORDER — SODIUM CHLORIDE 0.9 % IR SOLN
Status: DC | PRN
  Administered 2011-11-18: 3000 mL

## 2011-11-18 SURGICAL SUPPLY — 44 items
BAG ZIPLOCK 12X15 (MISCELLANEOUS) ×2 IMPLANT
BANDAGE ELASTIC 4 VELCRO ST LF (GAUZE/BANDAGES/DRESSINGS) ×2 IMPLANT
BANDAGE ELASTIC 6 VELCRO ST LF (GAUZE/BANDAGES/DRESSINGS) ×2 IMPLANT
BANDAGE GAUZE ELAST BULKY 4 IN (GAUZE/BANDAGES/DRESSINGS) ×8 IMPLANT
CLOTH BEACON ORANGE TIMEOUT ST (SAFETY) ×2 IMPLANT
CUFF TOURN SGL QUICK 18 (TOURNIQUET CUFF) IMPLANT
CUFF TOURN SGL QUICK 24 (TOURNIQUET CUFF)
CUFF TOURN SGL QUICK 34 (TOURNIQUET CUFF)
CUFF TRNQT CYL 24X4X40X1 (TOURNIQUET CUFF) IMPLANT
CUFF TRNQT CYL 34X4X40X1 (TOURNIQUET CUFF) IMPLANT
DRAPE STERI 35X30 U-POUCH (DRAPES) ×4 IMPLANT
DRAPE U-SHAPE 47X51 STRL (DRAPES) ×2 IMPLANT
DRSG ADAPTIC 3X8 NADH LF (GAUZE/BANDAGES/DRESSINGS) IMPLANT
DRSG PAD ABDOMINAL 8X10 ST (GAUZE/BANDAGES/DRESSINGS) ×2 IMPLANT
DURAPREP 26ML APPLICATOR (WOUND CARE) ×2 IMPLANT
ELECT REM PT RETURN 9FT ADLT (ELECTROSURGICAL) ×2
ELECTRODE REM PT RTRN 9FT ADLT (ELECTROSURGICAL) ×1 IMPLANT
GLOVE BIOGEL PI IND STRL 8 (GLOVE) ×1 IMPLANT
GLOVE BIOGEL PI IND STRL 8.5 (GLOVE) ×1 IMPLANT
GLOVE BIOGEL PI INDICATOR 8 (GLOVE) ×1
GLOVE BIOGEL PI INDICATOR 8.5 (GLOVE) ×1
GLOVE ECLIPSE 8.0 STRL XLNG CF (GLOVE) ×4 IMPLANT
GOWN PREVENTION PLUS LG XLONG (DISPOSABLE) ×4 IMPLANT
GOWN STRL REIN XL XLG (GOWN DISPOSABLE) ×4 IMPLANT
HANDPIECE INTERPULSE COAX TIP (DISPOSABLE) ×1
KIT BASIN OR (CUSTOM PROCEDURE TRAY) ×2 IMPLANT
MANIFOLD NEPTUNE II (INSTRUMENTS) ×2 IMPLANT
PACK LOWER EXTREMITY WL (CUSTOM PROCEDURE TRAY) ×2 IMPLANT
POSITIONER SURGICAL ARM (MISCELLANEOUS) ×2 IMPLANT
SET HNDPC FAN SPRY TIP SCT (DISPOSABLE) ×1 IMPLANT
SPONGE GAUZE 4X4 12PLY (GAUZE/BANDAGES/DRESSINGS) ×4 IMPLANT
SPONGE LAP 4X18 X RAY DECT (DISPOSABLE) IMPLANT
STOCKINETTE 4X48 STRL (DRAPES) ×2 IMPLANT
SUT ETHILON 1 LR 30 (SUTURE) ×4 IMPLANT
SUT ETHILON 2 0 PS N (SUTURE) ×4 IMPLANT
SUT VIC AB 0 CT1 36 (SUTURE) ×4 IMPLANT
SUT VIC AB 2-0 CT1 27 (SUTURE) ×2
SUT VIC AB 2-0 CT1 TAPERPNT 27 (SUTURE) ×2 IMPLANT
SWAB COLLECTION DEVICE MRSA (MISCELLANEOUS) ×2 IMPLANT
SYR BULB IRRIGATION 50ML (SYRINGE) ×2 IMPLANT
SYR CONTROL 10ML LL (SYRINGE) ×2 IMPLANT
TAPE CLOTH SURG 4X10 WHT LF (GAUZE/BANDAGES/DRESSINGS) ×2 IMPLANT
TOWEL OR 17X26 10 PK STRL BLUE (TOWEL DISPOSABLE) ×4 IMPLANT
TUBE ANAEROBIC SPECIMEN COL (MISCELLANEOUS) ×2 IMPLANT

## 2011-11-18 NOTE — H&P (View-Only) (Signed)
ANTIBIOTIC CONSULT NOTE - INITIAL  Pharmacy Consult for Vancomycin  Indication: Thigh Abscess  Allergies  Allergen Reactions  . Iohexol      Code: RASH, Onset Date: 16109604     Patient Measurements: Height: 5' 10.87" (180 cm) Weight: 238 lb 1.6 oz (108 kg) IBW/kg (Calculated) : 74.99    Vital Signs: Temp: 99.4 F (37.4 C) (11/08 1703) Temp src: Oral (11/08 1703) BP: 149/76 mmHg (11/08 1703) Pulse Rate: 75  (11/08 1703) Intake/Output from previous day:   Intake/Output from this shift: Total I/O In: 360 [P.O.:360] Out: -   Labs: No results found for this basename: WBC:3,HGB:3,PLT:3,LABCREA:3,CREATININE:3 in the last 72 hours Estimated Creatinine Clearance: 107.7 ml/min (by C-G formula based on Cr of 0.83). No results found for this basename: VANCOTROUGH:2,VANCOPEAK:2,VANCORANDOM:2,GENTTROUGH:2,GENTPEAK:2,GENTRANDOM:2,TOBRATROUGH:2,TOBRAPEAK:2,TOBRARND:2,AMIKACINPEAK:2,AMIKACINTROU:2,AMIKACIN:2, in the last 72 hours   Microbiology: No results found for this or any previous visit (from the past 720 hour(s)).  Medical History: Past Medical History  Diagnosis Date  . Arthritis   . Diabetes mellitus   . Cancer     L EYE - MELANOMA    Medications:  Scheduled:    .  ceFAZolin (ANCEF) IV  2 g Intravenous 60 min Pre-Op  . linagliptin  5 mg Oral Daily  . pioglitazone  45 mg Oral Q breakfast  . povidone-iodine   Topical Once  . vancomycin  2,000 mg Intravenous Once  . [DISCONTINUED] vancomycin  1,000 mg Intravenous Once   Infusions:    . lactated ringers     PRN: ALPRAZolam, HYDROmorphone (DILAUDID) injection, polyvinyl alcohol, [DISCONTINUED] methylcellulose Assessment:  66 yo M with thigh abscess starting vancomycin per pharmacy.  Patient is s/p aspiration of abscess as outpatient on 11/8, cultures sent.  Patient admitted today for I&D planned 11/9  BMET is pending  Patient's weight is 108 kg  Goal of Therapy:  Vancomycin trough level 15-20  mcg/ml  Plan:  1.) Vancomycin 2 grams IV x once now 2.) Will f/u Continued vancomycin dose when BMET results 3.) F/U Cultures from 11/8   BorgerdingLoma Messing PharmD Pager #: [phone] 7:05 PM 11/17/2011  ADDENDUM:  Continue with Vancomycin 1 gram IV q12h, after the 2 gram load is given.    BorgerdingLoma Messing PharmD Pager #: [phone] 7:57 PM 11/17/2011

## 2011-11-18 NOTE — Preoperative (Signed)
Beta Blockers   Reason not to administer Beta Blockers:Not Applicable 

## 2011-11-18 NOTE — Anesthesia Postprocedure Evaluation (Signed)
  Anesthesia Post-op Note  Patient: [name]  Procedure(s) Performed: Procedure(s) (LRB): IRRIGATION AND DEBRIDEMENT EXTREMITY (Left)  Patient Location: PACU  Anesthesia Type: General  Level of Consciousness: awake and alert   Airway and Oxygen Therapy: Patient Spontanous Breathing  Post-op Pain: mild  Post-op Assessment: Post-op Vital signs reviewed, Patient's Cardiovascular Status Stable, Respiratory Function Stable, Patent Airway and No signs of Nausea or vomiting  Post-op Vital Signs: stable  Complications: No apparent anesthesia complications

## 2011-11-18 NOTE — Interval H&P Note (Signed)
History and Physical Interval Note:  11/18/2011 12:08 PM  [name]  has presented today for surgery, with the diagnosis of Left Thigh Abscess  The various methods of treatment have been discussed with the patient and family. After consideration of risks, benefits and other options for treatment, the patient has consented to  Procedure(s) (LRB) with comments: IRRIGATION AND DEBRIDEMENT EXTREMITY (Left) - I & D Left Thigh as a surgical intervention .  The patient's history has been reviewed, patient examined, no change in status, stable for surgery.  I have reviewed the patient's chart and labs.  Questions were answered to the patient's satisfaction.     [name] A

## 2011-11-18 NOTE — Anesthesia Preprocedure Evaluation (Signed)
Anesthesia Evaluation  Patient identified by MRN, date of birth, ID band Patient awake    Reviewed: Allergy & Precautions, H&P , NPO status , Patient's Chart, lab work & pertinent test results  Airway Mallampati: II TM Distance: >3 FB Neck ROM: Full    Dental No notable dental hx.    Pulmonary neg pulmonary ROS,  breath sounds clear to auscultation  Pulmonary exam normal       Cardiovascular negative cardio ROS  Rhythm:Regular Rate:Normal     Neuro/Psych negative neurological ROS  negative psych ROS   GI/Hepatic negative GI ROS, Neg liver ROS,   Endo/Other  diabetes, Type 2, Oral Hypoglycemic Agents  Renal/GU negative Renal ROS  negative genitourinary   Musculoskeletal negative musculoskeletal ROS (+)   Abdominal (+) + obese,   Peds negative pediatric ROS (+)  Hematology negative hematology ROS (+)   Anesthesia Other Findings   Reproductive/Obstetrics negative OB ROS                           Anesthesia Physical Anesthesia Plan  ASA: II  Anesthesia Plan: General   Post-op Pain Management:    Induction: Intravenous  Airway Management Planned: LMA  Additional Equipment:   Intra-op Plan:   Post-operative Plan: Extubation in OR  Informed Consent: I have reviewed the patients History and Physical, chart, labs and discussed the procedure including the risks, benefits and alternatives for the proposed anesthesia with the patient or authorized representative who has indicated his/her understanding and acceptance.   Dental advisory given  Plan Discussed with: CRNA  Anesthesia Plan Comments: (Grade 3 glottic view in 04/26/11. NPO. Denies GERD.)        Anesthesia Quick Evaluation

## 2011-11-18 NOTE — Progress Notes (Signed)
Patient with CBG = 80. Snack given. No symptoms noted. Will monitor for low blood sugar. Agueda Houpt [place], [place] 11/18/2011 9:18 PM

## 2011-11-18 NOTE — Transfer of Care (Signed)
Immediate Anesthesia Transfer of Care Note  Patient: [name]  Procedure(s) Performed: Procedure(s) (LRB) with comments: IRRIGATION AND DEBRIDEMENT EXTREMITY (Left) - I & D Left Thigh (left knee and inner thigh)  Patient Location: PACU  Anesthesia Type:General  Level of Consciousness: awake, alert , oriented and patient cooperative  Airway & Oxygen Therapy: Patient Spontanous Breathing and Patient connected to face mask oxygen  Post-op Assessment: Report given to PACU RN and Post -op Vital signs reviewed and stable  Post vital signs: Reviewed and stable  Complications: No apparent anesthesia complications

## 2011-11-18 NOTE — Brief Op Note (Signed)
11/17/2011 - 11/18/2011  1:28 PM  PATIENT:  [name]  66 y.o. male  PRE-OPERATIVE DIAGNOSIS:  Left Thigh Abscess  POST-OPERATIVE DIAGNOSIS:  Left Thigh Abscess  PROCEDURE:  Procedure(s) (LRB) with comments: IRRIGATION AND DEBRIDEMENT EXTREMITY (Left) - I & D Left Thigh (left knee and inner thigh),Deep.  SURGEON:  Surgeon(s) and Role:    * Jacki Cones, MD - Primary    ASSISTANTS: OR Nurse  ANESTHESIA:   general  EBL:  Total I/O In: 1028.3 [I.V.:1028.3] Out: 1475 [Urine:1275; Blood:200]  BLOOD ADMINISTERED:none  DRAINS: Antibiotic- soaked Kerlix   LOCAL MEDICATIONS USED:  NONE  SPECIMEN:  Source of Specimen:  Left Thigh,sent for Cultures ,Aerobic and Anaerobic  DISPOSITION OF SPECIMEN:  PATHOLOGY  COUNTS:  YES  TOURNIQUET:  * No tourniquets in log *  DICTATION: .Other Dictation: Dictation Number [id number]  PLAN OF CARE: Admit to inpatient   PATIENT DISPOSITION:  PACU - hemodynamically stable.   Delay start of Pharmacological VTE agent (>24hrs) due to surgical blood loss or risk of bleeding: yes

## 2011-11-19 LAB — CBC
MCH: 27.5 pg (ref 26.0–34.0)
Platelets: 271 10*3/uL (ref 150–400)
RBC: 3.78 MIL/uL — ABNORMAL LOW (ref 4.22–5.81)
WBC: 5.8 10*3/uL (ref 4.0–10.5)

## 2011-11-19 LAB — GLUCOSE, CAPILLARY
Glucose-Capillary: 120 mg/dL — ABNORMAL HIGH (ref 70–99)
Glucose-Capillary: 136 mg/dL — ABNORMAL HIGH (ref 70–99)
Glucose-Capillary: 142 mg/dL — ABNORMAL HIGH (ref 70–99)

## 2011-11-19 MED ORDER — VANCOMYCIN HCL IN DEXTROSE 1-5 GM/200ML-% IV SOLN: 1000.0000 mg | Freq: Two times a day (BID) | INTRAVENOUS | Status: DC

## 2011-11-19 MED ORDER — VANCOMYCIN HCL IN DEXTROSE 1-5 GM/200ML-% IV SOLN
1000.0000 mg | Freq: Two times a day (BID) | INTRAVENOUS | Status: DC
Start: 2011-11-19 — End: 2011-11-21
  Administered 2011-11-19 – 2011-11-21 (×4): 1000 mg via INTRAVENOUS
  Filled 2011-11-19 (×5): qty 200

## 2011-11-19 NOTE — Evaluation (Signed)
Physical Therapy Evaluation Patient Details Name: [name] MRN: [id number] DOB: Mar 28, 1945 Today's Date: 11/19/2011 Time: 1500-1520 PT Time Calculation (min): 20 min  PT Assessment / Plan / Recommendation Clinical Impression  pt s/p I and D  of abscess left thigh after having had a sofa fall on his leg; Will follow for increased ROM and increased mobility safety; Pt had TKA approximately 7 mos ago and was doing great, had returned to independence.    PT Assessment  Patient needs continued PT services    Follow Up Recommendations  No PT follow up    Does the patient have the potential to tolerate intense rehabilitation      Barriers to Discharge        Equipment Recommendations  None recommended by PT    Recommendations for Other Services     Frequency Other (Comment) (will only need 1-2 more PT visits)    Precautions / Restrictions Precautions Precautions: None Restrictions LLE Weight Bearing: Weight bearing as tolerated   Pertinent Vitals/Pain       Mobility  Bed Mobility Bed Mobility: Supine to Sit Supine to Sit: 5: Supervision Transfers Transfers: Sit to Stand;Stand to Sit Sit to Stand: 4: Min guard Stand to Sit: 4: Min guard Details for Transfer Assistance: min/guard for safety, pt's first time up in 2 days; cues for hand placement Ambulation/Gait Ambulation/Gait Assistance: 4: Min guard;5: Supervision Ambulation Distance (Feet): 300 Feet Assistive device: Rolling walker Gait Pattern: Step-to pattern;Step-through pattern;Antalgic    Shoulder Instructions     Exercises General Exercises - Lower Extremity Heel Slides: AAROM;Right;10 reps   PT Diagnosis: Difficulty walking  PT Problem List: Decreased range of motion;Decreased balance;Decreased mobility;Pain;Decreased knowledge of use of DME PT Treatment Interventions: DME instruction;Gait training;Stair training;Functional mobility training;Therapeutic activities;Therapeutic exercise;Patient/family  education   PT Goals Acute Rehab PT Goals PT Goal Formulation: With patient Time For Goal Achievement: 11/24/11 Potential to Achieve Goals: Good Pt will go Sit to Stand: with modified independence PT Goal: Sit to Stand - Progress: Goal set today Pt will go Stand to Sit: with modified independence PT Goal: Stand to Sit - Progress: Goal set today Pt will Ambulate: >150 feet;with modified independence;with least restrictive assistive device PT Goal: Ambulate - Progress: Goal set today Pt will Go Up / Down Stairs: 1-2 stairs;with supervision;with least restrictive assistive device Pt will Perform Home Exercise Program: with supervision, verbal cues required/provided PT Goal: Perform Home Exercise Program - Progress: Goal set today  Visit Information  Last PT Received On: 11/19/11 Assistance Needed: +1    Subjective Data  Subjective: It felt like a [place] kicked me Patient Stated Goal: home   Prior Functioning  Home Living Type of Home: House Home Access: Stairs to enter Entrance Stairs-Number of Steps: 1 Home Layout: One level (one step inside) Home Adaptive Equipment: Walker - rolling;Bedside commode/3-in-1;Crutches Prior Function Level of Independence: Independent Able to Take Stairs?: Yes    Cognition  Overall Cognitive Status: Appears within functional limits for tasks assessed/performed Arousal/Alertness: Awake/alert Orientation Level: Appears intact for tasks assessed Behavior During Session: [facility] for tasks performed    Extremity/Trunk Assessment Right Upper Extremity Assessment RUE ROM/Strength/Tone: [facility] for tasks assessed Left Upper Extremity Assessment LUE ROM/Strength/Tone: [facility] for tasks assessed Right Lower Extremity Assessment RLE ROM/Strength/Tone: [facility] for tasks assessed Left Lower Extremity Assessment LLE ROM/Strength/Tone: Deficits;Due to pain LLE ROM/Strength/Tone Deficits: grossly WFL hip flexion and knee extension,  knee flexion 3+/5; ankle WFL; AAROM  15-65* grossly,  due to swelling and bandaging  Balance    End of Session PT - End of Session Activity Tolerance: Patient tolerated treatment well Patient left: in chair;with call bell/phone within reach  GP     [facility] 11/19/2011, 4:07 PM

## 2011-11-19 NOTE — Progress Notes (Signed)
[name]  MRN: [id number] DOB/Age: May 25, 1945 66 y.o. Physician: Lynnea Maizes, M.D. 1 Day Post-Op Procedure(s) (LRB): IRRIGATION AND DEBRIDEMENT EXTREMITY (Left)  Subjective: mini62mal pain, good appetite, wants to know when he will be able to go home Vital Signs Temp:  [98.4 F (36.9 C)-99.6 F (37.6 C)] 98.5 F (36.9 C) (11/10 0455) Pulse Rate:  [62-73] 68  (11/10 0455) Resp:  [12-20] 16  (11/10 0800) BP: (100-151)/(60-77) 120/67 mmHg (11/10 0455) SpO2:  [95 %-100 %] 99 % (11/10 0800)  Lab Results  Basename 11/19/11 0448  WBC 5.8  HGB 10.4*  HCT 32.5*  PLT 271   BMET  Basename 11/17/11 1900  NA 133*  K 3.8  CL 97  CO2 25  GLUCOSE 168*  BUN 13  CREATININE 0.99  CALCIUM 9.5   INR  Date Value Range Status  11/17/2011 1.11  0.00 - 1.49 Final     Exam  Left thigh dressings clean and dry, N/V intact distally. Cx and gram stains pending  Plan Continue abx, dressing change in AM per Dr. Fayne Mediate M 11/19/2011, 9:39 AM

## 2011-11-19 NOTE — Op Note (Signed)
NAMEJAVIAN, [name] NO.:  [id number]  MEDICAL RECORD NO.:  [id number]  LOCATION:  1611                         FACILITY:  [facility]  PHYSICIAN:  Georges Lynch. Elvie Palomo, M.D.DATE OF BIRTH:  Dec 28, 1945  DATE OF PROCEDURE:  11/18/2011 DATE OF DISCHARGE:                              OPERATIVE REPORT   SURGEON:  Georges Lynch. Verlaine Embry, MD  ASSISTANT:  OR nurse.  PREOPERATIVE DIAGNOSES: 1. Traumatic abscess of the left thigh. 2. 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  He said a couch struck him on the inner side of his left thigh and has felt worse than a "mule kicking him."  This happened 9 days ago.  He arrived to my office 9 days later with a tender, swollen left thigh.  I aspirated that and send the specimen for cultures for aerobic, anaerobic, and directly admitted him to the hospital from the office for surgery today.  POSTOPERAIVE DIAGNOSES: 1. Traumatic abscess of the left thigh. 2. Seven months postop left total knee arthroplasty.  OPERATION: 1. Incision and drainage of deep thigh abscess on the left. 2. Debridement and irrigation. 3. Insertion of a antibiotic soaked Kerlix dressing.  PROCEDURE:  Under general anesthesia, I did initial prep of his left thigh region and left knee region followed by sterile prep.  Appropriate time-out was carried out before any incisions were made and I also marked the appropriate left leg in the holding area.  At this time, no tourniquet was used.  I did a superficial debridement of the necrotic superficial layers of skin.  After that was done, I went ahead and made an incision over the medial aspect of his thigh above the knee joint and a large amount of necrotic versus purulent material with clot was removed.  I then thoroughly debrided the area utilizing a rongeur.  This area involved his deep thigh, did not appear to enter the knee joint.  I went up under the quadriceps mechanism up under the subcu, just  above the patella area and dissected all that free, made sure there were no pockets of pus.  I then thoroughly water picked the knee out to 3000 mL of saline solution.  I then utilized antibiotic irrigation following that.  I then loosely applied a antibiotic soaked Kerlix in to the wound and left part of the wound open and closed small portions of the wound with #1 nylon suture.  Basically, we left a good portion of the wound open.  I then applied a sterile dressing.  He was on vancomycin preop and he was also given 2 g of IV Ancef in the operating room.  Note, we also took cultures for aerobic and anaerobic, they were sent initially, also I did take cultures for aerobic and anaerobic in the office prior to antibiotics.  The patient returned to the recovery room in satisfactory condition.          ______________________________ Georges Lynch Darrelyn Hillock, M.D.     RAG/MEDQ  D:  11/18/2011  T:  11/18/2011  Job:  [id number]

## 2011-11-20 ENCOUNTER — Encounter (HOSPITAL_COMMUNITY): Payer: Self-pay | Admitting: Orthopedic Surgery

## 2011-11-20 DIAGNOSIS — L02419 Cutaneous abscess of limb, unspecified: Secondary | ICD-10-CM

## 2011-11-20 DIAGNOSIS — L03119 Cellulitis of unspecified part of limb: Secondary | ICD-10-CM

## 2011-11-20 LAB — CREATININE, SERUM
GFR calc Af Amer: >90 mL/min (ref >90)
GFR calc non Af Amer: 86 mL/min — ABNORMAL LOW (ref >90)

## 2011-11-20 LAB — CBC
Hemoglobin: 10.9 g/dL — ABNORMAL LOW (ref 13.0–17.0)
MCH: 27.6 pg (ref 26.0–34.0)
RBC: 3.95 MIL/uL — ABNORMAL LOW (ref 4.22–5.81)

## 2011-11-20 LAB — GLUCOSE, CAPILLARY
Glucose-Capillary: 114 mg/dL — ABNORMAL HIGH (ref 70–99)
Glucose-Capillary: 108 mg/dL — ABNORMAL HIGH (ref 70–99)

## 2011-11-20 NOTE — Progress Notes (Signed)
Utilization review completed.  

## 2011-11-20 NOTE — Clinical Documentation Improvement (Signed)
EXCISIONAL DEBRIDEMENT DOCUMENTATION CLARIFICATION  THIS DOCUMENT IS NOT A PERMANENT PART OF THE MEDICAL RECORD  TO RESPOND TO THE THIS QUERY, FOLLOW THE INSTRUCTIONS BELOW:  1. If needed, update documentation for the patient's encounter via the notes activity.  2. Access this query again and click edit on the In [employer].  3. After updating, or not, click F2 to complete all highlighted (required) fields concerning your review. Select "additional documentation in the medical record" OR "no additional documentation provided".  4. Click Sign note button.  5. The deficiency will fall out of your In Basket *Please let us know if you are not able to complete this workflow by phone or e-mail (listed below).  Please update your documentation within the medical record to reflect your response to this query.                                                                                        11/20/11   Dear Dr. Darrelyn Hillock, [facility],  In a better effort to capture your patient's severity of illness, reflect appropriate length of stay and utilization of resources, a review of the patient medical record has revealed the following indicators.    Based on your clinical judgment, please clarify and document in a progress note and/or discharge summary the clinical condition associated with the following supporting information:  In responding to this query please exercise your independent judgment.  The fact that a query is asked, does not imply that any particular answer is desired or expected.   Because of the documentation in the medical record of "debridement" clarification is needed.  In this patient admitted with thigh abscess a review of the medical record reveals that several documentation clarifications are needed:   Op note stated the the following:  "I did a superficial debridement of the necrotic superficial layers of skin."  Clarification Needed:  Note size of debridment  and   Note instrument used to debride "superficial layers of skin."   Op note stated the the following:   "I went ahead and made an incision over the medial aspect of his thigh above the knee joint and a large amount of necrotic versus purulent material with clot was removed."  Clarification Needed:   Type of instrument used to make the "incision" to the medial aspect of thigh  Note the size of the incision.    Op note stated the the following:  "I then thoroughly debrided the area utilizing a rongeur...."  "I went up under the quadriceps mechanism up under the subcu, just above         the patella area and dissected all that free, made sure there were no pockets of pus."   Clarification Needed:  If debridement utilizing the rongeur can qualify for "excisional debridement" or other type of debridement (see definitions of terms listed below)   Please document the below (4) key elements in a progress note:  1.  Type of Debridement:          Excisional Debridement-  Cutting away necrotic, devitalized tissue or slough to  Level of viable tissue using a sharp instrument (example,  scalpel, scissors, etc).           NON Excisional Debridement-  The removal of necrotic, devitalized tissue or slough by means of scraping, mechanical brushing, flushing, or washing. (example: irrigation, whirlpool);  Minor removal of loose fragments  2.  Please indicate instrument used:  Scissors, Scalpel, Curette, or other  3.  Please document depth of the debridement:             Partial Thickness  Full Thickness  Skin and Subcutaneous Tissue  Subcutaneous Tissue and Muscle  Subcutaneous Tissue, Muscle and Bone  Other  4.  Document the size of the debridement.  You may use possible, probable, or suspect with inpatient documentation. possible, probable, suspected diagnoses MUST be documented at the time of discharge  Reviewed: additional documentation in the medical record ljh  Thank  You,  Enis Slipper RN, BSN, MSN/Inf, CCDS Clinical Documentation Specialist [facility] Dept Pager: [phone] / E-mail: [email or web address]  Health Information Management [place]

## 2011-11-20 NOTE — Progress Notes (Signed)
Subjective: Staph. Cultured out of thigh wound in Surgery on Saturday. Drain pulled this morning and no drainage. Wound looks better. Plan on ID consult and PIC Line.He is afebrile and his WBC is normal. My Main concern is that he has a Total Knee on that side.His joint was not involved.   Objective: Vital signs in last 24 hours: Temp:  [97.8 F (36.6 C)-99.3 F (37.4 C)] 99.3 F (37.4 C) (11/11 0545) Pulse Rate:  [52-63] 63  (11/11 0545) Resp:  [16-20] 20  (11/11 0545) BP: (100-123)/(60-74) 122/74 mmHg (11/11 0545) SpO2:  [97 %-99 %] 98 % (11/11 0545)  Intake/Output from previous day: 11/10 0701 - 11/11 0700 In: 4141.7 [P.O.:3440; I.V.:701.7] Out: 5150 [Urine:5150] Intake/Output this shift:     Basename 11/20/11 0440 11/19/11 0448  HGB 10.9* 10.4*    Basename 11/20/11 0440 11/19/11 0448  WBC 5.3 5.8  RBC 3.95* 3.78*  HCT 33.7* 32.5*  PLT 301 271    Basename 11/17/11 1900  NA 133*  K 3.8  CL 97  CO2 25  BUN 13  CREATININE 0.99  GLUCOSE 168*  CALCIUM 9.5    Basename 11/17/11 1900  LABPT --  INR 1.11    Wound looks better today.  Assessment/Plan: Infectious Disease Consult.   Zissel Biederman A 11/20/2011, 7:36 AM

## 2011-11-20 NOTE — Progress Notes (Signed)
Physical Therapy Treatment Patient Details Name: [name] MRN: [id number] DOB: 30-Jan-1945 Today's Date: 11/20/2011 Time: 4098-1191 PT Time Calculation (min): 12 min  PT Assessment / Plan / Recommendation Comments on Treatment Session  Pt progressing well with little c/o pain.  Awaiting PICC line placement then D/C to home.    Follow Up Recommendations  No PT follow up     Does the patient have the potential to tolerate intense rehabilitation     Barriers to Discharge        Equipment Recommendations  None recommended by PT;None recommended by OT    Recommendations for Other Services    Frequency Other (Comment) (will only need one more PT visit to address stairs)   Plan Discharge plan remains appropriate    Precautions / Restrictions Precautions Precautions: None Restrictions Weight Bearing Restrictions: No LLE Weight Bearing: Weight bearing as tolerated   Pertinent Vitals/Pain "feels okay'   Mobility  Bed Mobility Bed Mobility: Supine to Sit;Sitting - Scoot to Edge of Bed Supine to Sit: 5: Supervision Sitting - Scoot to Edge of Bed: 5: Supervision Details for Bed Mobility Assistance: only required inceased time  Transfers Transfers: Sit to Stand;Stand to Sit Sit to Stand: 6: Modified independent (Device/Increase time);From bed Stand to Sit: 6: Modified independent (Device/Increase time);To chair/3-in-1 Details for Transfer Assistance: increased time  Ambulation/Gait Ambulation/Gait Assistance: 5: Supervision Ambulation Distance (Feet): 285 Feet Assistive device: Rolling walker Ambulation/Gait Assistance Details: ponly required increased time.  States his knee feels "okay". Gait Pattern: Step-through pattern;Decreased stride length Gait velocity: decreased     PT Goals                                                         progressing    Visit Information  Last PT Received On: 11/20/11 Assistance Needed: +1    Subjective Data  Subjective: I feel  good Patient Stated Goal: home   Cognition  Overall Cognitive Status: Appears within functional limits for tasks assessed/performed Arousal/Alertness: Awake/alert Orientation Level: Appears intact for tasks assessed Behavior During Session: [facility] for tasks performed    Balance   good  End of Session PT - End of Session Equipment Utilized During Treatment: Gait belt Activity Tolerance: Patient tolerated treatment well Patient left: in chair;with call bell/phone within reach   [name]  PTA [facility]  Acute  Rehab Pager     [phone]

## 2011-11-20 NOTE — Care Management Note (Signed)
  Page 1 of 1   11/20/2011     4:47:39 PM   CARE MANAGEMENT NOTE 11/20/2011  Patient:  JOURDAIN, GUAY   Account Number:  [id number]  Date Initiated:  11/20/2011  Documentation initiated by:  Colleen Can  Subjective/Objective Assessment:   dx ttraumatic abscess left thigh; Incision & drainage, debridement & irrigation, placemnt antibiotic soaked kerlix  s/p left knee replacement 04/13     Action/Plan:   Cm spoke with patient. Plans are for patient to return to his home in [place], [place] States his landlord and sister will offer assistance. Already has RW/   Anticipated DC Date:  11/20/2011   Anticipated DC Plan:  HOME W HOME HEALTH SERVICES  In-house referral  NA      DC Planning Services  CM consult      [facility] Choice  HOME HEALTH   Choice offered to / List presented to:  C-1 Patient           Status of service:  In process, will continue to follow Medicare Important Message given?   (If response is "NO", the following Medicare IM given date fields will be blank) Date Medicare IM given:   Date Additional Medicare IM given:    Discharge Disposition:    Per UR Regulation:    If discussed at Long Length of Stay Meetings, dates discussed:    Comments:  11/20/2011 Colleen Can BSN RN CCM [phone] CM  will follow for [facility] orders.

## 2011-11-20 NOTE — Progress Notes (Signed)
ANTIBIOTIC CONSULT NOTE - INITIAL  Pharmacy Consult for Vancomycin  Indication: Thigh Abscess  Allergies  Allergen Reactions  . Iohexol      Code: RASH, Onset Date: [id number]     Patient Measurements: Height: 5' 10.87" (180 cm) Weight: 227 lb (102.967 kg) (weighed pt at the hospital) IBW/kg (Calculated) : 74.99    Vital Signs: Temp: 99.3 F (37.4 C) (11/11 0545) Temp src: Oral (11/11 0545) BP: 122/74 mmHg (11/11 0545) Pulse Rate: 63  (11/11 0545) Intake/Output from previous day: 11/10 0701 - 11/11 0700 In: 4141.7 [P.O.:3440; I.V.:701.7] Out: 5150 [Urine:5150] Intake/Output from this shift: Total I/O In: 360 [P.O.:360] Out: 1000 [Urine:1000]  Labs:  [facility] 11/20/11 0440 11/19/11 0448 11/17/11 1900  WBC 5.3 5.8 --  HGB 10.9* 10.4* --  PLT 301 271 --  LABCREA -- -- --  CREATININE 0.91 -- 0.99   Estimated Creatinine Clearance: 96 ml/min (by C-G formula based on Cr of 0.91). No results found for this basename: VANCOTROUGH:2,VANCOPEAK:2,VANCORANDOM:2,GENTTROUGH:2,GENTPEAK:2,GENTRANDOM:2,TOBRATROUGH:2,TOBRAPEAK:2,TOBRARND:2,AMIKACINPEAK:2,AMIKACINTROU:2,AMIKACIN:2, in the last 72 hours   Microbiology: Recent Results (from the past 720 hour(s))  SURGICAL PCR SCREEN     Status: Normal   Collection Time   11/17/11 10:01 PM      Component Value Range Status Comment   MRSA, PCR NEGATIVE  NEGATIVE Final    Staphylococcus aureus NEGATIVE  NEGATIVE Final   WOUND CULTURE     Status: Normal (Preliminary result)   Collection Time   11/18/11 12:45 PM      Component Value Range Status Comment   Specimen Description ABSCESS LEFT THIGH   Final    Special Requests NONE   Final    Gram Stain     Final    Value: ABUNDANT WBC PRESENT, PREDOMINANTLY PMN     NO SQUAMOUS EPITHELIAL CELLS SEEN     MODERATE GRAM POSITIVE COCCI IN CLUSTERS   Culture     Final    Value: MODERATE STAPHYLOCOCCUS AUREUS     Note: RIFAMPIN AND GENTAMICIN SHOULD NOT BE USED AS SINGLE DRUGS FOR TREATMENT OF  STAPH INFECTIONS.   Report Status PENDING   Incomplete   ANAEROBIC CULTURE     Status: Normal (Preliminary result)   Collection Time   11/18/11 12:45 PM      Component Value Range Status Comment   Specimen Description ABSCESS LEFT THIGH   Final    Special Requests NONE   Final    Gram Stain     Final    Value: MODERATE WBC PRESENT, PREDOMINANTLY PMN     NO SQUAMOUS EPITHELIAL CELLS SEEN     MODERATE GRAM POSITIVE COCCI IN CLUSTERS   Culture     Final    Value: NO ANAEROBES ISOLATED; CULTURE IN PROGRESS FOR 5 DAYS   Report Status PENDING   Incomplete     Medical History: Past Medical History  Diagnosis Date  . Arthritis   . Diabetes mellitus   . Cancer     L EYE - MELANOMA    Medications:  Scheduled:     . insulin aspart  0-15 Units Subcutaneous TID WC  . linagliptin  5 mg Oral Daily  . pioglitazone  45 mg Oral Q breakfast  . rivaroxaban  10 mg Oral Q breakfast  . vancomycin  1,000 mg Intravenous Q12H   Infusions:     . lactated ringers Stopped (11/20/11 0200)   PRN: acetaminophen, acetaminophen, ALPRAZolam, alum & mag hydroxide-simeth, bisacodyl, HYDROcodone-acetaminophen, HYDROmorphone (DILAUDID) injection, menthol-cetylpyridinium, methocarbamol (ROBAXIN) IV, methocarbamol, ondansetron (ZOFRAN)  IV, ondansetron, oxyCODONE-acetaminophen, phenol, polyethylene glycol, polyvinyl alcohol   Assessment:  66 yo M with L thigh abscess, s/p I & D on 11/9.  Now on D#4 Vancomycin 1 gram IV q12h.  Culture from abscess is growing Staph aureus with susceptibilities pending.  Serum creatinine remains stable.  Plans for Infectious Diseases consult noted.  Goal of Therapy:  Vancomycin trough level 15-20 mcg/ml Eradication of infection  Plan:  1.) Continue Vancomycin at present dosage pending ID consult and report of susceptibilities for the Staph aureus isolate. 2.) If Staph aureus isolate is methicillin resistant or if continued vancomycin therapy is needed for some other  reason, will check trough and readjust dosage as needed.  Elie Goody, PharmD, BCPS Pager: [phone] 11/20/2011  12:12 PM

## 2011-11-20 NOTE — Evaluation (Signed)
Occupational Therapy Evaluation Patient Details Name: [name] MRN: [id number] DOB: [birth year] Today's Date: 11/20/2011 Time: 4098-1191 OT Time Calculation (min): 9 min  OT Assessment / Plan / Recommendation Clinical Impression  Pt admitted with abscess L thigh and s/p I & D and did well with all functional tasks requestes. Pt has intermittant assist at discharge (for laundry, meals, etc). No further OT needs.    OT Assessment  Patient does not need any further OT services    Follow Up Recommendations  No OT follow up    Barriers to Discharge      Equipment Recommendations  None recommended by PT;None recommended by OT    Recommendations for Other Services    Frequency       Precautions / Restrictions Precautions Precautions: None Restrictions Weight Bearing Restrictions: No LLE Weight Bearing: Weight bearing as tolerated        ADL  Eating/Feeding: Simulated;Independent Where Assessed - Eating/Feeding: Chair Grooming: Simulated;Wash/dry hands;Independent Where Assessed - Grooming: Unsupported standing Upper Body Bathing: Simulated;Chest;Right arm;Left arm;Abdomen;Independent Where Assessed - Upper Body Bathing: Unsupported standing Lower Body Bathing: Simulated;Independent Where Assessed - Lower Body Bathing: Unsupported standing Upper Body Dressing: Simulated;Independent Where Assessed - Upper Body Dressing: Unsupported sitting Lower Body Dressing: Simulated;Modified independent Where Assessed - Lower Body Dressing: Supported sit to Pharmacist, hospital: Performed;Modified independent Toilet Transfer Equipment: Raised toilet seat with arms (or 3-in-1 over toilet);Other (comment) (pushed iv pole. did not rely on it heavily. declined need RW) Toileting - Clothing Manipulation and Hygiene: Simulated;Modified independent Where Assessed - Toileting Clothing Manipulation and Hygiene: Sit to stand from 3-in-1 or toilet Tub/Shower Transfer: Simulated;Modified  independent ADL Comments: pt doing well. able to reach down to bilateral foot to don/doff docks. no balance problems noted in standing with ADL and functional transfers. He declined needing to use RW. States he was up earlier without it. Pushed his own IV pole but didnt rely on it heavily for balance.     OT Diagnosis:    OT Problem List:   OT Treatment Interventions:     OT Goals    Visit Information  Last OT Received On: 11/20/11 Assistance Needed: +1    Subjective Data  Subjective: I try to not let much get me down Patient Stated Goal: home when able   Prior Functioning     Home Living Lives With: Alone Available Help at Discharge: Other (Comment) (has friends that can check in) Type of Home: House Home Access: Stairs to enter [employer] of Steps: 1 Home Layout: One level (one step inside) Bathroom Shower/Tub: Health visitor: Handicapped height Home Adaptive Equipment: Walker - rolling;Bedside commode/3-in-1;Crutches Prior Function Level of Independence: Independent;Needs assistance Needs Assistance: Light Housekeeping Light Housekeeping:  (friend does laundry) Able to Take Stairs?: Yes Communication Communication: No difficulties         Vision/Perception     Cognition  Overall Cognitive Status: Appears within functional limits for tasks assessed/performed Arousal/Alertness: Awake/alert Orientation Level: Appears intact for tasks assessed Behavior During Session: [facility] for tasks performed    Extremity/Trunk Assessment Right Upper Extremity Assessment RUE ROM/Strength/Tone: [facility] for tasks assessed Left Upper Extremity Assessment LUE ROM/Strength/Tone: WFL for tasks assessed     Mobility Transfers Transfers: Sit to Stand;Stand to Sit Sit to Stand: 6: Modified independent (Device/Increase time);With upper extremity assist;From chair/3-in-1 Stand to Sit: 6: Modified independent (Device/Increase time);With upper extremity assist;To  chair/3-in-1     Shoulder Instructions     Exercise     Balance  End of Session OT - End of Session Activity Tolerance: Patient tolerated treatment well Patient left: in chair;with call bell/phone within reach  GO     [name] [phone] 11/20/2011, 12:01 PM

## 2011-11-20 NOTE — Consult Note (Signed)
[facility]    Date of Admission:  11/17/2011  Date of Consult:  11/20/2011  Reason for Consult: Leg abscess that is near prosthetic knee Referring Physician: DR  Darrelyn Hillock   HPI:  [name] is an 66 y.o. male  who had a prosthetic left knee replacement 7 months ago. Approximately a week prior to admission he had been moving a couch when it struck his knee. developed increasing pain in his knee and thigh. He was admitted on November 8 and underwent incision and debridement of a thigh abscess by Dr. Darrelyn Hillock. Intraoperative cultures have been sent and now are growing Staphylococcus aureus with sensitivity data pending. Dr.GIOFFRE  specifically stated that the infection did not extend to involve the joint space itself, is understandably wanted to make sure that it is controlled and does not extend to involve this prosthetic knee. Therefore we were asked to see the patient to assist in management work this patient with 5 abscess near a prosthetic knee note in reviewing the patient's allergies he states that he develops pure right [place] with penicillin. I would note that he was given cephalexin previously after discharge the hospital with his prosthetic knee surgery and appears to have no problems with the cephalosporin.   Past Medical History  Diagnosis Date  . Arthritis   . Diabetes mellitus   . Cancer     L EYE - MELANOMA    Past Surgical History  Procedure Date  . Knee arthroscopy 08/2010  . 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 Eye surgery     RETINA SURG RT EYE  . 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 Total knee arthroplasty 04/26/2011    Procedure: TOTAL KNEE ARTHROPLASTY;  Surgeon: Jacki Cones, MD;  Location: [facility];  Service: Orthopedics;  Laterality: Left;  . I&d extremity 11/18/2011    Procedure: IRRIGATION AND DEBRIDEMENT EXTREMITY;  Surgeon: Jacki Cones, MD;  Location: [facility];  Service: Orthopedics;  Laterality:  Left;  I & D Left Thigh (left knee and inner thigh)  ergies:   Allergies  Allergen Reactions  . Iohexol      Code: RASH, Onset Date: [id number]   . Penicillins Itching    PATIENT TOLERATED KEFLEX     Medications: I have reviewed patients current medications as documented in Epic Anti-infectives     Start     Dose/Rate Route Frequency Ordered Stop   11/19/11 1800   vancomycin (VANCOCIN) IVPB 1000 mg/200 mL premix  Status:  Discontinued     Comments: Pharmacy Protocol      1,000 mg 200 mL/hr over 60 Minutes Intravenous Every 12 hours 11/19/11 0940 11/19/11 0942   11/19/11 1800   vancomycin (VANCOCIN) IVPB 1000 mg/200 mL premix     Comments: Pharmacy Protocol      1,000 mg 200 mL/hr over 60 Minutes Intravenous Every 12 hours 11/19/11 0942     11/19/11 0600   vancomycin (VANCOCIN) IVPB 1000 mg/200 mL premix     Comments: Pharmacy Protocol      1,000 mg 200 mL/hr over 60 Minutes Intravenous Every 12 hours 11/18/11 1422 11/19/11 0611   11/18/11 1700   vancomycin (VANCOCIN) 2,000 mg in sodium chloride 0.9 % 500 mL IVPB        2,000 mg 250 mL/hr over 120 Minutes Intravenous  Once 11/18/11 1422 11/18/11 1952   11/18/11 1244  polymyxin B 500,000 Units, bacitracin 50,000 Units in sodium chloride irrigation 0.9 % 500 mL irrigation  Status:  Discontinued          As needed 11/18/11 1245 11/18/11 1315   11/18/11 0800   vancomycin (VANCOCIN) IVPB 1000 mg/200 mL premix  Status:  Discontinued        1,000 mg 200 mL/hr over 60 Minutes Intravenous Every 12 hours 11/17/11 1953 11/18/11 1433   11/17/11 1930   vancomycin (VANCOCIN) IVPB 1000 mg/200 mL premix  Status:  Discontinued        1,000 mg 200 mL/hr over 60 Minutes Intravenous  Once 11/17/11 1854 11/17/11 1854   11/17/11 1930   vancomycin (VANCOCIN) 2,000 mg in sodium chloride 0.9 % 500 mL IVPB        2,000 mg 250 mL/hr over 120 Minutes Intravenous  Once 11/17/11 1854 11/18/11 0011   11/17/11 1842   ceFAZolin (ANCEF) IVPB 2 g/50 mL  premix        2 g 100 mL/hr over 30 Minutes Intravenous 60 min pre-op 11/17/11 1843 11/18/11 1226          Social History:  reports that he has never smoked. His smokeless tobacco use includes Chew. He reports that he drinks alcohol. He reports that he does not use illicit drugs.  History reviewed. No pertinent family history.  As in HPI and primary teams notes otherwise 12 point review of systems is negative  Blood pressure 150/75, pulse 73, temperature 97.8 F (36.6 C), temperature source Oral, resp. rate 14, height 5' 10.87" (1.8 m), weight 227 lb (102.967 kg), SpO2 97.00%. General: Alert and awake, oriented x3, not in any acute distress. HEENT: anicteric sclera, pupils reactive to light and accommodation, EOMI, oropharynx clear and without exudate CVS regular rate, normal r,  no murmur rubs or gallops Chest: clear to auscultation bilaterally, no wheezing, rales or rhonchi Abdomen: soft nontender, nondistended, normal bowel sounds, Extremities: Left knee bandaged Skin: no rashes Neuro: nonfocal, strength and sensation intact   Results for orders placed during the hospital encounter of 11/17/11 (from the past 48 hour(s))  GLUCOSE, CAPILLARY     Status: Abnormal   Collection Time   11/18/11  4:19 PM      Component Value Range Comment   Glucose-Capillary 199 (*) 70 - 99 mg/dL   GLUCOSE, CAPILLARY     Status: Normal   Collection Time   11/18/11  8:49 PM      Component Value Range Comment   Glucose-Capillary 80  70 - 99 mg/dL    Comment 1 Documented in Chart      Comment 2 Notify RN     CBC     Status: Abnormal   Collection Time   11/19/11  4:48 AM      Component Value Range Comment   WBC 5.8  4.0 - 10.5 K/uL    RBC 3.78 (*) 4.22 - 5.81 MIL/uL    Hemoglobin 10.4 (*) 13.0 - 17.0 g/dL    HCT 40.9 (*) 81.1 - 52.0 %    MCV 86.0  78.0 - 100.0 fL    MCH 27.5  26.0 - 34.0 pg    MCHC 32.0  30.0 - 36.0 g/dL    RDW 91.4 (*) 78.2 - 15.5 %    Platelets 271  150 - 400 K/uL     GLUCOSE, CAPILLARY     Status: Abnormal   Collection Time   11/19/11  7:36 AM  Component Value Range Comment   Glucose-Capillary 120 (*) 70 - 99 mg/dL    Comment 1 Documented in Chart      Comment 2 Notify RN     GLUCOSE, CAPILLARY     Status: Abnormal   Collection Time   11/19/11 11:57 AM      Component Value Range Comment   Glucose-Capillary 116 (*) 70 - 99 mg/dL    Comment 1 Documented in Chart      Comment 2 Notify RN     GLUCOSE, CAPILLARY     Status: Abnormal   Collection Time   11/19/11  5:53 PM      Component Value Range Comment   Glucose-Capillary 136 (*) 70 - 99 mg/dL    Comment 1 Documented in Chart      Comment 2 Notify RN     GLUCOSE, CAPILLARY     Status: Abnormal   Collection Time   11/19/11  9:33 PM      Component Value Range Comment   Glucose-Capillary 142 (*) 70 - 99 mg/dL    Comment 1 Documented in Chart      Comment 2 Notify RN     CBC     Status: Abnormal   Collection Time   11/20/11  4:40 AM      Component Value Range Comment   WBC 5.3  4.0 - 10.5 K/uL    RBC 3.95 (*) 4.22 - 5.81 MIL/uL    Hemoglobin 10.9 (*) 13.0 - 17.0 g/dL    HCT 16.1 (*) 09.6 - 52.0 %    MCV 85.3  78.0 - 100.0 fL    MCH 27.6  26.0 - 34.0 pg    MCHC 32.3  30.0 - 36.0 g/dL    RDW 04.5  40.9 - 81.1 %    Platelets 301  150 - 400 K/uL   CREATININE, SERUM     Status: Abnormal   Collection Time   11/20/11  4:40 AM      Component Value Range Comment   Creatinine, Ser 0.91  0.50 - 1.35 mg/dL    GFR calc non Af Amer 86 (*) >90 mL/min    GFR calc Af Amer >90  >90 mL/min   GLUCOSE, CAPILLARY     Status: Abnormal   Collection Time   11/20/11  7:53 AM      Component Value Range Comment   Glucose-Capillary 129 (*) 70 - 99 mg/dL       Component Value Date/Time   SDES ABSCESS LEFT THIGH 11/18/2011 1245   SDES ABSCESS LEFT THIGH 11/18/2011 1245   SPECREQUEST NONE 11/18/2011 1245   SPECREQUEST NONE 11/18/2011 1245   CULT  Value: MODERATE STAPHYLOCOCCUS AUREUS Note: RIFAMPIN AND  GENTAMICIN SHOULD NOT BE USED AS SINGLE DRUGS FOR TREATMENT OF STAPH INFECTIONS. 11/18/2011 1245   CULT NO ANAEROBES ISOLATED; CULTURE IN PROGRESS FOR 5 DAYS 11/18/2011 1245   REPTSTATUS PENDING 11/18/2011 1245   REPTSTATUS PENDING 11/18/2011 1245   No results found.   Recent Results (from the past 720 hour(s))  SURGICAL PCR SCREEN     Status: Normal   Collection Time   11/17/11 10:01 PM      Component Value Range Status Comment   MRSA, PCR NEGATIVE  NEGATIVE Final    Staphylococcus aureus NEGATIVE  NEGATIVE Final   WOUND CULTURE     Status: Normal (Preliminary result)   Collection Time   11/18/11 12:45 PM      Component Value Range Status Comment  Specimen Description ABSCESS LEFT THIGH   Final    Special Requests NONE   Final    Gram Stain     Final    Value: ABUNDANT WBC PRESENT, PREDOMINANTLY PMN     NO SQUAMOUS EPITHELIAL CELLS SEEN     MODERATE GRAM POSITIVE COCCI IN CLUSTERS   Culture     Final    Value: MODERATE STAPHYLOCOCCUS AUREUS     Note: RIFAMPIN AND GENTAMICIN SHOULD NOT BE USED AS SINGLE DRUGS FOR TREATMENT OF STAPH INFECTIONS.   Report Status PENDING   Incomplete   ANAEROBIC CULTURE     Status: Normal (Preliminary result)   Collection Time   11/18/11 12:45 PM      Component Value Range Status Comment   Specimen Description ABSCESS LEFT THIGH   Final    Special Requests NONE   Final    Gram Stain     Final    Value: MODERATE WBC PRESENT, PREDOMINANTLY PMN     NO SQUAMOUS EPITHELIAL CELLS SEEN     MODERATE GRAM POSITIVE COCCI IN CLUSTERS   Culture     Final    Value: NO ANAEROBES ISOLATED; CULTURE IN PROGRESS FOR 5 DAYS   Report Status PENDING   Incomplete      Impression/Recommendation  66 year old man who had prosthetic knee replacement 7 months ago now with Staphylococcus aureus abscess in thigh area near knee joint but not involving the knee joint.  #1 Staphylococcus aureus thigh abscess:  Awaiting sensitivity data I would agree with continuing  vancomycin. If this turns out to be methicillin sensitive organism we can change to intravenous cefazolin while he is an inpatient. As long as the joint itself is not infected I do not see an absolute indication for continuing intravenous antibiotics but I'm also not opposed to using them for some period of time. Also check a sedimentation rate and C-reactive protein.   #2 infection prevention: Place the patient in contact cautions pending the sensitivity data on his Staphylococcus aureus.  #3 screening  screening for HIV   Thank you so much for this interesting consult  [facility] [phone] (pager) [phone] (office) 11/20/2011, 4:13 PM  Paulette Blanch Dam 11/20/2011, 4:13 PM

## 2011-11-21 LAB — GLUCOSE, CAPILLARY: Glucose-Capillary: 113 mg/dL — ABNORMAL HIGH (ref 70–99)

## 2011-11-21 LAB — SEDIMENTATION RATE: Sed Rate: 82 mm/hr — ABNORMAL HIGH (ref 0–16)

## 2011-11-21 LAB — CBC
Hemoglobin: 11.1 g/dL — ABNORMAL LOW (ref 13.0–17.0)
RBC: 4.02 MIL/uL — ABNORMAL LOW (ref 4.22–5.81)

## 2011-11-21 LAB — C-REACTIVE PROTEIN: CRP: 2.9 mg/dL — ABNORMAL HIGH (ref <0.60)

## 2011-11-21 LAB — HIV ANTIBODY (ROUTINE TESTING W REFLEX): HIV: NONREACTIVE

## 2011-11-21 LAB — WOUND CULTURE

## 2011-11-21 MED ORDER — VANCOMYCIN HCL 1000 MG IV SOLR
1250.0000 mg | Freq: Two times a day (BID) | INTRAVENOUS | Status: DC
  Administered 2011-11-21 – 2011-11-22 (×2): 1250 mg via INTRAVENOUS
  Filled 2011-11-21 (×2): qty 1250

## 2011-11-21 NOTE — Progress Notes (Signed)
Subjective: Dressing changed and wound looks much better. Awaiting Dr. Lattie Haw decision on route of Antibiotics.He is afebrile and WBC is Normal.   Objective: Vital signs in last 24 hours: Temp:  [97.8 F (36.6 C)-98.5 F (36.9 C)] 97.9 F (36.6 C) (11/12 0648) Pulse Rate:  [56-73] 56  (11/12 0648) Resp:  [14-18] 18  (11/12 0648) BP: (128-150)/(71-79) 128/71 mmHg (11/12 0648) SpO2:  [97 %-99 %] 98 % (11/12 0648)  Intake/Output from previous day: 11/11 0701 - 11/12 0700 In: 1904.2 [P.O.:1620; I.V.:284.2] Out: 3050 [Urine:3050] Intake/Output this shift:     Basename 11/21/11 0442 11/20/11 0440 11/19/11 0448  HGB 11.1* 10.9* 10.4*    Basename 11/21/11 0442 11/20/11 0440  WBC 5.5 5.3  RBC 4.02* 3.95*  HCT 34.7* 33.7*  PLT 318 301    Basename 11/20/11 0440  NA --  K --  CL --  CO2 --  BUN --  CREATININE 0.91  GLUCOSE --  CALCIUM --   No results found for this basename: LABPT:2,INR:2 in the last 72 hours  Compartment soft Wound looks very good.  Assessment/Plan: Doing well today. Awaiting ID   Neah Sporrer A 11/21/2011, 7:19 AM

## 2011-11-21 NOTE — Progress Notes (Signed)
Physical Therapy Treatment Patient Details Name: [name] MRN: [id number] DOB: [birth year] Today's Date: 11/21/2011 Time: 0865-7846 PT Time Calculation (min): 14 min  PT Assessment / Plan / Recommendation Comments on Treatment Session  Ambulated pt in hallway then practiced up/down one step with no rail and up/down 4 steps with one R rail.  Pt progressing well and plans to D/C to home tomorrow if cleared by MD.  No home health PT indicated.    Follow Up Recommendations  No PT follow up     Does the patient have the potential to tolerate intense rehabilitation     Barriers to Discharge        Equipment Recommendations  None recommended by PT    Recommendations for Other Services    Frequency  (one more PT session to address stairs)   Plan Discharge plan remains appropriate    Precautions / Restrictions Precautions Precautions: None Restrictions Weight Bearing Restrictions: No LLE Weight Bearing: Weight bearing as tolerated    Pertinent Vitals/Pain No c/o pain    Mobility  Bed Mobility Bed Mobility: Supine to Sit;Sitting - Scoot to Edge of Bed Supine to Sit: 6: Modified independent (Device/Increase time) Sitting - Scoot to Edge of Bed: 6: Modified independent (Device/Increase time) Details for Bed Mobility Assistance: only required inceased time   Transfers Transfers: Sit to Stand;Stand to Sit Sit to Stand: 6: Modified independent (Device/Increase time);From bed Stand to Sit: 6: Modified independent (Device/Increase time);To bed Details for Transfer Assistance: increased time   Ambulation/Gait Ambulation/Gait Assistance: 5: Supervision Ambulation Distance (Feet): 350 Feet Assistive device: Other (Comment) (holding IV pole) Ambulation/Gait Assistance Details: good alternating gait with no LOB Gait Pattern: Step-through pattern Gait velocity: decreased  Stairs: Yes Stairs Assistance: 4: Min guard Stair Management Technique: No rails;One rail Right Number  of Stairs: 5  (one step then 4 steps)    PT Goals                                                               progressing    Visit Information  Last PT Received On: 11/21/11 Assistance Needed: +1    Subjective Data      Cognition       Balance     End of Session PT - End of Session Equipment Utilized During Treatment: Gait belt Activity Tolerance: Patient tolerated treatment well Patient left: in bed;with call bell/phone within reach   [name]  PTA [facility]  Acute  Rehab Pager     [phone]

## 2011-11-21 NOTE — Op Note (Signed)
[name], [name] NO.:  [id number]  MEDICAL RECORD NO.:  [id number]  LOCATION:  1611                         FACILITY:  [facility]  PHYSICIAN:  Georges Lynch. Kenora Spayd, M.D.DATE OF BIRTH:  [birth year]  DATE OF PROCEDURE: DATE OF DISCHARGE:                              OPERATIVE REPORT   This patient was taken to surgery by me on Saturday which was November 18, 2011.  The preop surgeon was Dr. Darrelyn Hillock and assistant the OR nurse.  PREOPERATIVE DIAGNOSIS:  Subcutaneous abscess down to muscle involving the left thigh secondary to trauma.  OPERATION: 1. I first did a superficial debridement of the necrotic skin.  This     was superficial.  I did not have to do full thickness debridement     of the skin. 2. The size of the reamer was about 4 x 4 cm area of debridement     superficially. 3. I then did a open incision and drainage and debridement of the deep     abscess down to muscle.  PROCEDURE:  At this time, no tourniquet was necessary.  The patient had appropriate amount of antibiotics given to him IV.  The appropriate time- out was carried out prior to all the surgical incisions, and I marked the appropriate left leg in the holding area.  Now at this time after I debrided the superficial necrotic skin, I had cleaned skin underneath there.  I made a incision that measured about 7 cm in length, went down deep and debrided the subcutaneous tissue.  There were portions of infected subcutaneous tissue that I utilized a rongeur to remove.  Then the rest of it was excised with the scalpel.  I cauterized the bleeders as we came across the bleeders.  At this time after making this deep incision, there was clotted blood and purulent material that we suctioned out and thoroughly irrigated out with the pulse irrigation system.  At this time, I then went down and removed more subcutaneous tissue with a scalpel.  The incision as I mentioned went down to the muscle.  There was no  involvement of the joint.  The rongeur we used was used to just remove the superficial necrotic subcutaneous tissues we usually do in orthopedics.  I then went down and removed remaining part of the necrotic material with the scalpel and utilizing a pickup to do that as well.  We had the rake retractors in to hold the wound open during the procedure.  The tissue that we devitalized was mainly all subcutaneous tissue because the muscle was definitely intact.  We did not have to remove any muscle in this area.  After we cut out with a scalpel the necrotic tissue, we then thoroughly water picked the knee out with a Waterpik and then I irrigated again with the antibiotic solution.  As I mentioned, I was asked to give the depth of this incision and was down to bleeding muscle.  After this was done and I felt that the wound was clean, I then packed the wound open with an antibiotic soaked sponge.  Also at this time I would like to state that aerobic and anaerobic cultures were taken  immediately upon making the incision into the skin.  The cultures were sent as I mentioned.  I loosely applied some suture material just to approximate the wound, not to totally close the wound because we left it open with a drain.  Sterile dressings were applied and the patient left the operating room in satisfactory condition.          ______________________________ Georges Lynch Darrelyn Hillock, M.D.     RAG/MEDQ  D:  11/20/2011  T:  11/21/2011  Job:  [id number]

## 2011-11-21 NOTE — Progress Notes (Signed)
[facility]  Days of antibiotics: # 4 Days of vancomycin # 4 One dose of ancef   Subjective: No new complaints   Antibiotics:  Anti-infectives     Start     Dose/Rate Route Frequency Ordered Stop   11/19/11 1800   vancomycin (VANCOCIN) IVPB 1000 mg/200 mL premix  Status:  Discontinued     Comments: Pharmacy Protocol      1,000 mg 200 mL/hr over 60 Minutes Intravenous Every 12 hours 11/19/11 0940 11/19/11 0942   11/19/11 1800   vancomycin (VANCOCIN) IVPB 1000 mg/200 mL premix     Comments: Pharmacy Protocol      1,000 mg 200 mL/hr over 60 Minutes Intravenous Every 12 hours 11/19/11 0942     11/19/11 0600   vancomycin (VANCOCIN) IVPB 1000 mg/200 mL premix     Comments: Pharmacy Protocol      1,000 mg 200 mL/hr over 60 Minutes Intravenous Every 12 hours 11/18/11 1422 11/19/11 0611   11/18/11 1700   vancomycin (VANCOCIN) 2,000 mg in sodium chloride 0.9 % 500 mL IVPB        2,000 mg 250 mL/hr over 120 Minutes Intravenous  Once 11/18/11 1422 11/18/11 1952   11/18/11 1244   polymyxin B 500,000 Units, bacitracin 50,000 Units in sodium chloride irrigation 0.9 % 500 mL irrigation  Status:  Discontinued          As needed 11/18/11 1245 11/18/11 1315   11/18/11 0800   vancomycin (VANCOCIN) IVPB 1000 mg/200 mL premix  Status:  Discontinued        1,000 mg 200 mL/hr over 60 Minutes Intravenous Every 12 hours 11/17/11 1953 11/18/11 1433   11/17/11 1930   vancomycin (VANCOCIN) IVPB 1000 mg/200 mL premix  Status:  Discontinued        1,000 mg 200 mL/hr over 60 Minutes Intravenous  Once 11/17/11 1854 11/17/11 1854   11/17/11 1930   vancomycin (VANCOCIN) 2,000 mg in sodium chloride 0.9 % 500 mL IVPB        2,000 mg 250 mL/hr over 120 Minutes Intravenous  Once 11/17/11 1854 11/18/11 0011   11/17/11 1842   ceFAZolin (ANCEF) IVPB 2 g/50 mL premix        2 g 100 mL/hr over 30 Minutes Intravenous 60 min pre-op 11/17/11 1843 11/18/11 1226           Medications: Scheduled Meds:   . insulin aspart  0-15 Units Subcutaneous TID WC  . linagliptin  5 mg Oral Daily  . pioglitazone  45 mg Oral Q breakfast  . rivaroxaban  10 mg Oral Q breakfast  . vancomycin  1,000 mg Intravenous Q12H   Continuous Infusions:   . lactated ringers 10 mL/hr (11/20/11 1930)   PRN Meds:.acetaminophen, acetaminophen, ALPRAZolam, alum & mag hydroxide-simeth, bisacodyl, HYDROcodone-acetaminophen, HYDROmorphone (DILAUDID) injection, menthol-cetylpyridinium, methocarbamol (ROBAXIN) IV, methocarbamol, ondansetron (ZOFRAN) IV, ondansetron, oxyCODONE-acetaminophen, phenol, polyethylene glycol, polyvinyl alcohol   Objective: Weight change:   Intake/Output Summary (Last 24 hours) at 11/21/11 1256 Last data filed at 11/21/11 1224  Gross per 24 hour  Intake 2024.17 ml  Output   3150 ml  Net -1125.83 ml   Blood pressure 128/71, pulse 56, temperature 97.9 F (36.6 C), temperature source Oral, resp. rate 15, height 5' 10.87" (1.8 m), weight 227 lb (102.967 kg), SpO2 98.00%. Temp:  [97.8 F (36.6 C)-98.5 F (36.9 C)] 97.9 F (36.6 C) (11/12 0648) Pulse Rate:  [56-73] 56  (11/12 0648) Resp:  [14-18] 15  (  11/12 0800) BP: (128-150)/(71-79) 128/71 mmHg (11/12 0648) SpO2:  [97 %-98 %] 98 % (11/12 0648)  Physical Exam: General: Alert and awake, oriented x3, not in any acute distress.  HEENT: anicteric sclera,, oropharynx clear and without exudate  CVS regular rate, normal r, no murmur rubs or gallops  Chest: clear to auscultation bilaterally, no wheezing, rales or rhonchi  Abdomen: soft nontender, nondistended, normal bowel sounds,  Extremities: Left knee bandaged  Skin: no rashes  Neuro: nonfocal, strength and sensation intact  Lab Results:  Basename 11/21/11 0442 11/20/11 0440  WBC 5.5 5.3  HGB 11.1* 10.9*  HCT 34.7* 33.7*  PLT 318 301    BMET  Basename 11/20/11 0440  NA --  K --  CL --  CO2 --  GLUCOSE --  BUN --  CREATININE 0.91  CALCIUM  --    Micro Results: Recent Results (from the past 240 hour(s))  SURGICAL PCR SCREEN     Status: Normal   Collection Time   11/17/11 10:01 PM      Component Value Range Status Comment   MRSA, PCR NEGATIVE  NEGATIVE Final    Staphylococcus aureus NEGATIVE  NEGATIVE Final   WOUND CULTURE     Status: Normal   Collection Time   11/18/11 12:45 PM      Component Value Range Status Comment   Specimen Description ABSCESS LEFT THIGH   Final    Special Requests NONE   Final    Gram Stain     Final    Value: ABUNDANT WBC PRESENT, PREDOMINANTLY PMN     NO SQUAMOUS EPITHELIAL CELLS SEEN     MODERATE GRAM POSITIVE COCCI IN CLUSTERS   Culture     Final    Value: MODERATE METHICILLIN RESISTANT STAPHYLOCOCCUS AUREUS     Note: RIFAMPIN AND GENTAMICIN SHOULD NOT BE USED AS SINGLE DRUGS FOR TREATMENT OF STAPH INFECTIONS. This organism DOES NOT demonstrate inducible Clindamycin resistance in vitro. CRITICAL RESULT CALLED TO, READ BACK BY AND VERIFIED WITH: LINDA BRYSON      11/21/11 0843 BY [email or web address]   Report Status 11/21/2011 FINAL   Final    Organism ID, Bacteria METHICILLIN RESISTANT STAPHYLOCOCCUS AUREUS   Final   ANAEROBIC CULTURE     Status: Normal (Preliminary result)   Collection Time   11/18/11 12:45 PM      Component Value Range Status Comment   Specimen Description ABSCESS LEFT THIGH   Final    Special Requests NONE   Final    Gram Stain     Final    Value: MODERATE WBC PRESENT, PREDOMINANTLY PMN     NO SQUAMOUS EPITHELIAL CELLS SEEN     MODERATE GRAM POSITIVE COCCI IN CLUSTERS   Culture     Final    Value: NO ANAEROBES ISOLATED; CULTURE IN PROGRESS FOR 5 DAYS   Report Status PENDING   Incomplete     Studies/Results: No results found.    Assessment/Plan: KARLIS CREGG is a 66 y.o. malewho had prosthetic knee replacement 7 months ago now with MR Staphylococcus aureus abscess in thigh area near knee joint but not involving the knee joint.   #1 MR Staphylococcus aureus thigh  abscess: ESR was 82, CRP was 2.9 -- As long as the joint itself is not infected and we are not dealing with osteomyelitis or blood stream infection and thorought debridement was performed I do not see an absolute indication for continuing intravenous antibiotics. For convenience I would instead, place him on  oral doxycyline 100 mg bid for AT LEAST 2-3 weeks postoperatively with close followup with his Surgeon Dr. Darrelyn Hillock and with [place] in [facility]  #2 MRSA contact precautions  #3 Screening; HIV negative  I will arrange for HSFU wit our clinic for Mr Silversmith, please call with further questions.    LOS: 4 days   Acey Lav 11/21/2011, 12:56 PM

## 2011-11-21 NOTE — Progress Notes (Signed)
Physical Therapy Treatment Patient Details Name: [name] MRN: [id number] DOB: 1945-08-05 Today's Date: 11/21/2011 Time: 0950-1007 PT Time Calculation (min): 17 min  PT Assessment / Plan / Recommendation Comments on Treatment Session  Pt plans to D/c to home. Awaiting OICC line placement for IV antibiotics.     Follow Up Recommendations  No PT follow up     Does the patient have the potential to tolerate intense rehabilitation     Barriers to Discharge        Equipment Recommendations       Recommendations for Other Services    Frequency  (one more PT session to address stairs)   Plan Discharge plan remains appropriate    Precautions / Restrictions Precautions Precautions: None Restrictions Weight Bearing Restrictions: No    Pertinent Vitals/Pain C/o "soreness'    Mobility  Bed Mobility Bed Mobility: Supine to Sit;Sitting - Scoot to Edge of Bed Supine to Sit: 5: Supervision Sitting - Scoot to Edge of Bed: 5: Supervision Details for Bed Mobility Assistance: only required inceased time   Transfers Transfers: Sit to Stand;Stand to Sit Sit to Stand: 6: Modified independent (Device/Increase time);From bed Stand to Sit: 6: Modified independent (Device/Increase time);To chair/3-in-1 Details for Transfer Assistance: increased time  Ambulation/Gait Ambulation/Gait Assistance: 5: Supervision Ambulation Distance (Feet): 295 Feet Assistive device: Other (Comment) (pushing IV pole) Ambulation/Gait Assistance Details: pt feeling better with min pain. Gait Pattern: Step-through pattern Gait velocity: decreased     PT Goals                                                             progressing    Visit Information  Last PT Received On: 11/21/11 Assistance Needed: +1    Subjective Data      Cognition       Balance     End of Session PT - End of Session Equipment Utilized During Treatment: Gait belt Activity Tolerance: Patient tolerated treatment  well Patient left: in bed;with call bell/phone within reach   [name]  PTA [facility]  Acute  Rehab Pager     [phone]

## 2011-11-21 NOTE — Progress Notes (Addendum)
ANTIBIOTIC CONSULT NOTE - Follow UP  Pharmacy Consult for Vancomycin  Indication: Thigh Abscess  Allergies  Allergen Reactions  . Iohexol      Code: RASH, Onset Date: 16109604   . Penicillins Itching    PATIENT TOLERATED KEFLEX    Patient Measurements: Height: 5' 10.87" (180 cm) Weight: 227 lb (102.967 kg) (weighed pt at the hospital) IBW/kg (Calculated) : 74.99   Labs:  [facility] 11/21/11 0442 11/20/11 0440 11/19/11 0448  WBC 5.5 5.3 5.8  HGB 11.1* 10.9* 10.4*  PLT 318 301 271  LABCREA -- -- --  CREATININE -- 0.91 --   Estimated Creatinine Clearance: 96 ml/min (by C-G formula based on Cr of 0.91).  Basename 11/21/11 1534  VANCOTROUGH 14.4  VANCOPEAK --  VANCORANDOM --  GENTTROUGH --  GENTPEAK --  GENTRANDOM --  TOBRATROUGH --  TOBRAPEAK --  TOBRARND --  AMIKACINPEAK --  AMIKACINTROU --  AMIKACIN --     Assessment:  66 yo M with L thigh abscess, s/p I & D on 11/9.  Now on D#5 Vancomycin 1 gram IV q12h.  Culture from abscess growing MRSA .  Scr remains stable.  ID on board, mentioned that as long as the join is not infected and this is not osteomyelitis or bacteremia, there is no absolute indication for continuing IV abx.  Plan doxycycline 100mg  BID for at least 2-3 weeks postoperatively.   Vancomycin trough today near therapeutic at 14.4, however vancomycin trough was drawn 2 hours early.  Extrapolated trough ~12.2.  Although aware MD plans is to transition to PO antibiotic, will change vancomycin to ensure therapeutic while vanc still on board.   Goal of Therapy:  Vancomycin trough level 15-20 mcg/ml Eradication of infection  Plan:  1.  Increase Vancomycin to 1250 mg IV q12h 2.  Pharmacy will f/u  Geoffry Paradise, PharmD, BCPS Pager: [phone] 4:45 PM Pharmacy #: [id number]

## 2011-11-21 NOTE — Progress Notes (Signed)
CARE MANAGEMENT NOTE 11/21/2011  Patient:  Pixie Casino   Account Number:  [id number]  Date Initiated:  11/21/2011  Documentation initiated by:  Colleen Can  Subjective/Objective Assessment:   dx total hip replacemnt     Action/Plan:   CM spoke with patient. Plans are incomplete at this time but she wishes to  have her son speak with me. Cm attempted to call son wrong number. She requested that i need number to have son call me   Anticipated DC Date:  11/23/2011   Anticipated DC Plan:  HOME W HOME HEALTH SERVICES         Choice offered to / List presented to:             Status of service:  In process, will continue to follow Medicare Important Message given?   (If response is "NO", the following Medicare IM given date fields will be blank) D

## 2011-11-22 LAB — GLUCOSE, CAPILLARY: Glucose-Capillary: 102 mg/dL — ABNORMAL HIGH (ref 70–99)

## 2011-11-22 MED ORDER — RIVAROXABAN 10 MG PO TABS: 10.0000 mg | ORAL_TABLET | Freq: Every day | ORAL | Status: DC

## 2011-11-22 MED ORDER — POLYETHYLENE GLYCOL 3350 17 G PO PACK: 17.0000 g | PACK | Freq: Every day | ORAL | Status: DC | PRN

## 2011-11-22 MED ORDER — METHOCARBAMOL 500 MG PO TABS: 500.0000 mg | ORAL_TABLET | Freq: Four times a day (QID) | ORAL | Status: DC | PRN

## 2011-11-22 MED ORDER — OXYCODONE-ACETAMINOPHEN 5-325 MG PO TABS: 2.0000 | ORAL_TABLET | ORAL | Status: DC | PRN

## 2011-11-22 MED ORDER — DOXYCYCLINE HYCLATE 50 MG PO CAPS: 100.0000 mg | ORAL_CAPSULE | Freq: Two times a day (BID) | ORAL | Status: DC

## 2011-11-22 NOTE — Progress Notes (Signed)
11/22/2011 Damaris Schooner RN CCM [phone] Pt discharged today on oral abx. No HH services required.[facility] notified

## 2011-11-22 NOTE — Progress Notes (Signed)
   Subjective: 4 Days Post-Op Procedure(s) (LRB): IRRIGATION AND DEBRIDEMENT EXTREMITY (Left) Patient reports pain as mild.   Patient seen in rounds without Dr. Darrelyn Hillock. Patient is well, and has had no acute complaints or problems. He has some pain in the knee but overall says he feels much better. He denies shortness of breath and chest pain. No issues overnight. Plan is to go Home after hospital stay.  Objective: Vital signs in last 24 hours: Temp:  [97.3 F (36.3 C)-98.3 F (36.8 C)] 97.3 F (36.3 C) (11/13 0501) Pulse Rate:  [55-57] 55  (11/13 0501) Resp:  [14-19] 16  (11/13 0501) BP: (110-127)/(66-70) 110/70 mmHg (11/13 0501) SpO2:  [97 %-100 %] 97 % (11/13 0501)  Intake/Output from previous day:  Intake/Output Summary (Last 24 hours) at 11/22/11 0814 Last data filed at 11/22/11 0810  Gross per 24 hour  Intake   1449 ml  Output   2725 ml  Net  -1276 ml    Intake/Output this shift: Total I/O In: -  Out: 300 [Urine:300]  Labs:  [facility] 11/21/11 0442 11/20/11 0440  HGB 11.1* 10.9*    Basename 11/21/11 0442 11/20/11 0440  WBC 5.5 5.3  RBC 4.02* 3.95*  HCT 34.7* 33.7*  PLT 318 301    Basename 11/20/11 0440  NA --  K --  CL --  CO2 --  BUN --  CREATININE 0.91  GLUCOSE --  CALCIUM --    EXAM General - Patient is Alert and Oriented Extremity - Neurologically intact Neurovascular intact Dorsiflexion/Plantar flexion intact No cellulitis present Dressing/Incision - clean, dry, no drainage Motor Function - intact, moving foot and toes well on exam.   Past Medical History  Diagnosis Date  . Arthritis   . Diabetes mellitus   . Cancer     L EYE - MELANOMA    Assessment/Plan: 4 Days Post-Op Procedure(s) (LRB): IRRIGATION AND DEBRIDEMENT EXTREMITY (Left) Active Problems:  Abscess of left thigh  Estimated Body mass index is 31.78 kg/(m^2) as calculated from the following:   Height as of this encounter: 5' 10.866"(1.8 m).   Weight as of this  encounter: 227 lb(102.967 kg). Advance diet Up with therapy D/C IV fluids Discharge home with home health  DVT Prophylaxis - Xarelto Weight-Bearing as tolerated to left leg  Will follow up in office with Dr. Darrelyn Hillock on Friday or Saturday. Will send home on doxycycline 100BID for at least two weeks.   [name] LAUREN 11/22/2011, 8:14 AM

## 2011-11-23 LAB — ANAEROBIC CULTURE

## 2011-11-27 NOTE — Discharge Summary (Signed)
Physician Discharge Summary   Patient ID: [name] MRN: [id number] DOB/AGE: [id number] 67 y.o.  Admit date: 11/17/2011 Discharge date: 11/22/2011  Primary Diagnosis:  Abscess of left thigh  Admission Diagnoses:  Past Medical History  Diagnosis Date  . Arthritis   . Diabetes mellitus   . Cancer     L EYE - MELANOMA   Discharge Diagnoses:   Active Problems:  Abscess of left thigh  Estimated Body mass index is 31.78 kg/(m^2) as calculated from the following:   Height as of this encounter: 5' 10.866"(1.8 m).   Weight as of this encounter: 227 lb(102.967 kg).  Classification of overweight in adults according to BMI (WHO, 1998)   Procedure:  Procedure(s) (LRB): IRRIGATION AND DEBRIDEMENT EXTREMITY (Left)   Consults: ID  HPI: [name] is a 65 year old male who presented to Dr. Jeannetta Ellis office with a traumatic abscess over the left thigh. He reports that a couch struck his left thigh several weeks ago while he was trying to move it. He had a total knee arthroplasty on the left side 7 months ago.   Laboratory Data: Admission on 11/17/2011, Discharged on 11/22/2011  Component Date Value Range Status  . aPTT 11/17/2011 36  24 - 37 seconds Final  . Sodium 11/17/2011 133* 135 - 145 mEq/L Final  . Potassium 11/17/2011 3.8  3.5 - 5.1 mEq/L Final  . Chloride 11/17/2011 97  96 - 112 mEq/L Final  . CO2 11/17/2011 25  19 - 32 mEq/L Final  . Glucose, Bld 11/17/2011 168* 70 - 99 mg/dL Final  . BUN 11/91/4782 13  6 - 23 mg/dL Final  . Creatinine, Ser 11/17/2011 0.99  0.50 - 1.35 mg/dL Final  . Calcium 95/62/1308 9.5  8.4 - 10.5 mg/dL Final  . Total Protein 11/17/2011 7.3  6.0 - 8.3 g/dL Final  . Albumin 65/78/4696 3.3* 3.5 - 5.2 g/dL Final  . AST 29/52/8413 17  0 - 37 U/L Final  . ALT 11/17/2011 15  0 - 53 U/L Final  . Alkaline Phosphatase 11/17/2011 63  39 - 117 U/L Final  . Total Bilirubin 11/17/2011 0.7  0.3 - 1.2 mg/dL Final  . GFR calc non Af Amer 11/17/2011 83* >90  mL/min Final  . GFR calc Af Amer 11/17/2011 >90  >90 mL/min Final   Comment:                                 The eGFR has been calculated                          using the CKD EPI equation.                          This calculation has not been                          validated in all clinical                          situations.                          eGFR's persistently                          <  90 mL/min signify                          possible Chronic Kidney Disease.  Marland Kitchen Prothrombin Time 11/17/2011 14.2  11.6 - 15.2 seconds Final  . INR 11/17/2011 1.11  0.00 - 1.49 Final  . Color, Urine 11/17/2011 YELLOW  YELLOW Final  . APPearance 11/17/2011 CLEAR  CLEAR Final  . Specific Gravity, Urine 11/17/2011 1.022  1.005 - 1.030 Final  . pH 11/17/2011 6.5  5.0 - 8.0 Final  . Glucose, UA 11/17/2011 NEGATIVE  NEGATIVE mg/dL Final  . Hgb urine dipstick 11/17/2011 NEGATIVE  NEGATIVE Final  . Bilirubin Urine 11/17/2011 NEGATIVE  NEGATIVE Final  . Ketones, ur 11/17/2011 NEGATIVE  NEGATIVE mg/dL Final  . Protein, ur 16/10/9602 NEGATIVE  NEGATIVE mg/dL Final  . Urobilinogen, UA 11/17/2011 4.0* 0.0 - 1.0 mg/dL Final  . Nitrite 54/09/8117 NEGATIVE  NEGATIVE Final  . Leukocytes, UA 11/17/2011 NEGATIVE  NEGATIVE Final   MICROSCOPIC NOT DONE ON URINES WITH NEGATIVE PROTEIN, BLOOD, LEUKOCYTES, NITRITE, OR GLUCOSE <1000 mg/dL.  Marland Kitchen MRSA, PCR 11/17/2011 NEGATIVE  NEGATIVE Final  . Staphylococcus aureus 11/17/2011 NEGATIVE  NEGATIVE Final   Comment:                                 The Xpert SA Assay (FDA                          approved for NASAL specimens                          in patients over 34 years of age),                          is one component of                          a comprehensive surveillance                          program.  Test performance has                          been validated by [employer] for patients greater                          than or  equal to 34 year old.                          It is not intended                          to diagnose infection nor to                          guide or monitor treatment.  . Glucose-Capillary 11/17/2011 137* 70 - 99 mg/dL Final  . Glucose-Capillary 11/18/2011 122* 70 - 99 mg/dL Final  . Glucose-Capillary 11/18/2011 118* 70 - 99  mg/dL Final  . Specimen Description 11/18/2011 ABSCESS LEFT THIGH   Final  . Special Requests 11/18/2011 NONE   Final  . Gram Stain 11/18/2011    Final                   Value:ABUNDANT WBC PRESENT, PREDOMINANTLY PMN                         NO SQUAMOUS EPITHELIAL CELLS SEEN                         MODERATE GRAM POSITIVE COCCI IN CLUSTERS  . Culture 11/18/2011    Final                   Value:MODERATE METHICILLIN RESISTANT STAPHYLOCOCCUS AUREUS                         Note: RIFAMPIN AND GENTAMICIN SHOULD NOT BE USED AS SINGLE DRUGS FOR TREATMENT OF STAPH INFECTIONS. This organism DOES NOT demonstrate inducible Clindamycin resistance in vitro. CRITICAL RESULT CALLED TO, READ BACK BY AND VERIFIED WITH: LINDA BRYSON                          11/21/11 0843 BY SMITHERSJ  . Report Status 11/18/2011 11/21/2011 FINAL   Final  . Organism ID, Bacteria 11/18/2011 METHICILLIN RESISTANT STAPHYLOCOCCUS AUREUS   Final  . Specimen Description 11/18/2011 ABSCESS LEFT THIGH   Final  . Special Requests 11/18/2011 NONE   Final  . Gram Stain 11/18/2011    Final                   Value:MODERATE WBC PRESENT, PREDOMINANTLY PMN                         NO SQUAMOUS EPITHELIAL CELLS SEEN                         MODERATE GRAM POSITIVE COCCI IN CLUSTERS  . Culture 11/18/2011 NO ANAEROBES ISOLATED   Final  . Report Status 11/18/2011 11/23/2011 FINAL   Final  . Glucose-Capillary 11/18/2011 101* 70 - 99 mg/dL Final  . Comment 1 40/98/1191 Documented in Chart   Final  . Glucose-Capillary 11/18/2011 199* 70 - 99 mg/dL Final  . WBC 47/82/9562 5.8  4.0 - 10.5 K/uL Final  . RBC 11/19/2011 3.78* 4.22 -  5.81 MIL/uL Final  . Hemoglobin 11/19/2011 10.4* 13.0 - 17.0 g/dL Final  . HCT 13/08/6576 32.5* 39.0 - 52.0 % Final  . MCV 11/19/2011 86.0  78.0 - 100.0 fL Final  . MCH 11/19/2011 27.5  26.0 - 34.0 pg Final  . MCHC 11/19/2011 32.0  30.0 - 36.0 g/dL Final  . RDW 46/96/2952 15.6* 11.5 - 15.5 % Final  . Platelets 11/19/2011 271  150 - 400 K/uL Final  . Glucose-Capillary 11/18/2011 80  70 - 99 mg/dL Final  . Comment 1 84/13/2440 Documented in Chart   Final  . Comment 2 11/18/2011 Notify RN   Final  . Glucose-Capillary 11/19/2011 120* 70 - 99 mg/dL Final  . Comment 1 11/05/2534 Documented in Chart   Final  . Comment 2 11/19/2011 Notify RN   Final  . Glucose-Capillary 11/19/2011 116* 70 - 99 mg/dL Final  . Comment 1 64/40/3474 Documented in Chart   Final  .  Comment 2 11/19/2011 Notify RN   Final  . WBC 11/20/2011 5.3  4.0 - 10.5 K/uL Final  . RBC 11/20/2011 3.95* 4.22 - 5.81 MIL/uL Final  . Hemoglobin 11/20/2011 10.9* 13.0 - 17.0 g/dL Final  . HCT 16/10/9602 33.7* 39.0 - 52.0 % Final  . MCV 11/20/2011 85.3  78.0 - 100.0 fL Final  . MCH 11/20/2011 27.6  26.0 - 34.0 pg Final  . MCHC 11/20/2011 32.3  30.0 - 36.0 g/dL Final  . RDW 54/09/8117 15.3  11.5 - 15.5 % Final  . Platelets 11/20/2011 301  150 - 400 K/uL Final  . Glucose-Capillary 11/19/2011 136* 70 - 99 mg/dL Final  . Comment 1 14/78/2956 Documented in Chart   Final  . Comment 2 11/19/2011 Notify RN   Final  . Glucose-Capillary 11/19/2011 142* 70 - 99 mg/dL Final  . Comment 1 21/30/8657 Documented in Chart   Final  . Comment 2 11/19/2011 Notify RN   Final  . Glucose-Capillary 11/20/2011 129* 70 - 99 mg/dL Final  . Creatinine, Ser 11/20/2011 0.91  0.50 - 1.35 mg/dL Final  . GFR calc non Af Amer 11/20/2011 86* >90 mL/min Final  . GFR calc Af Amer 11/20/2011 >90  >90 mL/min Final   Comment:                                 The eGFR has been calculated                          using the CKD EPI equation.                          This  calculation has not been                          validated in all clinical                          situations.                          eGFR's persistently                          <90 mL/min signify                          possible Chronic Kidney Disease.  . WBC 11/21/2011 5.5  4.0 - 10.5 K/uL Final  . RBC 11/21/2011 4.02* 4.22 - 5.81 MIL/uL Final  . Hemoglobin 11/21/2011 11.1* 13.0 - 17.0 g/dL Final  . HCT 84/69/6295 34.7* 39.0 - 52.0 % Final  . MCV 11/21/2011 86.3  78.0 - 100.0 fL Final  . MCH 11/21/2011 27.6  26.0 - 34.0 pg Final  . MCHC 11/21/2011 32.0  30.0 - 36.0 g/dL Final  . RDW 28/41/3244 15.4  11.5 - 15.5 % Final  . Platelets 11/21/2011 318  150 - 400 K/uL Final  . Sed Rate 11/21/2011 82* 0 - 16 mm/hr Final  . CRP 11/21/2011 2.9* <0.60 mg/dL Final  . Glucose-Capillary 11/20/2011 128* 70 - 99 mg/dL Final  . Glucose-Capillary 11/20/2011 114* 70 - 99 mg/dL Final  . Glucose-Capillary 11/20/2011 108* 70 - 99 mg/dL Final  . HIV  11/21/2011 NON REACTIVE  NON REACTIVE Final  . Glucose-Capillary 11/21/2011 140* 70 - 99 mg/dL Final  . Vancomycin Tr 11/21/2011 14.4  10.0 - 20.0 ug/mL Final  . Glucose-Capillary 11/21/2011 112* 70 - 99 mg/dL Final  . Comment 1 16/10/9602 Notify RN   Final  . Glucose-Capillary 11/21/2011 129* 70 - 99 mg/dL Final  . Glucose-Capillary 11/21/2011 113* 70 - 99 mg/dL Final  . Glucose-Capillary 11/22/2011 102* 70 - 99 mg/dL Final  . Comment 1 54/09/8117 Notify RN   Final     X-Rays:Chest Portable 1 View  11/17/2011  *RADIOLOGY REPORT*  Clinical Data: Preoperative examination (the surgery)  PORTABLE CHEST - 1 VIEW  Comparison: 02/24/2008  Findings: Grossly unchanged borderline enlarged cardiac silhouette and mediastinal contours.  Grossly unchanged minimal bibasilar heterogeneous opacities favored to represent atelectasis or scar. No focal airspace opacities.  No definite pleural effusion or pneumothorax.  Unchanged bones.  IMPRESSION: No acute  cardiopulmonary disease.   Original Report Authenticated By: Tacey Ruiz, MD     EKG: Orders placed during the hospital encounter of 11/17/11  . EKG 12-LEAD  . EKG 12-LEAD  . EKG     Hospital Course: JERMAN TINNON is a 66 y.o. who was admitted to [facility]. They were brought to the operating room on 11/17/2011 - 11/18/2011 and underwent Procedure(s): IRRIGATION AND DEBRIDEMENT EXTREMITY.  Patient tolerated the procedure well and was later transferred to the recovery room and then to the orthopaedic floor for postoperative care.  They were given PO and IV analgesics for pain control following their surgery.  They were given 24 hours of postoperative antibiotics of  Anti-infectives     Start     Dose/Rate Route Frequency Ordered Stop   11/22/11 0000   doxycycline (VIBRAMYCIN) 50 MG capsule        100 mg Oral 2 times daily 11/22/11 0823     11/21/11 1800   vancomycin (VANCOCIN) 1,250 mg in sodium chloride 0.9 % 250 mL IVPB  Status:  Discontinued     Comments: Pharmacy Protocol      1,250 mg 166.7 mL/hr over 90 Minutes Intravenous Every 12 hours 11/21/11 1652 11/22/11 1346   11/19/11 1800   vancomycin (VANCOCIN) IVPB 1000 mg/200 mL premix  Status:  Discontinued     Comments: Pharmacy Protocol      1,000 mg 200 mL/hr over 60 Minutes Intravenous Every 12 hours 11/19/11 0940 11/19/11 0942   11/19/11 1800   vancomycin (VANCOCIN) IVPB 1000 mg/200 mL premix  Status:  Discontinued     Comments: Pharmacy Protocol      1,000 mg 200 mL/hr over 60 Minutes Intravenous Every 12 hours 11/19/11 0942 11/21/11 1652   11/19/11 0600   vancomycin (VANCOCIN) IVPB 1000 mg/200 mL premix     Comments: Pharmacy Protocol      1,000 mg 200 mL/hr over 60 Minutes Intravenous Every 12 hours 11/18/11 1422 11/19/11 0611   11/18/11 1700   vancomycin (VANCOCIN) 2,000 mg in sodium chloride 0.9 % 500 mL IVPB        2,000 mg 250 mL/hr over 120 Minutes Intravenous  Once 11/18/11 1422 11/18/11 1952    11/18/11 1244   polymyxin B 500,000 Units, bacitracin 50,000 Units in sodium chloride irrigation 0.9 % 500 mL irrigation  Status:  Discontinued          As needed 11/18/11 1245 11/18/11 1315   11/18/11 0800   vancomycin (VANCOCIN) IVPB 1000 mg/200 mL premix  Status:  Discontinued        1,000 mg 200 mL/hr over 60 Minutes Intravenous Every 12 hours 11/17/11 1953 11/18/11 1433   11/17/11 1930   vancomycin (VANCOCIN) IVPB 1000 mg/200 mL premix  Status:  Discontinued        1,000 mg 200 mL/hr over 60 Minutes Intravenous  Once 11/17/11 1854 11/17/11 1854   11/17/11 1930   vancomycin (VANCOCIN) 2,000 mg in sodium chloride 0.9 % 500 mL IVPB        2,000 mg 250 mL/hr over 120 Minutes Intravenous  Once 11/17/11 1854 11/18/11 0011   11/17/11 1842   ceFAZolin (ANCEF) IVPB 2 g/50 mL premix        2 g 100 mL/hr over 30 Minutes Intravenous 60 min pre-op 11/17/11 1843 11/18/11 1226         and started on DVT prophylaxis in the form of Xarelto.   PT and OT were ordered for total joint protocol.  Discharge planning consulted to help with postop disposition and equipment needs.  Patient had a good night on the evening of surgery and started to get up OOB with therapy on day one. Continued to work with therapy into day two.  Dressing was changed on day two and the incision was still open from the drain, with scant bloody drainage. The patient had progressed with therapy and meeting their goals.  Incision was healing well. ID recommended home PO antibiotics of doxycycline for 2-3 weeks. Patient was seen in rounds and was ready to go home.   Discharge Medications: Prior to Admission medications   Medication Sig Start Date End Date Taking? Authorizing Provider  ALPRAZolam Prudy Feeler) 1 MG tablet Take 1 mg by mouth 2 (two) times daily as needed. Anxiety    Yes Historical Provider, MD  methylcellulose (ARTIFICIAL TEARS) 1 % ophthalmic solution Place 1 drop into both eyes 2 (two) times daily as needed. Dry eyes    Yes Historical Provider, MD  pioglitazone (ACTOS) 45 MG tablet Take 45 mg by mouth daily with breakfast.   Yes Historical Provider, MD  sitaGLIPtin (JANUVIA) 100 MG tablet Take 100 mg by mouth daily with breakfast.   Yes Historical Provider, MD  doxycycline (VIBRAMYCIN) 50 MG capsule Take 2 capsules (100 mg total) by mouth 2 (two) times daily. 11/22/11   Jazmine Longshore Tamala Ser, PA  methocarbamol (ROBAXIN) 500 MG tablet Take 1 tablet (500 mg total) by mouth every 6 (six) hours as needed. 11/22/11   Jenissa Tyrell Tamala Ser, PA  oxyCODONE-acetaminophen (PERCOCET/ROXICET) 5-325 MG per tablet Take 2 tablets by mouth every 4 (four) hours as needed (Q4-6 hours PRN). 11/22/11   Valdez Brannan Tamala Ser, PA  polyethylene glycol (MIRALAX / GLYCOLAX) packet Take 17 g by mouth daily as needed. 11/22/11   Rishikesh Khachatryan Tamala Ser, PA  rivaroxaban (XARELTO) 10 MG TABS tablet Take 1 tablet (10 mg total) by mouth daily with breakfast. 11/22/11   Shaana Acocella Tamala Ser, PA    Diet: Diabetic diet Activity:WBAT Follow-up:in 5 days Disposition - Home Discharged Condition: good   Discharge Orders    Future Appointments: Provider: Department: Dept Phone: Center:   12/05/2011 2:00 PM Ginnie Smart, MD [facility] [phone] [facility]     Future Orders Please Complete By Expires   Diet Carb Modified      Call MD / Call 911      Comments:   If you experience chest pain or shortness of breath, CALL 911 and be transported to the hospital  emergency room.  If you develope a fever above 101 F, pus (white drainage) or increased drainage or redness at the wound, or calf pain, call your surgeon's office.   Constipation Prevention      Comments:   Drink plenty of fluids.  Prune juice may be helpful.  You may use a stool softener, such as Colace (over the counter) 100 mg twice a day.  Use MiraLax (over the counter) for constipation as needed.   Increase activity slowly as tolerated       Discharge instructions      Comments:   Weight bearing as instructed. Change your dressing daily. Sponge bath only Call if any temperatures greater than 101 or any wound complications: [phone] during the day and ask for Dr. Jeannetta Ellis nurse, Mackey Birchwood. Follow up in office on Friday or Saturday   Driving restrictions      Comments:   No driving for 2 weeks       Medication List     As of 11/27/2011  7:52 AM    STOP taking these medications         aspirin EC 81 MG tablet      fish oil-omega-3 fatty acids 1000 MG capsule      vitamin B-12 500 MCG tablet   Commonly known as: CYANOCOBALAMIN      TAKE these medications         ALPRAZolam 1 MG tablet   Commonly known as: XANAX   Take 1 mg by mouth 2 (two) times daily as needed. Anxiety        doxycycline 50 MG capsule   Commonly known as: VIBRAMYCIN   Take 2 capsules (100 mg total) by mouth 2 (two) times daily.      methocarbamol 500 MG tablet   Commonly known as: ROBAXIN   Take 1 tablet (500 mg total) by mouth every 6 (six) hours as needed.      methylcellulose 1 % ophthalmic solution   Commonly known as: ARTIFICIAL TEARS   Place 1 drop into both eyes 2 (two) times daily as needed. Dry eyes      oxyCODONE-acetaminophen 5-325 MG per tablet   Commonly known as: PERCOCET/ROXICET   Take 2 tablets by mouth every 4 (four) hours as needed (Q4-6 hours PRN).      pioglitazone 45 MG tablet   Commonly known as: ACTOS   Take 45 mg by mouth daily with breakfast.      polyethylene glycol packet   Commonly known as: MIRALAX / GLYCOLAX   Take 17 g by mouth daily as needed.      rivaroxaban 10 MG Tabs tablet   Commonly known as: XARELTO   Take 1 tablet (10 mg total) by mouth daily with breakfast.      sitaGLIPtin 100 MG tablet   Commonly known as: JANUVIA   Take 100 mg by mouth daily with breakfast.         Signed: Devanee Pomplun LAUREN 11/27/2011, 7:52 AM

## 2011-12-05 ENCOUNTER — Telehealth: Payer: Self-pay | Admitting: *Deleted

## 2011-12-05 ENCOUNTER — Inpatient Hospital Stay: Payer: Medicare Other | Admitting: Infectious Diseases

## 2011-12-05 NOTE — Telephone Encounter (Signed)
Called home number listed in Dem[employer], left a message on the answering machine for patient to reschedule today's no-show appointment if he still needs our services.  Then called number listed for patient's caretaker, Waymon Amato. His wife answered and took the message for [name] to call us if [name] needed to reschedule his hospital follow up appointment. [name]'s wife verbalized agreement, declined to take our call back number. Andree Coss

## 2013-03-11 ENCOUNTER — Encounter (INDEPENDENT_AMBULATORY_CARE_PROVIDER_SITE_OTHER): Payer: Self-pay | Admitting: *Deleted

## 2013-03-12 ENCOUNTER — Encounter (INDEPENDENT_AMBULATORY_CARE_PROVIDER_SITE_OTHER): Payer: Self-pay

## 2013-03-31 ENCOUNTER — Other Ambulatory Visit (INDEPENDENT_AMBULATORY_CARE_PROVIDER_SITE_OTHER): Payer: Self-pay | Admitting: *Deleted

## 2013-03-31 ENCOUNTER — Telehealth (INDEPENDENT_AMBULATORY_CARE_PROVIDER_SITE_OTHER): Payer: Self-pay | Admitting: *Deleted

## 2013-03-31 DIAGNOSIS — Z1211 Encounter for screening for malignant neoplasm of colon: Secondary | ICD-10-CM

## 2013-03-31 DIAGNOSIS — Z86010 Personal history of colonic polyps: Secondary | ICD-10-CM

## 2013-03-31 DIAGNOSIS — Z800 Family history of malignant neoplasm of digestive organs: Secondary | ICD-10-CM

## 2013-03-31 MED ORDER — PEG 3350-KCL-NA BICARB-NACL 420 G PO SOLR: 4000.0000 mL | Freq: Once | ORAL | Status: DC

## 2013-03-31 NOTE — Telephone Encounter (Signed)
Patient needs trilyte 

## 2013-04-08 ENCOUNTER — Telehealth (INDEPENDENT_AMBULATORY_CARE_PROVIDER_SITE_OTHER): Payer: Self-pay | Admitting: *Deleted

## 2013-04-08 NOTE — Telephone Encounter (Signed)
  Procedure: tcs  Reason/Indication:  Hx polyps, fam hx colon ca  Has patient had this procedure before?  Yes, 2010 -- scanned  If so, when, by whom and where?    Is there a family history of colon cancer?  Yes, brother ??  Who?  What age when diagnosed?    Is patient diabetic?   yes      Does patient have prosthetic heart valve?  no  Do you have a pacemaker?  no  Has patient ever had endocarditis? no  Has patient had joint replacement within last 12 months?  no  Does patient tend to be constipated or take laxatives? Yes   Is patient on Coumadin, Plavix and/or Aspirin? yes  Medications: asa 81 mg daily, hydroco/apap 10/325 mg 1 tab po every 4 hrs prn for pain, pidglitazone 45 mg daily, januvia 100 mg daily (am), alprazolam 1 mg tid  Allergies: ivp dye, pcn  Medication Adjustment: asa 2 days  Procedure date & time: 05/08/13

## 2013-04-08 NOTE — Telephone Encounter (Signed)
agree

## 2013-05-01 ENCOUNTER — Encounter (HOSPITAL_COMMUNITY): Payer: Self-pay | Admitting: Pharmacy Technician

## 2013-06-25 ENCOUNTER — Encounter (INDEPENDENT_AMBULATORY_CARE_PROVIDER_SITE_OTHER): Payer: Self-pay | Admitting: *Deleted

## 2013-06-27 ENCOUNTER — Telehealth (INDEPENDENT_AMBULATORY_CARE_PROVIDER_SITE_OTHER): Payer: Self-pay | Admitting: *Deleted

## 2013-06-27 NOTE — Telephone Encounter (Signed)
  Procedure: tcs  Reason/Indication:  Hx polyps, fam hx colon ca  Has patient had this procedure before?  Yes, 2010 -- scanned  If so, when, by whom and where?    Is there a family history of colon cancer?  Yes, brother  Who?  What age when diagnosed?    Is patient diabetic?   yes      Does patient have prosthetic heart valve?  no  Do you have a pacemaker?  no  Has patient ever had endocarditis? no  Has patient had joint replacement within last 12 months?  no  Does patient tend to be constipated or take laxatives? ocassionally  Is patient on Coumadin, Plavix and/or Aspirin? yes  Medications: asa 81 mg daily, hydroco/apap 10/325 mg 1 tab every 4 hours prn, pidglitazone 45 mg daily, januvia 100 mg daily (am), alprazolam 1 mg tid  Allergies: ivp dye, pcn  Medication Adjustment: asa 2 days  Procedure date & time: 07/23/13 at 930

## 2013-06-30 NOTE — Telephone Encounter (Signed)
agree

## 2013-07-23 ENCOUNTER — Encounter (HOSPITAL_COMMUNITY): Admission: RE | Payer: Self-pay | Source: Ambulatory Visit

## 2013-07-23 ENCOUNTER — Ambulatory Visit (HOSPITAL_COMMUNITY): Admission: RE | Admit: 2013-07-23 | Payer: Medicare Other | Source: Ambulatory Visit | Admitting: Internal Medicine

## 2013-07-23 SURGERY — COLONOSCOPY
Anesthesia: Moderate Sedation

## 2013-08-27 ENCOUNTER — Other Ambulatory Visit: Payer: Self-pay | Admitting: Neurosurgery

## 2013-09-08 ENCOUNTER — Encounter (HOSPITAL_COMMUNITY)
Admission: RE | Admit: 2013-09-08 | Discharge: 2013-09-08 | Disposition: A | Discharge status: Home / Self Care | Payer: Medicare Other | Source: Ambulatory Visit | Attending: Neurosurgery | Admitting: Neurosurgery

## 2013-09-08 ENCOUNTER — Encounter (HOSPITAL_COMMUNITY): Payer: Self-pay

## 2013-09-08 ENCOUNTER — Ambulatory Visit (HOSPITAL_COMMUNITY)
Admission: RE | Admit: 2013-09-08 | Discharge: 2013-09-08 | Disposition: A | Discharge status: Home / Self Care | Payer: Medicare Other | Source: Ambulatory Visit | Attending: Anesthesiology | Admitting: Anesthesiology

## 2013-09-08 ENCOUNTER — Encounter (HOSPITAL_COMMUNITY): Payer: Self-pay | Admitting: Pharmacy Technician

## 2013-09-08 DIAGNOSIS — Z01818 Encounter for other preprocedural examination: Secondary | ICD-10-CM | POA: Diagnosis not present

## 2013-09-08 HISTORY — DX: Gastro-esophageal reflux disease without esophagitis: K21.9

## 2013-09-08 LAB — CBC
HCT: 36.8 % — ABNORMAL LOW (ref 39.0–52.0)
Hemoglobin: 12 g/dL — ABNORMAL LOW (ref 13.0–17.0)
MCH: 28.6 pg (ref 26.0–34.0)
MCHC: 32.6 g/dL (ref 30.0–36.0)
MCV: 87.6 fL (ref 78.0–100.0)
PLATELETS: 209 10*3/uL (ref 150–400)
RBC: 4.2 MIL/uL — ABNORMAL LOW (ref 4.22–5.81)
RDW: 15.7 % — AB (ref 11.5–15.5)
WBC: 6.5 10*3/uL (ref 4.0–10.5)

## 2013-09-08 LAB — BASIC METABOLIC PANEL
Anion gap: 10 (ref 5–15)
BUN: 12 mg/dL (ref 6–23)
CO2: 25 mEq/L (ref 19–32)
CREATININE: 1 mg/dL (ref 0.50–1.35)
Calcium: 9.5 mg/dL (ref 8.4–10.5)
Chloride: 107 mEq/L (ref 96–112)
GFR calc Af Amer: 87 mL/min — ABNORMAL LOW (ref >90)
GFR, EST NON AFRICAN AMERICAN: 75 mL/min — AB (ref >90)
Glucose, Bld: 107 mg/dL — ABNORMAL HIGH (ref 70–99)
Potassium: 4.9 mEq/L (ref 3.7–5.3)
SODIUM: 142 meq/L (ref 137–147)

## 2013-09-08 LAB — SURGICAL PCR SCREEN
MRSA, PCR: NEGATIVE
STAPHYLOCOCCUS AUREUS: POSITIVE — AB

## 2013-09-08 NOTE — Progress Notes (Signed)
09/08/13 1227  OBSTRUCTIVE SLEEP APNEA  Have you ever been diagnosed with sleep apnea through a sleep study? No  Do you snore loudly (loud enough to be heard through closed doors)?  0  Do you often feel tired, fatigued, or sleepy during the daytime? 0  Has anyone observed you stop breathing during your sleep? 0  Do you have, or are you being treated for high blood pressure? 0  BMI more than 35 kg/m2? 1  Age over 69 years old? 1  Neck circumference greater than 40 cm/16 inches? 1  Gender: 1  Obstructive Sleep Apnea Score 4  Score 4 or greater  Results sent to PCP

## 2013-09-08 NOTE — Progress Notes (Signed)
prescription called into [facility] in [place]

## 2013-09-08 NOTE — Progress Notes (Addendum)
Primary - [name] No cardiologist Heart cath 10 years ago echo 2010   Patient cannot read or write. Patient did not have anyone with him at PAT. Went over consent form in detail, all instructions for surgery in detail. Patient verbalized understanding of everything.

## 2013-09-08 NOTE — Pre-Procedure Instructions (Signed)
[name]  09/08/2013   Your procedure is scheduled on:  Thursday, September 3rd  Report to [facility] Admitting at [phone].  Call this number if you have problems the morning of surgery: [phone]   Remember:   Do not eat food or drink liquids after midnight.   Take these medicines the morning of surgery with A SIP OF WATER: eye drops, xanax if needed, percocet if needed  Do NOT take any diabetes medication on morning of surgery.   Do not wear jewelry.  Do not wear lotions, powders, or perfumes. You may wear deodorant.  Do not shave 48 hours prior to surgery. Men may shave face and neck.  Do not bring valuables to the hospital.  [facility] is not responsible  for any belongings or valuables.               Contacts, dentures or bridgework may not be worn into surgery.  Leave suitcase in the car. After surgery it may be brought to your room.  For patients admitted to the hospital, discharge time is determined by your  treatment team.               Patients discharged the day of surgery will not be allowed to drive home.  Please read over the following fact sheets that you were given: Pain Booklet, Coughing and Deep Breathing, MRSA Information and Surgical Site Infection Prevention [facility] - Preparing for Surgery  Before surgery, you can play an important role.  Because skin is not sterile, your skin needs to be as free of germs as possible.  You can reduce the number of germs on you skin by washing with CHG (chlorahexidine gluconate) soap before surgery.  CHG is an antiseptic cleaner which kills germs and bonds with the skin to continue killing germs even after washing.  Please DO NOT use if you have an allergy to CHG or antibacterial soaps.  If your skin becomes reddened/irritated stop using the CHG and inform your nurse when you arrive at Short Stay.  Do not shave (including legs and underarms) for at least 48 hours prior to the first CHG shower.  You may  shave your face.  Please follow these instructions carefully:   1.  Shower with CHG Soap the night before surgery and the morning of Surgery.  2.  If you choose to wash your hair, wash your hair first as usual with your normal shampoo.  3.  After you shampoo, rinse your hair and body thoroughly to remove the shampoo.  4.  Use CHG as you would any other liquid soap.  You can apply CHG directly to the skin and wash gently with scrungie or a clean washcloth.  5.  Apply the CHG Soap to your body ONLY FROM THE NECK DOWN.  Do not use on open wounds or open sores.  Avoid contact with your eyes, ears, mouth and genitals (private parts).  Wash genitals (private parts) with your normal soap.  6.  Wash thoroughly, paying special attention to the area where your surgery will be performed.  7.  Thoroughly rinse your body with warm water from the neck down.  8.  DO NOT shower/wash with your normal soap after using and rinsing off the CHG Soap.  9.  Pat yourself dry with a clean towel.            10.  Wear clean pajamas.  11.  Place clean sheets on your bed the night of your first shower and do not sleep with pets.  Day of Surgery  Do not apply any lotions/deoderants the morning of surgery.  Please wear clean clothes to the hospital/surgery center.

## 2013-09-10 MED ORDER — SODIUM CHLORIDE 0.9 % IV SOLN
1500.0000 mg | INTRAVENOUS | Status: AC
  Administered 2013-09-11: 1500 mg via INTRAVENOUS
  Filled 2013-09-10: qty 1500

## 2013-09-11 ENCOUNTER — Ambulatory Visit (HOSPITAL_COMMUNITY): Payer: Medicare Other

## 2013-09-11 ENCOUNTER — Encounter (HOSPITAL_COMMUNITY)
Admission: RE | Disposition: A | Discharge status: Home / Self Care | Payer: Self-pay | Source: Ambulatory Visit | Attending: Neurosurgery

## 2013-09-11 ENCOUNTER — Encounter (HOSPITAL_COMMUNITY): Payer: Medicare Other | Admitting: Anesthesiology

## 2013-09-11 ENCOUNTER — Ambulatory Visit (HOSPITAL_COMMUNITY)
Admission: RE | Admit: 2013-09-11 | Discharge: 2013-09-12 | Disposition: A | Discharge status: Home / Self Care | Payer: Medicare Other | Source: Ambulatory Visit | Attending: Neurosurgery | Admitting: Neurosurgery

## 2013-09-11 ENCOUNTER — Ambulatory Visit (HOSPITAL_COMMUNITY): Payer: Medicare Other | Admitting: Anesthesiology

## 2013-09-11 ENCOUNTER — Encounter (HOSPITAL_COMMUNITY): Payer: Self-pay | Admitting: *Deleted

## 2013-09-11 DIAGNOSIS — Z85820 Personal history of malignant melanoma of skin: Secondary | ICD-10-CM | POA: Insufficient documentation

## 2013-09-11 DIAGNOSIS — M5137 Other intervertebral disc degeneration, lumbosacral region: Secondary | ICD-10-CM | POA: Insufficient documentation

## 2013-09-11 DIAGNOSIS — M51379 Other intervertebral disc degeneration, lumbosacral region without mention of lumbar back pain or lower extremity pain: Secondary | ICD-10-CM | POA: Insufficient documentation

## 2013-09-11 DIAGNOSIS — M5126 Other intervertebral disc displacement, lumbar region: Secondary | ICD-10-CM | POA: Diagnosis present

## 2013-09-11 DIAGNOSIS — E119 Type 2 diabetes mellitus without complications: Secondary | ICD-10-CM | POA: Insufficient documentation

## 2013-09-11 DIAGNOSIS — M47817 Spondylosis without myelopathy or radiculopathy, lumbosacral region: Secondary | ICD-10-CM | POA: Diagnosis not present

## 2013-09-11 HISTORY — PX: LUMBAR LAMINECTOMY/DECOMPRESSION MICRODISCECTOMY: SHX5026

## 2013-09-11 LAB — GLUCOSE, CAPILLARY
Glucose-Capillary: 111 mg/dL — ABNORMAL HIGH (ref 70–99)
Glucose-Capillary: 113 mg/dL — ABNORMAL HIGH (ref 70–99)
Glucose-Capillary: 156 mg/dL — ABNORMAL HIGH (ref 70–99)

## 2013-09-11 SURGERY — LUMBAR LAMINECTOMY/DECOMPRESSION MICRODISCECTOMY 1 LEVEL
Anesthesia: General | Laterality: Left

## 2013-09-11 MED ORDER — GLYCOPYRROLATE 0.2 MG/ML IJ SOLN
INTRAMUSCULAR | Status: DC | PRN
  Administered 2013-09-11: 1 mg via INTRAVENOUS

## 2013-09-11 MED ORDER — ROCURONIUM BROMIDE 100 MG/10ML IV SOLN
INTRAVENOUS | Status: DC | PRN
  Administered 2013-09-11: 50 mg via INTRAVENOUS

## 2013-09-11 MED ORDER — LIDOCAINE HCL 4 % MT SOLN
OROMUCOSAL | Status: DC | PRN
  Administered 2013-09-11: 2 mL via TOPICAL

## 2013-09-11 MED ORDER — ONDANSETRON HCL 4 MG/2ML IJ SOLN
4.0000 mg | INTRAMUSCULAR | Status: DC | PRN
  Filled 2013-09-11: qty 2

## 2013-09-11 MED ORDER — SODIUM CHLORIDE 0.9 % IJ SOLN: 3.0000 mL | INTRAMUSCULAR | Status: DC | PRN

## 2013-09-11 MED ORDER — POLYETHYLENE GLYCOL 3350 17 G PO PACK
17.0000 g | PACK | Freq: Every day | ORAL | Status: DC | PRN
Start: 2013-09-11 — End: 2013-09-12
  Filled 2013-09-11: qty 1

## 2013-09-11 MED ORDER — PROPOFOL 10 MG/ML IV BOLUS
INTRAVENOUS | Status: DC | PRN
  Administered 2013-09-11: 200 mg via INTRAVENOUS

## 2013-09-11 MED ORDER — SODIUM CHLORIDE 0.9 % IJ SOLN: 3.0000 mL | Freq: Two times a day (BID) | INTRAMUSCULAR | Status: DC

## 2013-09-11 MED ORDER — FLEET ENEMA 7-19 GM/118ML RE ENEM
1.0000 | ENEMA | Freq: Once | RECTAL | Status: AC | PRN
  Filled 2013-09-11: qty 1

## 2013-09-11 MED ORDER — PIOGLITAZONE HCL 45 MG PO TABS
45.0000 mg | ORAL_TABLET | Freq: Every day | ORAL | Status: DC
  Administered 2013-09-12: 45 mg via ORAL
  Filled 2013-09-11 (×2): qty 1

## 2013-09-11 MED ORDER — ONDANSETRON HCL 4 MG/2ML IJ SOLN
INTRAMUSCULAR | Status: AC
  Filled 2013-09-11: qty 2

## 2013-09-11 MED ORDER — DOCUSATE SODIUM 100 MG PO CAPS
100.0000 mg | ORAL_CAPSULE | Freq: Two times a day (BID) | ORAL | Status: DC
  Administered 2013-09-11 – 2013-09-12 (×2): 100 mg via ORAL
  Filled 2013-09-11 (×3): qty 1

## 2013-09-11 MED ORDER — METHOCARBAMOL 500 MG PO TABS
ORAL_TABLET | ORAL | Status: AC
  Filled 2013-09-11: qty 1

## 2013-09-11 MED ORDER — INSULIN ASPART 100 UNIT/ML ~~LOC~~ SOLN
0.0000 [IU] | Freq: Three times a day (TID) | SUBCUTANEOUS | Status: DC
  Administered 2013-09-12: 2 [IU] via SUBCUTANEOUS

## 2013-09-11 MED ORDER — FENTANYL CITRATE 0.05 MG/ML IJ SOLN
INTRAMUSCULAR | Status: AC
  Filled 2013-09-11: qty 5

## 2013-09-11 MED ORDER — METHYLPREDNISOLONE ACETATE 80 MG/ML IJ SUSP
INTRAMUSCULAR | Status: DC | PRN
  Administered 2013-09-11: 80 mg via INTRALESIONAL

## 2013-09-11 MED ORDER — LACTATED RINGERS IV SOLN
INTRAVENOUS | Status: DC
  Administered 2013-09-11: 50 mL/h via INTRAVENOUS

## 2013-09-11 MED ORDER — HYDROCODONE-ACETAMINOPHEN 5-325 MG PO TABS: 1.0000 | ORAL_TABLET | ORAL | Status: DC | PRN

## 2013-09-11 MED ORDER — ALPRAZOLAM 0.5 MG PO TABS: 1.0000 mg | ORAL_TABLET | Freq: Two times a day (BID) | ORAL | Status: DC | PRN

## 2013-09-11 MED ORDER — METHOCARBAMOL 1000 MG/10ML IJ SOLN
500.0000 mg | Freq: Four times a day (QID) | INTRAMUSCULAR | Status: DC | PRN
  Filled 2013-09-11: qty 5

## 2013-09-11 MED ORDER — MORPHINE SULFATE 2 MG/ML IJ SOLN: 1.0000 mg | INTRAMUSCULAR | Status: DC | PRN

## 2013-09-11 MED ORDER — KCL IN DEXTROSE-NACL 20-5-0.45 MEQ/L-%-% IV SOLN
INTRAVENOUS | Status: DC
  Filled 2013-09-11 (×3): qty 1000

## 2013-09-11 MED ORDER — THROMBIN 5000 UNITS EX SOLR
CUTANEOUS | Status: DC | PRN
  Administered 2013-09-11 (×2): 5000 [IU] via TOPICAL

## 2013-09-11 MED ORDER — INSULIN ASPART 100 UNIT/ML ~~LOC~~ SOLN: 0.0000 [IU] | Freq: Every day | SUBCUTANEOUS | Status: DC

## 2013-09-11 MED ORDER — HYDROMORPHONE HCL PF 1 MG/ML IJ SOLN
INTRAMUSCULAR | Status: AC
  Administered 2013-09-11: 0.5 mg via INTRAVENOUS
  Filled 2013-09-11: qty 1

## 2013-09-11 MED ORDER — ASPIRIN EC 81 MG PO TBEC
81.0000 mg | DELAYED_RELEASE_TABLET | Freq: Every day | ORAL | Status: DC
  Administered 2013-09-12: 81 mg via ORAL
  Filled 2013-09-11: qty 1

## 2013-09-11 MED ORDER — 0.9 % SODIUM CHLORIDE (POUR BTL) OPTIME
TOPICAL | Status: DC | PRN
  Administered 2013-09-11: 1000 mL

## 2013-09-11 MED ORDER — ZOLPIDEM TARTRATE 5 MG PO TABS: 5.0000 mg | ORAL_TABLET | Freq: Every evening | ORAL | Status: DC | PRN

## 2013-09-11 MED ORDER — ALUM & MAG HYDROXIDE-SIMETH 200-200-20 MG/5ML PO SUSP: 30.0000 mL | Freq: Four times a day (QID) | ORAL | Status: DC | PRN

## 2013-09-11 MED ORDER — SODIUM CHLORIDE 0.9 % IV SOLN: 250.0000 mL | INTRAVENOUS | Status: DC

## 2013-09-11 MED ORDER — VANCOMYCIN HCL IN DEXTROSE 1-5 GM/200ML-% IV SOLN
1000.0000 mg | Freq: Once | INTRAVENOUS | Status: AC
  Administered 2013-09-12: 1000 mg via INTRAVENOUS
  Filled 2013-09-11: qty 200

## 2013-09-11 MED ORDER — OXYCODONE-ACETAMINOPHEN 5-325 MG PO TABS
1.0000 | ORAL_TABLET | ORAL | Status: DC | PRN
  Administered 2013-09-11 – 2013-09-12 (×4): 2 via ORAL
  Filled 2013-09-11 (×3): qty 2

## 2013-09-11 MED ORDER — LINAGLIPTIN 5 MG PO TABS
5.0000 mg | ORAL_TABLET | Freq: Every day | ORAL | Status: DC
  Administered 2013-09-12: 5 mg via ORAL
  Filled 2013-09-11: qty 1

## 2013-09-11 MED ORDER — MELOXICAM 15 MG PO TABS
15.0000 mg | ORAL_TABLET | Freq: Every day | ORAL | Status: DC
  Administered 2013-09-11 – 2013-09-12 (×2): 15 mg via ORAL
  Filled 2013-09-11 (×2): qty 1

## 2013-09-11 MED ORDER — MUPIROCIN 2 % EX OINT
TOPICAL_OINTMENT | Freq: Two times a day (BID) | CUTANEOUS | Status: DC
  Administered 2013-09-11: 22:00:00 via NASAL

## 2013-09-11 MED ORDER — SENNA 8.6 MG PO TABS
1.0000 | ORAL_TABLET | Freq: Two times a day (BID) | ORAL | Status: DC
  Administered 2013-09-11 – 2013-09-12 (×2): 8.6 mg via ORAL
  Filled 2013-09-11 (×3): qty 1

## 2013-09-11 MED ORDER — ACETAMINOPHEN 325 MG PO TABS: 650.0000 mg | ORAL_TABLET | ORAL | Status: DC | PRN

## 2013-09-11 MED ORDER — ONDANSETRON HCL 4 MG/2ML IJ SOLN
INTRAMUSCULAR | Status: DC | PRN
  Administered 2013-09-11: 4 mg via INTRAVENOUS

## 2013-09-11 MED ORDER — MENTHOL 3 MG MT LOZG: 1.0000 | LOZENGE | OROMUCOSAL | Status: DC | PRN

## 2013-09-11 MED ORDER — HYDROMORPHONE HCL PF 1 MG/ML IJ SOLN
0.2500 mg | INTRAMUSCULAR | Status: DC | PRN
  Administered 2013-09-11 (×2): 0.5 mg via INTRAVENOUS

## 2013-09-11 MED ORDER — ACETAMINOPHEN 650 MG RE SUPP: 650.0000 mg | RECTAL | Status: DC | PRN

## 2013-09-11 MED ORDER — METHOCARBAMOL 500 MG PO TABS
500.0000 mg | ORAL_TABLET | Freq: Four times a day (QID) | ORAL | Status: DC | PRN
  Administered 2013-09-11 – 2013-09-12 (×3): 500 mg via ORAL
  Filled 2013-09-11 (×3): qty 1

## 2013-09-11 MED ORDER — HYDROCODONE-ACETAMINOPHEN 10-325 MG PO TABS: 1.0000 | ORAL_TABLET | ORAL | Status: DC | PRN

## 2013-09-11 MED ORDER — PHENOL 1.4 % MT LIQD: 1.0000 | OROMUCOSAL | Status: DC | PRN

## 2013-09-11 MED ORDER — PROPOFOL 10 MG/ML IV BOLUS
INTRAVENOUS | Status: AC
  Filled 2013-09-11: qty 20

## 2013-09-11 MED ORDER — HEMOSTATIC AGENTS (NO CHARGE) OPTIME
TOPICAL | Status: DC | PRN
  Administered 2013-09-11: 1 via TOPICAL

## 2013-09-11 MED ORDER — BISACODYL 10 MG RE SUPP: 10.0000 mg | Freq: Every day | RECTAL | Status: DC | PRN

## 2013-09-11 MED ORDER — LIDOCAINE HCL (CARDIAC) 20 MG/ML IV SOLN
INTRAVENOUS | Status: DC | PRN
  Administered 2013-09-11: 100 mg via INTRAVENOUS

## 2013-09-11 MED ORDER — FENTANYL CITRATE 0.05 MG/ML IJ SOLN
INTRAMUSCULAR | Status: DC | PRN
  Administered 2013-09-11 (×2): 50 ug via INTRAVENOUS
  Administered 2013-09-11: 150 ug via INTRAVENOUS

## 2013-09-11 MED ORDER — BRIMONIDINE TARTRATE 0.2 % OP SOLN
1.0000 [drp] | Freq: Two times a day (BID) | OPHTHALMIC | Status: DC
  Administered 2013-09-11 – 2013-09-12 (×2): 1 [drp] via OPHTHALMIC
  Filled 2013-09-11: qty 5

## 2013-09-11 MED ORDER — LACTATED RINGERS IV SOLN
INTRAVENOUS | Status: DC | PRN
  Administered 2013-09-11 (×2): via INTRAVENOUS

## 2013-09-11 MED ORDER — FAMOTIDINE 20 MG PO TABS
20.0000 mg | ORAL_TABLET | Freq: Every day | ORAL | Status: DC
  Administered 2013-09-11 – 2013-09-12 (×2): 20 mg via ORAL
  Filled 2013-09-11 (×2): qty 1

## 2013-09-11 MED ORDER — BUPIVACAINE HCL (PF) 0.5 % IJ SOLN
INTRAMUSCULAR | Status: DC | PRN
  Administered 2013-09-11: 5 mL

## 2013-09-11 MED ORDER — ONDANSETRON HCL 4 MG/2ML IJ SOLN: 4.0000 mg | Freq: Once | INTRAMUSCULAR | Status: DC | PRN

## 2013-09-11 MED ORDER — ROCURONIUM BROMIDE 50 MG/5ML IV SOLN
INTRAVENOUS | Status: AC
  Filled 2013-09-11: qty 1

## 2013-09-11 MED ORDER — OXYCODONE-ACETAMINOPHEN 5-325 MG PO TABS
ORAL_TABLET | ORAL | Status: AC
  Filled 2013-09-11: qty 2

## 2013-09-11 MED ORDER — NEOSTIGMINE METHYLSULFATE 10 MG/10ML IV SOLN
INTRAVENOUS | Status: DC | PRN
  Administered 2013-09-11: 5 mg via INTRAVENOUS

## 2013-09-11 MED ORDER — LIDOCAINE HCL (CARDIAC) 20 MG/ML IV SOLN
INTRAVENOUS | Status: AC
  Filled 2013-09-11: qty 5

## 2013-09-11 MED ORDER — LIDOCAINE-EPINEPHRINE 1 %-1:100000 IJ SOLN
INTRAMUSCULAR | Status: DC | PRN
  Administered 2013-09-11: 5 mL via INTRADERMAL

## 2013-09-11 MED ORDER — FENTANYL CITRATE 0.05 MG/ML IJ SOLN
INTRAMUSCULAR | Status: AC
  Filled 2013-09-11: qty 2

## 2013-09-11 MED ORDER — FENTANYL CITRATE 0.05 MG/ML IJ SOLN
INTRAMUSCULAR | Status: DC | PRN
  Administered 2013-09-11: 50 ug via INTRAVENOUS

## 2013-09-11 SURGICAL SUPPLY — 59 items
BAG DECANTER FOR FLEXI CONT (MISCELLANEOUS) ×2 IMPLANT
BENZOIN TINCTURE PRP APPL 2/3 (GAUZE/BANDAGES/DRESSINGS) IMPLANT
BIT DRILL NEURO 2X3.1 SFT TUCH (MISCELLANEOUS) ×1 IMPLANT
BLADE SURG ROTATE 9660 (MISCELLANEOUS) IMPLANT
BUR ROUND FLUTED 5 RND (BURR) ×2 IMPLANT
CANISTER SUCT 3000ML (MISCELLANEOUS) ×2 IMPLANT
CONT SPEC 4OZ CLIKSEAL STRL BL (MISCELLANEOUS) ×2 IMPLANT
DERMABOND ADVANCED (GAUZE/BANDAGES/DRESSINGS) ×1
DERMABOND ADVANCED .7 DNX12 (GAUZE/BANDAGES/DRESSINGS) ×1 IMPLANT
DRAPE LAPAROTOMY 100X72X124 (DRAPES) ×2 IMPLANT
DRAPE MICROSCOPE LEICA (MISCELLANEOUS) ×4 IMPLANT
DRAPE POUCH INSTRU U-SHP 10X18 (DRAPES) ×2 IMPLANT
DRAPE SURG 17X23 STRL (DRAPES) ×2 IMPLANT
DRILL NEURO 2X3.1 SOFT TOUCH (MISCELLANEOUS) ×2
DRSG OPSITE POSTOP 4X6 (GAUZE/BANDAGES/DRESSINGS) ×2 IMPLANT
DRSG TELFA 3X8 NADH (GAUZE/BANDAGES/DRESSINGS) IMPLANT
DURAPREP 26ML APPLICATOR (WOUND CARE) ×2 IMPLANT
ELECT BLADE 4.0 EZ CLEAN MEGAD (MISCELLANEOUS) ×2
ELECT REM PT RETURN 9FT ADLT (ELECTROSURGICAL) ×2
ELECTRODE BLDE 4.0 EZ CLN MEGD (MISCELLANEOUS) ×1 IMPLANT
ELECTRODE REM PT RTRN 9FT ADLT (ELECTROSURGICAL) ×1 IMPLANT
GAUZE SPONGE 4X4 12PLY STRL (GAUZE/BANDAGES/DRESSINGS) IMPLANT
GAUZE SPONGE 4X4 16PLY XRAY LF (GAUZE/BANDAGES/DRESSINGS) IMPLANT
GLOVE BIO SURGEON STRL SZ8 (GLOVE) ×2 IMPLANT
GLOVE BIOGEL PI IND STRL 7.5 (GLOVE) ×2 IMPLANT
GLOVE BIOGEL PI IND STRL 8 (GLOVE) ×1 IMPLANT
GLOVE BIOGEL PI IND STRL 8.5 (GLOVE) ×1 IMPLANT
GLOVE BIOGEL PI INDICATOR 7.5 (GLOVE) ×2
GLOVE BIOGEL PI INDICATOR 8 (GLOVE) ×1
GLOVE BIOGEL PI INDICATOR 8.5 (GLOVE) ×1
GLOVE ECLIPSE 8.0 STRL XLNG CF (GLOVE) ×2 IMPLANT
GLOVE EXAM NITRILE LRG STRL (GLOVE) IMPLANT
GLOVE EXAM NITRILE MD LF STRL (GLOVE) IMPLANT
GLOVE EXAM NITRILE XL STR (GLOVE) IMPLANT
GLOVE EXAM NITRILE XS STR PU (GLOVE) IMPLANT
GOWN STRL REUS W/ TWL LRG LVL3 (GOWN DISPOSABLE) IMPLANT
GOWN STRL REUS W/ TWL XL LVL3 (GOWN DISPOSABLE) ×2 IMPLANT
GOWN STRL REUS W/TWL 2XL LVL3 (GOWN DISPOSABLE) ×2 IMPLANT
GOWN STRL REUS W/TWL LRG LVL3 (GOWN DISPOSABLE)
GOWN STRL REUS W/TWL XL LVL3 (GOWN DISPOSABLE) ×2
KIT BASIN OR (CUSTOM PROCEDURE TRAY) ×2 IMPLANT
KIT ROOM TURNOVER OR (KITS) ×2 IMPLANT
NEEDLE HYPO 18GX1.5 BLUNT FILL (NEEDLE) ×2 IMPLANT
NEEDLE HYPO 25X1 1.5 SAFETY (NEEDLE) ×2 IMPLANT
NS IRRIG 1000ML POUR BTL (IV SOLUTION) ×2 IMPLANT
PACK LAMINECTOMY NEURO (CUSTOM PROCEDURE TRAY) ×2 IMPLANT
PAD ARMBOARD 7.5X6 YLW CONV (MISCELLANEOUS) ×6 IMPLANT
RUBBERBAND STERILE (MISCELLANEOUS) ×4 IMPLANT
SPONGE SURGIFOAM ABS GEL SZ50 (HEMOSTASIS) ×2 IMPLANT
STRIP CLOSURE SKIN 1/2X4 (GAUZE/BANDAGES/DRESSINGS) IMPLANT
SUT VIC AB 0 CT1 18XCR BRD8 (SUTURE) ×1 IMPLANT
SUT VIC AB 0 CT1 8-18 (SUTURE) ×1
SUT VIC AB 2-0 CT1 18 (SUTURE) ×2 IMPLANT
SUT VIC AB 3-0 SH 8-18 (SUTURE) ×2 IMPLANT
SYR 20ML ECCENTRIC (SYRINGE) ×2 IMPLANT
SYR 5ML LL (SYRINGE) ×2 IMPLANT
TOWEL OR 17X24 6PK STRL BLUE (TOWEL DISPOSABLE) ×2 IMPLANT
TOWEL OR 17X26 10 PK STRL BLUE (TOWEL DISPOSABLE) ×2 IMPLANT
WATER STERILE IRR 1000ML POUR (IV SOLUTION) ×2 IMPLANT

## 2013-09-11 NOTE — Progress Notes (Signed)
ANTIBIOTIC CONSULT NOTE - INITIAL  Pharmacy Consult:  Vancomycin Indication:  Surgical prophylaxis  Allergies  Allergen Reactions  . Latex     blisters  . Iohexol      Code: RASH, Onset Date: [id number]   . Sulfa Antibiotics   . Penicillins Itching    PATIENT TOLERATED KEFLEX    Patient Measurements: Height: 5' 8.9" (175 cm) Weight: 248 lb (112.492 kg) IBW/kg (Calculated) : 70.47  Vital Signs: Temp: 97.2 F (36.2 C) (09/03 1745) Temp src: Oral (09/03 1154) BP: 160/70 mmHg (09/03 1819) Pulse Rate: 65 (09/03 1819) Intake/Output from this shift: Total I/O In: 1200 [I.V.:1200] Out: 200 [Urine:200]  Labs: No results found for this basename: WBC, HGB, PLT, LABCREA, CREATININE,  in the last 72 hours Estimated Creatinine Clearance: 86.1 ml/min (by C-G formula based on Cr of 1). No results found for this basename: VANCOTROUGH, Corlis Leak, VANCORANDOM, GENTTROUGH, GENTPEAK, GENTRANDOM, TOBRATROUGH, TOBRAPEAK, TOBRARND, AMIKACINPEAK, AMIKACINTROU, AMIKACIN,  in the last 72 hours   Microbiology: Recent Results (from the past 720 hour(s))  SURGICAL PCR SCREEN     Status: Abnormal   Collection Time    09/08/13 12:43 PM      Result Value Ref Range Status   MRSA, PCR NEGATIVE  NEGATIVE Final   Staphylococcus aureus POSITIVE (*) NEGATIVE Final   Comment:            The Xpert SA Assay (FDA     approved for NASAL specimens     in patients over 63 years of age),     is one component of     a comprehensive surveillance     program.  Test performance has     been validated by [employer] for patients greater     than or equal to 66 year old.     It is not intended     to diagnose infection nor to     guide or monitor treatment.    Medical History: Past Medical History  Diagnosis Date  . Arthritis   . Cancer     L EYE - MELANOMA  . GERD (gastroesophageal reflux disease)   . Diabetes mellitus     fasting cbg 90-100     Assessment: 68 YOM s/p lumbar microdiskectomy  to continue vancomycin post-op for surgical prophylaxis.  Confirmed with RN patient does not have a drain; therefore, will only order one dose.  Goal of Therapy:  Infection prevention   Plan:  - Vanc 1gm IV x 1 in AM - Pharmacy will sign off.   Dorrene Bently D. Mina Marble, PharmD, BCPS Pager:  [phone] 09/11/2013, 6:38 PM

## 2013-09-11 NOTE — Brief Op Note (Signed)
09/11/2013  4:37 PM  PATIENT:  [name]  68 y.o. 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:200] Out: -   BLOOD ADMINISTERED:none  DRAINS: none   LOCAL MEDICATIONS USED:  LIDOCAINE   SPECIMEN:  No Specimen  DISPOSITION OF SPECIMEN:  N/A  COUNTS:  YES  TOURNIQUET:  * No tourniquets in log *  DICTATION: Patient has a large L4-5 disc rupture midline on the left with significant left leg weakness. It was elected to take him to surgery for left L4-5 microdiscectomy.  Procedure: Patient was brought to the operating room and following the smooth and uncomplicated induction of general endotracheal anesthesia he was placed in a prone position on the Wilson frame. Low back was prepped and draped in the usual sterile fashion with betadine scrub and DuraPrep. Area of planned incision was infiltrated with local lidocaine. Incision was made in the midline and carried to the lumbodorsal fascia which was incised on the right side of midline. Subperiosteal dissection was performed exposing what was felt to be L45 level. Intraoperative x-ray demonstrated marker probes at L5S1. Exposure was carried cephalad to expose the L4-5 level. A hemi-semi-laminectomy of L4 was performed a high-speed drill and completed with Kerrison rongeurs and a generous foraminotomy  was performed overlying the superior aspect of the L5 lamina. Ligamentum flavum was detached and removed in a piecemeal fashion and the L5 nerve root was decompressed laterally with removal of the superior aspect of the facet and ligamentum causing nerve root compression. The microscope was brought into the field and the L5 nerve root was mobilized medially. This exposed a central disc herniation.  The interspace was incised and a large amount of disc material was removed. Multiple fragments were removed and these extended into the interspace which appeared to be quite soft with a disrupted annulus overlying the interspace. As a result it was elected to further decompress the interspace and remove loose disc material and this was done with a variety Epstein curettes and pituitary rongeurs. The redundant annulus was also removed. Decompression was carried across the midline and the thecal sac appeared to be significantly less compressed. At this point it was felt that all neural elements were well decompressed and there was no evidence of residual loose disc material within the interspace. The interspace was then irrigated with saline and no additional disc material was mobilized. Hemostasis was assured with bipolar electrocautery and the interspace was irrigated with Depo-Medrol and fentanyl. The lumbodorsal fascia was closed with 0 Vicryl sutures the subcutaneous tissues reapproximated 2-0 Vicryl inverted sutures and the skin edges were reapproximated with 3-0 Vicryl subcuticular stitch. The wound is dressed with Dermabond and a sterile occlusive dressing. Patient was extubated in the operating room and taken to recovery in stable and satisfactory condition having tolerated his operation well counts were correct at the end of the case.  PLAN OF CARE: Admit  for overnight observation  PATIENT DISPOSITION:  PACU - hemodynamically stable.   Delay start of Pharmacological VTE agent (>24hrs) due to surgical blood  loss or risk of bleeding: yes

## 2013-09-11 NOTE — Op Note (Signed)
09/11/2013  4:37 PM  PATIENT:  [name]  68 y.o. 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:200] Out: -   BLOOD ADMINISTERED:none  DRAINS: none   LOCAL MEDICATIONS USED:  LIDOCAINE   SPECIMEN:  No Specimen  DISPOSITION OF SPECIMEN:  N/A  COUNTS:  YES  TOURNIQUET:  * No tourniquets in log *  DICTATION: Patient has a large L4-5 disc rupture midline on the left with significant left leg weakness. It was elected to take him to surgery for left L4-5 microdiscectomy.  Procedure: Patient was brought to the operating room and following the smooth and uncomplicated induction of general endotracheal anesthesia he was placed in a prone position on the Wilson frame. Low back was prepped and draped in the usual sterile fashion with betadine scrub and DuraPrep. Area of planned incision was infiltrated with local lidocaine. Incision was made in the midline and carried to the lumbodorsal fascia which was incised on the right side of midline. Subperiosteal dissection was performed exposing what was felt to be L45 level. Intraoperative x-ray demonstrated marker probes at L5S1. Exposure was carried cephalad to expose the L4-5 level. A hemi-semi-laminectomy of L4 was performed a high-speed drill and completed with Kerrison rongeurs and a generous foraminotomy  was performed overlying the superior aspect of the L5 lamina. Ligamentum flavum was detached and removed in a piecemeal fashion and the L5 nerve root was decompressed laterally with removal of the superior aspect of the facet and ligamentum causing nerve root compression. The microscope was brought into the field and the L5 nerve root was mobilized medially. This exposed a central disc herniation.  The interspace was incised and a large amount of disc material was removed. Multiple fragments were removed and these extended into the interspace which appeared to be quite soft with a disrupted annulus overlying the interspace. As a result it was elected to further decompress the interspace and remove loose disc material and this was done with a variety Epstein curettes and pituitary rongeurs. The redundant annulus was also removed. Decompression was carried across the midline and the thecal sac appeared to be significantly less compressed. At this point it was felt that all neural elements were well decompressed and there was no evidence of residual loose disc material within the interspace. The interspace was then irrigated with saline and no additional disc material was mobilized. Hemostasis was assured with bipolar electrocautery and the interspace was irrigated with Depo-Medrol and fentanyl. The lumbodorsal fascia was closed with 0 Vicryl sutures the subcutaneous tissues reapproximated 2-0 Vicryl inverted sutures and the skin edges were reapproximated with 3-0 Vicryl subcuticular stitch. The wound is dressed with Dermabond and a sterile occlusive dressing. Patient was extubated in the operating room and taken to recovery in stable and satisfactory condition having tolerated his operation well counts were correct at the end of the case.  PLAN OF CARE: Admit  for overnight observation  PATIENT DISPOSITION:  PACU - hemodynamically stable.   Delay start of Pharmacological VTE agent (>24hrs) due to surgical blood  loss or risk of bleeding: yes

## 2013-09-11 NOTE — Plan of Care (Signed)
Problem: Consults Goal: Diagnosis - Spinal Surgery Outcome: Completed/Met Date Met:  09/11/13 Microdiscectomy

## 2013-09-11 NOTE — Transfer of Care (Signed)
Immediate Anesthesia Transfer of Care Note  Patient: [name]  Procedure(s) Performed: Procedure(s) with comments: Left Lumbar Four-Five  Microdiskectomy (Left) - Left  Lumbar Four-Five  Microdiskectomy  Patient Location: PACU  Anesthesia Type:General  Level of Consciousness: awake, alert  and oriented  Airway & Oxygen Therapy: Patient Spontanous Breathing  Post-op Assessment: Report given to PACU RN  Post vital signs: Reviewed and stable  Complications: No apparent anesthesia complications

## 2013-09-11 NOTE — Progress Notes (Signed)
Awake, alert, conversant.  Full strength both lower extremities.  Doing well.  

## 2013-09-11 NOTE — Anesthesia Preprocedure Evaluation (Addendum)
Anesthesia Evaluation  Patient identified by MRN, date of birth, ID band Patient awake    Reviewed: Allergy & Precautions, H&P , NPO status , Patient's Chart, lab work & pertinent test results  Airway       Dental   Pulmonary          Cardiovascular     Neuro/Psych    GI/Hepatic GERD-  ,  Endo/Other  diabetes, Type 2, Oral Hypoglycemic Agents  Renal/GU      Musculoskeletal  (+) Arthritis -,   Abdominal   Peds  Hematology  (+) anemia ,   Anesthesia Other Findings   Reproductive/Obstetrics                          Anesthesia Physical Anesthesia Plan  ASA: II  Anesthesia Plan: General   Post-op Pain Management:    Induction: Intravenous  Airway Management Planned: Oral ETT  Additional Equipment:   Intra-op Plan:   Post-operative Plan: Extubation in OR  Informed Consent: I have reviewed the patients History and Physical, chart, labs and discussed the procedure including the risks, benefits and alternatives for the proposed anesthesia with the patient or authorized representative who has indicated his/her understanding and acceptance.     Plan Discussed with: CRNA, Anesthesiologist and Surgeon  Anesthesia Plan Comments:         Anesthesia Quick Evaluation

## 2013-09-11 NOTE — Anesthesia Postprocedure Evaluation (Signed)
  Anesthesia Post-op Note  Patient: [name]  Procedure(s) Performed: Procedure(s) with comments: Left Lumbar Four-Five  Microdiskectomy (Left) - Left  Lumbar Four-Five  Microdiskectomy  Patient Location: PACU  Anesthesia Type:General  Level of Consciousness: awake  Airway and Oxygen Therapy: Patient Spontanous Breathing  Post-op Pain: mild  Post-op Assessment: Post-op Vital signs reviewed  Post-op Vital Signs: Reviewed   Last Vitals:  Filed Vitals:   09/11/13 1645  BP: 141/87  Pulse:   Temp: 36.7 C  Resp: 19    Complications: No apparent anesthesia complications

## 2013-09-11 NOTE — H&P (Signed)
[place], [place] [address] Phone: [phone]   Patient ID:   [id number] Patient: [name]  Date of Birth: December 28, 1945 Visit Type: Office Visit   Date: 08/27/2013 10:30 AM Provider: Marchia Meiers. Vertell Limber MD   This 68 year old male presents for back pain and Leg pain.  History of Present Illness: 1.  back pain  2.  Leg pain  [name], [phone].o. male reports low back pain x6weeks.  He recalls a day of twisting at waist level while operating a wood splitter about 6 weeks ago. He denies any heavy lifting.  He reports lumbar pain, with some epigastric soreness recently, and BLE numbnes/tingling.  Norco 10/325  TID-QID Duragesic patch not used d/t GI upset  Hx: NIDDM, Melanoma OS (removed 1992) SxHx: 2013 Lt TKR,  post-traumatic infection/ddebridement LLE 2014; left eye removal 1992  MRI on [facility]  Patient is 5'10" tall and 246 pounds.    He complains of weakness in both legs, left greater than right as well as bilateral buttock pain and leg pain, but says his legs are worse than his back.  He has to walk with a flexed attitude.  He denies bowel or bladder dysfunction.        PAST MEDICAL/SURGICAL HISTORY   (Detailed)  Disease/disorder Onset Date Management Date Comments    Knee surgery 2013       PAST MEDICAL HISTORY, SURGICAL HISTORY, FAMILY HISTORY, SOCIAL HISTORY AND REVIEW OF SYSTEMS I have reviewed the patient's past medical, surgical, family and social history as well as the comprehensive review of systems as included on the [facility] history form dated 08/27/2013, which I have signed.  Family History  (Detailed)  Relationship Family Member Name Deceased Age at Death Condition Onset Age Cause of Death      Family history of Diabetes mellitus  N      Family history of Hypertension  N   SOCIAL HISTORY  (Detailed) Tobacco use reviewed. Preferred language is Unknown.   Smoking status: Never  smoker.  SMOKING STATUS Use Status Type Smoking Status Usage Per Day Years Used Total Pack Years  no/never  Never smoker       HOME ENVIRONMENT/SAFETY  The Patient has fallen 1 times in the last year.        MEDICATIONS(added, continued or stopped this visit):   Started Medication Directions Instruction Stopped   alprazolam 1 mg tablet take 1 tablet by oral route 3 times every day as needed     Januvia 100 mg tablet take 1 tablet by oral route  every day     Norco 10 mg-325 mg tablet take 1 tablet by oral route  every 8 hours as needed for pain     pioglitazone 45 mg tablet take 1 tablet by oral route  every day      ALLERGIES:  Ingredient Reaction Medication Name Comment  IOVERSOL Unknown    Reviewed, updated.  REVIEW OF SYSTEMS System Neg/Pos Details  Constitutional Negative Chills, fatigue, fever, malaise, night sweats, weight gain and weight loss.  ENMT Negative Ear drainage, hearing loss, nasal drainage, otalgia, sinus pressure and sore throat.  Eyes Negative Eye discharge, eye pain and vision changes.  Respiratory Negative Chronic cough, cough, dyspnea, known TB exposure and wheezing.  Cardio Negative Chest pain, claudication, edema and irregular heartbeat/palpitations.  GI Negative Abdominal pain, blood in stool, change in stool pattern, constipation, decreased appetite, diarrhea, heartburn, nausea and vomiting.  GU Negative  Dribbling, dysuria, erectile dysfunction, hematuria, polyuria, slow stream, urinary frequency, urinary incontinence and urinary retention.  Endocrine Negative Cold intolerance, heat intolerance, polydipsia and polyphagia.  Neuro Positive Numbness in extremity.  Psych Positive Anxiety.  Integumentary Negative Brittle hair, brittle nails, change in shape/size of mole(s), hair loss, hirsutism, hives, pruritus, rash and skin lesion.  MS Positive Back pain.  Hema/Lymph Negative Easy bleeding, easy bruising and lymphadenopathy.  Allergic/Immuno  Negative Contact allergy, environmental allergies, food allergies and seasonal allergies.  Reproductive Negative Penile discharge and sexual dysfunction.    Vitals Date Temp F BP Pulse Ht In Wt Lb BMI BSA Pain Score  08/27/2013  149/83 62 70 246 35.3  7/10     PHYSICAL EXAM General Level of Distress: no acute distress Overall Appearance: normal    Cardiovascular Cardiac: regular rate and rhythm without murmur  Respiratory Lungs: clear to auscultation  Neurological Recent and Remote Memory: normal Attention Span and Concentration:   normal Language: normal Fund of Knowledge: normal  Right Left Sensation: normal normal Upper Extremity Coordination: normal normal  Lower Extremity Coordination: normal normal  Musculoskeletal Gait and Station: normal  Right Left Upper Extremity Muscle Strength: normal normal Lower Extremity Muscle Strength: normal normal Upper Extremity Muscle Tone:  normal normal Lower Extremity Muscle Tone: normal normal  Motor Strength Upper and lower extremity motor strength was tested in the clinically pertinent muscles. Any abnormal findings will be noted below.   Right Left EHL:  4/5   Deep Tendon Reflexes  Right Left Biceps: normal normal Triceps: normal normal Brachiloradialis: normal normal Patellar: normal normal Achilles: normal normal  Sensory Sensation was tested at L1 to S1.   Cranial Nerves II. Optic Nerve/Visual Fields: normal III. Oculomotor: normal IV. Trochlear: normal V. Trigeminal: normal VI. Abducens: normal VII. Facial: normal VIII. Acoustic/Vestibular: normal IX. Glossopharyngeal: normal X. Vagus: normal XI. Spinal Accessory: normal XII. Hypoglossal: normal  Motor and other Tests Lhermittes: negative Rhomberg: negative    Right Left Hoffman's: normal normal Clonus: normal normal Babinski: normal normal SLR: positive at 45 degrees positive at 30 degrees Patrick's Corky Sox): negative negative Toe  Walk: normal normal Toe Lift: normal normal Heel Walk: normal normal SI Joint: nontender nontender   Additional Findings:  Patient notes bilateral lower extremity numbness, but has a history of neuropathy.    DIAGNOSTIC RESULTS Diagnostic report text  CLINICAL DATA: Low back pain radiating into the left leg and foot for 3-4 weeks.  EXAM: MRI LUMBAR SPINE WITHOUT CONTRAST  TECHNIQUE: Multiplanar, multisequence MR imaging of the lumbar spine was performed. No intravenous contrast was administered.  COMPARISON: MRI lumbar spine 01/12/2004. Plain films lumbar spine 06/19/2012.  FINDINGS: A few small hemangiomas are identified. Vertebral body signal is otherwise unremarkable. Vertebral body height and alignment are maintained. Imaged intra-abdominal contents are unremarkable.  The T11-12 and T12-L1 levels are imaged in the sagittal plane only. There is a minimal disc bulge without central canal or foraminal narrowing.  L1-2: Negative.  L2-3: Negative.  L3-4: Mild facet degenerative disease and minimal disc bulging without central canal or foraminal narrowing.  L4-5: The patient has a large central disc protrusion causing severe central canal stenosis. There is some facet degenerative disease at this level. Foramina are open.  L5-S1: Tiny central protrusion is identified. The central canal and foramina are open.  IMPRESSION: Large central protrusion at L4-5 causes severe central canal stenosis.  Tiny central protrusion L5-S1 without central canal or foraminal narrowing.   Electronically Signed By: Inge Rise M.D. On: 05/23/2013  11:45    IMPRESSION Patient has a large central disc herniation at L 45 with left greater than right leg weakness and severe pain.  I have recommended discectomy from the left, but with the possibility of bilateral microdiscectomy at the L 45 level.  Completed Orders (this encounter) Order Details Reason Side Interpretation  Result Initial Treatment Date Region  Lumbar Spine- AP/Lat      08/27/2013    Assessment/Plan # Detail Type Description   1. Assessment Herniated lumbar intervertebral disc (722.10).       2. Assessment Lumbar spinal stenosis (724.02).       3. Assessment Lumbar disc degenerative disease (722.52).       4. Assessment Lumbar radiculopathy (724.4).         Pain Assessment/Treatment Pain Scale: 7/10. Method: Numeric Pain Intensity Scale. Location: back/legs. Onset: 06/27/2013. Duration: varies. Quality: discomforting. Pain Assessment/Treatment follow-up plan of care: Patient is currently taking medication for pain as prescribed..  Fall Risk Plan The Patient has fallen 1 times in the last year.  Falls risk follow-up plan of care: Assisted devices: Advised patient to use handrails..  Lumbar microdiscectomy L 45 on 09/11/13.  Orders: Diagnostic Procedures: Assessment Procedure   Lumbar Spine- AP/Lat             Provider:  Marchia Meiers. Vertell Limber MD  09/11/2013 11:17 AM Dictation edited by: Marchia Meiers. Vertell Limber    CC Providers: [place] [address], [place] [id number]- ----------------------------------------------------------------------------------------------------------------------------------------------------------------------         Electronically signed by Marchia Meiers. Vertell Limber MD on 09/11/2013 11:17 AM

## 2013-09-12 ENCOUNTER — Encounter (HOSPITAL_COMMUNITY): Payer: Self-pay | Admitting: Neurosurgery

## 2013-09-12 DIAGNOSIS — M5126 Other intervertebral disc displacement, lumbar region: Secondary | ICD-10-CM | POA: Diagnosis not present

## 2013-09-12 LAB — GLUCOSE, CAPILLARY
Glucose-Capillary: 92 mg/dL (ref 70–99)
Glucose-Capillary: 132 mg/dL — ABNORMAL HIGH (ref 70–99)

## 2013-09-12 LAB — HEMOGLOBIN A1C
HEMOGLOBIN A1C: 6.2 % — AB (ref <5.7)
Mean Plasma Glucose: 131 mg/dL — ABNORMAL HIGH (ref <117)

## 2013-09-12 MED ORDER — DIPHENHYDRAMINE HCL 25 MG PO CAPS: 25.0000 mg | ORAL_CAPSULE | Freq: Four times a day (QID) | ORAL | Status: DC | PRN

## 2013-09-12 MED ORDER — TAMSULOSIN HCL 0.4 MG PO CAPS
0.4000 mg | ORAL_CAPSULE | Freq: Every day | ORAL | Status: DC
  Filled 2013-09-12 (×2): qty 1

## 2013-09-12 NOTE — Progress Notes (Signed)
Subjective: Patient reports "Tell your doctor "thank you"!  This leg is all better"  Objective: Vital signs in last 24 hours: Temp:  [97.2 F (36.2 C)-98.4 F (36.9 C)] 98.4 F (36.9 C) (09/04 0347) Pulse Rate:  [60-79] 79 (09/04 0836) Resp:  [12-20] 18 (09/04 0836) BP: (112-170)/(67-87) 170/69 mmHg (09/04 0836) SpO2:  [94 %-100 %] 96 % (09/04 0836) Weight:  [112.492 kg (248 lb)] 112.492 kg (248 lb) (09/03 1154)  Intake/Output from previous day: 09/03 0701 - 09/04 0700 In: 1680 [P.O.:480; I.V.:1200] Out: 4129 [Urine:4129] Intake/Output this shift: Total I/O In: -  Out: 100 [Urine:100]  Alert, sitting in chair. Incisional pain, but no leg pain since surgery. Full strength BLE. Incision with dermabond & honeycomb drsg; no erythema, swelling, or drainage. Urinary retention required I&O caths x2 d/t residual.  Lab Results: No results found for this basename: WBC, HGB, HCT, PLT,  in the last 72 hours BMET No results found for this basename: NA, K, CL, CO2, GLUCOSE, BUN, CREATININE, CALCIUM,  in the last 72 hours  Studies/Results: Dg Lumbar Spine 2-3 Views  09/11/2013   CLINICAL DATA:  L4-5 microdiskectomy.  EXAM: LUMBAR SPINE - 2-3 VIEW  COMPARISON:  MRI scan of May 23, 2013.  FINDINGS: Two lateral intraoperative views of the lumbar spine were submitted for review. The first image demonstrates surgical probe directed toward posterior portion of the L5-S1 disc space. The second image demonstrates surgical probe directed poor posterior portion of L4-5 disc space.  IMPRESSION: Surgical localization as described above.   Electronically Signed   By: Sabino Dick M.D.   On: 09/11/2013 17:49    Assessment/Plan: Improving   LOS: 1 day  Per DrStern, start Flomax 0.4mg  po daily; if urinary retention issue persists, place indwelling Foley & pt should f/u with urologist. Madaline Brilliant to d/c to home. Pt verbalizes understanding of d/c instructions.  Rx's to chart for Flomax, Norco, & Tizanidine. He will  call office to schedule 3-4 wk f/u with DrStern   Verdis Prime 09/12/2013, 9:03 AM

## 2013-09-12 NOTE — Evaluation (Addendum)
Physical Therapy Evaluation/Discharge Patient Details Name: [name] MRN: [id number] DOB: August 26, 1945 Today's Date: 09/12/2013   History of Present Illness  Pt with back and leg pain admitted for L4-5 microdiscectomy  Clinical Impression  Pt very pleasant and eager to be mobilizing for deer hunting. Pt educated for all precautions and restrictions with demonstration of understanding. Educated for transfers, ADLs, gait, steps, precautions and mobility. All education complete without further need for therapy at this time with pt aware and agreeable.    Follow Up Recommendations No PT follow up    Equipment Recommendations  None recommended by PT    Recommendations for Other Services       Precautions / Restrictions Precautions Precautions: Back Precaution Booklet Issued: Yes (comment)      Mobility  Bed Mobility Overal bed mobility: Modified Independent             General bed mobility comments: able to perform after instruction  Transfers Overall transfer level: Modified independent                  Ambulation/Gait Ambulation/Gait assistance: Modified independent (Device/Increase time) Ambulation Distance (Feet): 300 Feet Assistive device: None Gait Pattern/deviations: WFL(Within Functional Limits)   Gait velocity interpretation: at or above normal speed for age/gender    Stairs            Wheelchair Mobility    Modified Rankin (Stroke Patients Only)       Balance                                             Pertinent Vitals/Pain Pain Assessment: 0-10 Pain Score: 1  Pain Location: incision Pain Descriptors / Indicators: Aching Pain Intervention(s): Premedicated before session;Repositioned    Home Living Family/patient expects to be discharged to:: Private residence Living Arrangements: Alone Available Help at Discharge: Family;Available PRN/intermittently Type of Home: House Home Access: Stairs to  enter Entrance Stairs-Rails: None Entrance Stairs-Number of Steps: 1 Home Layout: One level Home Equipment: None      Prior Function Level of Independence: Needs assistance   Gait / Transfers Assistance Needed: independent  ADL's / Homemaking Assistance Needed: 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 Pt performs ADLs on his own        Hand Dominance        Extremity/Trunk Assessment   Upper Extremity Assessment: Overall WFL for tasks assessed           Lower Extremity Assessment: Overall WFL for tasks assessed      Cervical / Trunk Assessment: Normal  Communication   Communication: No difficulties  Cognition Arousal/Alertness: Awake/alert Behavior During Therapy: WFL for tasks assessed/performed Overall Cognitive Status: Within Functional Limits for tasks assessed                      General Comments      Exercises        Assessment/Plan    PT Assessment Patent does not need any further PT services  PT Diagnosis     PT Problem List    PT Treatment Interventions     PT Goals (Current goals can be found in the Care Plan section) Acute Rehab PT Goals PT Goal Formulation: No goals set, d/c therapy    Frequency     Barriers to discharge        Co-evaluation  End of Session   Activity Tolerance: Patient tolerated treatment well Patient left: in chair;with call bell/phone within reach Nurse Communication: Mobility status;Precautions    Functional Assessment Tool Used: clinical judgement Functional Limitation: Mobility: Walking and moving around Mobility: Walking and Moving Around Current Status (E3662): At least 1 percent but less than 20 percent impaired, limited or restricted Mobility: Walking and Moving Around Goal Status [phone]): At least 1 percent but less than 20 percent impaired, limited or restricted Mobility: Walking and Moving Around Discharge Status [phone]): At least 1 percent but less than 20 percent  impaired, limited or restricted    Time: 0805-0821 PT Time Calculation (min): 16 min   Charges:   PT Evaluation $Initial PT Evaluation Tier I: 1 Procedure PT Treatments $Therapeutic Activity: 8-22 mins   PT G Codes:   Functional Assessment Tool Used: clinical judgement Functional Limitation: Mobility: Walking and moving around    Melford Aase 09/12/2013, 8:31 AM Elwyn Reach, [place]

## 2013-09-12 NOTE — Discharge Summary (Signed)
Physician Discharge Summary  Patient ID: [name] MRN: [id number] DOB/AGE: [birth year] 68 y.o.  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  Following uncomplicated microdiscectomy, pt recovered nicely & transferred to 3500 for overnight observation.   Consults: None  Significant Diagnostic Studies: radiology: X-Ray: intra-operative  Treatments: surgery: Left Lumbar Four-Five  Microdiskectomy (Left) - Left  Lumbar Four-Five  Microdiskectomy with microdissection   Discharge Exam: Blood pressure 170/69, pulse 79, temperature 98.4 F (36.9 C), temperature source Oral, resp. rate 18, height 5' 8.9" (1.75 m), weight 112.492 kg (248 lb), SpO2 96.00%. Alert, sitting in chair. Incisional pain, but no leg pain since surgery. Full strength BLE. Incision with dermabond & honeycomb drsg; no erythema, swelling, or drainage. Urinary retention required I&O caths x2 d/t residual.   Disposition: 01-Home or Self Care  Pt verbalizes understanding of d/c instructions.  Rx's to chart for Flomax, Norco, & Tizanidine. He will call office to schedule 3-4 wk f/u with DrStern.  Will d/c with Foley Catheter & referral to Urology if retention persists this am.   Re-eval at 0955: bladder scan 376ml post-void.  Ok to d/c to home without foley cath per DrStern. Pt will call if voiding does not  improve & urology referral will be made.       Medication List    ASK your doctor about these medications       ALPRAZolam 1 MG tablet  Commonly known as:  XANAX  Take 1 mg by mouth 2 (two) times daily as needed. Anxiety     aspirin EC 81 MG tablet  Take 81 mg by mouth daily.     brimonidine 0.2 % ophthalmic solution  Commonly known as:  ALPHAGAN  Place 1 drop into the right eye 2 (two) times daily.     HYDROcodone-acetaminophen 10-325 MG per tablet  Commonly known as:  NORCO  Take 1 tablet by mouth every 4 (four) hours as needed for moderate pain.     meloxicam 15 MG tablet  Commonly known as:  MOBIC  Take 15 mg by mouth daily.     pioglitazone 45 MG tablet  Commonly known as:  ACTOS  Take 45 mg by mouth daily with breakfast.     ranitidine 150 MG tablet  Commonly known as:  ZANTAC  Take 150 mg by mouth 2 (two) times daily.     sitaGLIPtin 100 MG tablet  Commonly known as:  JANUVIA  Take 100 mg by mouth daily with breakfast.     VITAMIN B12 PO  Take 1 tablet by mouth daily.         Signed: Verdis Prime 09/12/2013, 9:10 AM

## 2013-09-12 NOTE — Evaluation (Addendum)
Occupational Therapy Evaluation Patient Details Name: [name] MRN: [id number] DOB: 1945/10/29 Today's Date: 09/12/2013    History of Present Illness Pt with back and leg pain admitted for L4-5 microdiscectomy   Clinical Impression   Pt s/p L4-5 microdiscectomy. Pt able to complete ADLs at mod I level after instruction on compensatory techniques to maintain back precautions. Recommend 3n1 for home (for over toilet) in order to maximize safety and independence with toilet transfers.   Follow Up Recommendations  No OT follow up;Supervision - Intermittent    Equipment Recommendations  3 in 1 bedside comode    Recommendations for Other Services       Precautions / Restrictions Precautions Precautions: Back Precaution Booklet Issued: Yes (comment) Precaution Comments: Reviewed back precautions. Restrictions Weight Bearing Restrictions: No      Mobility Bed Mobility Overal bed mobility: Modified Independent             General bed mobility comments: demonstrated bed mobility while independently maintaining back precautions  Transfers Overall transfer level: Modified independent                    Balance                                            ADL Overall ADL's : Modified independent                                       General ADL Comments: OT instructed on compensatory ADL techniques such as crossing ankles over knees in order to perform LB ADLs.  Pt mod I with toilet transfer (toilet in room is comfort height). However, toilet at home is standard height, and pt will need 3n1 for home in order to ensure independence with toilet transfers.     Vision                     Perception     Praxis      Pertinent Vitals/Pain Pain Assessment: 0-10 Pain Score: 2  Pain Location: back (incision site) Pain Descriptors / Indicators: Aching Pain Intervention(s): Repositioned     Hand Dominance      Extremity/Trunk Assessment Upper Extremity Assessment Upper Extremity Assessment: Overall WFL for tasks assessed   Lower Extremity Assessment Lower Extremity Assessment: Overall WFL for tasks assessed   Cervical / Trunk Assessment Cervical / Trunk Assessment: Normal   Communication Communication Communication: No difficulties   Cognition Arousal/Alertness: Awake/alert Behavior During Therapy: WFL for tasks assessed/performed Overall Cognitive Status: Within Functional Limits for tasks assessed                     General Comments       Exercises       Shoulder Instructions      Home Living Family/patient expects to be discharged to:: Private residence Living Arrangements: Alone Available Help at Discharge: Family;Available PRN/intermittently Type of Home: House Home Access: Stairs to enter Entrance Stairs-Number of Steps: 1 Entrance Stairs-Rails: None Home Layout: One level     Bathroom Shower/Tub: Occupational psychologist: Standard     Home Equipment: None          Prior Functioning/Environment Level of Independence: Needs assistance  Gait / Transfers Assistance Needed:  independent ADL's / Homemaking Assistance Needed: 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 Pt performs ADLs on his own        OT Diagnosis:     OT Problem List:     OT Treatment/Interventions:      OT Goals(Current goals can be found in the care plan section)    OT Frequency:     Barriers to D/C:            Co-evaluation              End of Session Equipment Utilized During Treatment: Gait belt Nurse Communication: Mobility status  Activity Tolerance: Patient tolerated treatment well Patient left: in bed;with call bell/phone within reach   Time: 0488-8916 OT Time Calculation (min): 14 min Charges:  OT General Charges $OT Visit: 1 Procedure OT Evaluation $Initial OT Evaluation Tier I: 1 Procedure OT Treatments $Self Care/Home Management :  8-22 mins G-Codes: OT G-codes **NOT FOR INPATIENT CLASS** Functional Assessment Tool Used: clinical judgement Functional Limitation: Self care Self Care Current Status (X4503): 0 percent impaired, limited or restricted Self Care Goal Status (U8828): 0 percent impaired, limited or restricted Self Care Discharge Status (M0349): 0 percent impaired, limited or restricted  Darrol Jump 09/12/2013, 10:32 AM  09/12/2013 Darrol Jump OTR/L Pager [phone] Office [phone]

## 2013-09-12 NOTE — Discharge Instructions (Signed)
Wound Care °Leave incision open to air. °You may shower. °Do not scrub directly on incision.  °Do not put any creams, lotions, or ointments on incision. °Activity °Walk each and every day, increasing distance each day. °No lifting greater than 5 lbs.  Avoid bending, arching, and twisting. °No driving for 2 weeks; may ride as a passenger locally. °If provided with back brace, wear when out of bed.  It is not necessary to wear in bed. °Diet °Resume your normal diet.  °Return to Work °Will be discussed at you follow up appointment. °Call Your Doctor If Any of These Occur °Redness, drainage, or swelling at the wound.  °Temperature greater than 101 degrees. °Severe pain not relieved by pain medication. °Incision starts to come apart. °Follow Up Appt °Call today for appointment in 3-4 weeks ([phone]) or for problems.  If you have any hardware placed in your spine, you will need an x-ray before your appointment. ° ° ° °Microdiskectomy ° Your spine is made up of bones called vertebrae. Oval-shaped disks filled with a thick liquid sit between the vertebrae. The disks function like cushions and keep the bones from rubbing together. However, an injury or normal aging can cause a disk to bulge out (herniated disk). The herniated disk can press on a nerve root, which is painful. The pain may be in the lower back, or it might shoot down a leg. There also may be tingling or numbness. One way to stop the pain is with a minimally invasive surgery called microdiskectomy. The part of the disk that sticks out from the spine is removed. Only a small surgical cut (incision) is made in the back. Microdiskectomy is easier on the muscles and other tissues than traditional surgery.  °LET YOUR CAREGIVER KNOW ABOUT: °· Any allergies. °· The last time you had anything to eat or drink. This includes water, gum, and candy. °· All medicines you are taking, including herbs, eyedrops, over-the-counter medicines and creams. °· Blood thinners  (anticoagulants), aspirin, or other drugs that could affect blood clotting. °· Any history of blood clots. °· Any history of bleeding or other blood problems. °· Use of steroids (by mouth or as creams). °· Previous problems with anesthesia. °· Family history of anesthetic complications. °· Possibility of pregnancy, if this applies. °· Previous surgery. °· Smoking history. °· Any recent symptoms of colds or infections. °· Other health problems. °RISKS AND COMPLICATIONS  °· Leaking of spinal fluid. If this happens, you will need to stay in bed for a few days. °· Bleeding. °· Pain. °· Infection near the incision. °· Nerve damage. °· Blood clot in a leg. The clot can move to the lungs. This can be very serious. °· A herniated disk might come back (recur) and require additional surgery. °BEFORE THE PROCEDURE  °· A medical evaluation will be done. This may include: °¨ A physical exam. °¨ Blood tests. °¨ A test that checks heart rhythm (electrocardiography). °¨ Imaging tests such as a myelogram, chest X-ray, or MRI. °· Ask your caregiver about changing or stopping your regular medicines. °¨ You may need to stop using aspirin and nonsteroidal anti-inflammatory drugs (NSAIDs). This includes prescription drugs and over-the-counter drugs. If possible, do this 10 to 14 days before the procedure or as directed. You may also need to stop taking vitamin E. °¨ If you take blood thinners, ask your caregiver when you should stop taking them. °· You may need a stool-softening medicine a few days before the procedure. °· Stop smoking   2 weeks before the procedure if you smoke. Smoking can slow down the healing process. °· The day before the procedure, eat only a light dinner. Do not eat or drink anything for at least 8 hours before the procedure. Ask if it is okay to take any needed medicines with a sip of water. °· You may talk with the person who will be in charge of the anesthetic medicine during the procedure. °· Arrive at least 1  to 2 hours before the procedure or as directed. °PROCEDURE °· Small monitors will be put on your body. They are used to check your heart, blood pressure, and oxygen level. °· An intravenous (IV) access tube will be inserted in your vein. Medicine will be able to flow directly into your body through the IV. °· You might be given a medicine to help you relax (sedative). °· You will be given a medicine that makes you sleep (general anesthetic). °· Your back will be cleaned with a special solution to kill germs on the skin. °· Once you are asleep, the surgeon will make a small incision in the middle of your lower back. It is often just 1 to 2 inches long. °· Muscles in the back are not cut. They are moved to the side. °· Often, a small piece of bone needs to be removed. This creates a better "window" so the surgeon can see the herniated disk. °· The surgeon uses a microscope to check the disk and nerves near it. °· The part of the disk that is causing problems is removed. °· The area under the skin is closed with stitches. In time, these will go away on their own. °· The skin is closed with small stitches (sutures) or staples. °· A small bandage (dressing) is put over the incision. °· The procedure usually takes about 1 hour. °AFTER THE PROCEDURE  °· You will stay in a recovery area until the anesthesia has worn off. Your blood pressure and pulse will be checked every so often. °· Some pain is normal after this surgery. You will probably be given pain medicine while in the recovery area. °· You may be able to go home the same day as the surgery. Once you get up and start walking, you will be able to go home. Sometimes, an overnight stay is needed. °Document Released: 12/14/2008 Document Revised: 03/20/2011 Document Reviewed: 12/14/2008 °[employer]® Patient Information ©2015 [employer]. This information is not intended to replace advice given to you by your health care provider. Make sure you discuss any questions you  have with your health care provider. ° °

## 2013-09-12 NOTE — Progress Notes (Signed)
Pt and son given D/C instructions with Rx's, verbal understanding was provided. Pt's IV was removed prior to D/C. Pt's incision was clean, dry, and had no sign of infection. Pt D/C'd home via wheelchair @ 1225 per MD order. Pt is stable @ D/C and has no other needs at this time. Holli Humbles, RN

## 2015-07-29 ENCOUNTER — Emergency Department (HOSPITAL_COMMUNITY)
Admission: EM | Admit: 2015-07-29 | Discharge: 2015-07-29 | Disposition: A | Discharge status: Home / Self Care | Payer: Medicare Other | Attending: Emergency Medicine | Admitting: Emergency Medicine

## 2015-07-29 ENCOUNTER — Emergency Department (HOSPITAL_COMMUNITY): Payer: Medicare Other

## 2015-07-29 ENCOUNTER — Encounter (HOSPITAL_COMMUNITY): Payer: Self-pay | Admitting: Emergency Medicine

## 2015-07-29 DIAGNOSIS — Z79899 Other long term (current) drug therapy: Secondary | ICD-10-CM | POA: Diagnosis not present

## 2015-07-29 DIAGNOSIS — N5089 Other specified disorders of the male genital organs: Secondary | ICD-10-CM

## 2015-07-29 DIAGNOSIS — Z91040 Latex allergy status: Secondary | ICD-10-CM | POA: Insufficient documentation

## 2015-07-29 DIAGNOSIS — Z7984 Long term (current) use of oral hypoglycemic drugs: Secondary | ICD-10-CM | POA: Diagnosis not present

## 2015-07-29 DIAGNOSIS — Z85840 Personal history of malignant neoplasm of eye: Secondary | ICD-10-CM | POA: Insufficient documentation

## 2015-07-29 DIAGNOSIS — R339 Retention of urine, unspecified: Secondary | ICD-10-CM | POA: Diagnosis present

## 2015-07-29 DIAGNOSIS — N451 Epididymitis: Secondary | ICD-10-CM | POA: Diagnosis not present

## 2015-07-29 DIAGNOSIS — Z96652 Presence of left artificial knee joint: Secondary | ICD-10-CM | POA: Diagnosis not present

## 2015-07-29 DIAGNOSIS — E119 Type 2 diabetes mellitus without complications: Secondary | ICD-10-CM | POA: Insufficient documentation

## 2015-07-29 LAB — BASIC METABOLIC PANEL
ANION GAP: 8 (ref 5–15)
BUN: 11 mg/dL (ref 6–20)
CALCIUM: 10.1 mg/dL (ref 8.9–10.3)
CHLORIDE: 102 mmol/L (ref 101–111)
CO2: 26 mmol/L (ref 22–32)
Creatinine, Ser: 1.17 mg/dL (ref 0.61–1.24)
GFR calc non Af Amer: >60 mL/min (ref >60)
GLUCOSE: 151 mg/dL — AB (ref 65–99)
POTASSIUM: 3.7 mmol/L (ref 3.5–5.1)
Sodium: 136 mmol/L (ref 135–145)

## 2015-07-29 LAB — CBC WITH DIFFERENTIAL/PLATELET
BASOS ABS: 0 10*3/uL (ref 0.0–0.1)
BASOS PCT: 0 %
Eosinophils Absolute: 0 10*3/uL (ref 0.0–0.7)
Eosinophils Relative: 0 %
HEMATOCRIT: 43.6 % (ref 39.0–52.0)
HEMOGLOBIN: 13.7 g/dL (ref 13.0–17.0)
LYMPHS PCT: 11 %
Lymphs Abs: 1.5 10*3/uL (ref 0.7–4.0)
MCH: 28.4 pg (ref 26.0–34.0)
MCHC: 31.4 g/dL (ref 30.0–36.0)
MCV: 90.3 fL (ref 78.0–100.0)
MONO ABS: 1.4 10*3/uL — AB (ref 0.1–1.0)
Monocytes Relative: 10 %
NEUTROS ABS: 11.1 10*3/uL — AB (ref 1.7–7.7)
NEUTROS PCT: 79 %
Platelets: 231 10*3/uL (ref 150–400)
RBC: 4.83 MIL/uL (ref 4.22–5.81)
RDW: 14.3 % (ref 11.5–15.5)
WBC: 14.2 10*3/uL — ABNORMAL HIGH (ref 4.0–10.5)

## 2015-07-29 LAB — URINE MICROSCOPIC-ADD ON

## 2015-07-29 LAB — URINALYSIS, ROUTINE W REFLEX MICROSCOPIC
Glucose, UA: 250 mg/dL — AB
KETONES UR: 15 mg/dL — AB
Nitrite: POSITIVE — AB
PROTEIN: 100 mg/dL — AB
Specific Gravity, Urine: 1.033 — ABNORMAL HIGH (ref 1.005–1.030)
pH: 5 (ref 5.0–8.0)

## 2015-07-29 MED ORDER — CIPROFLOXACIN HCL 500 MG PO TABS
500.0000 mg | ORAL_TABLET | Freq: Once | ORAL | Status: AC
  Administered 2015-07-29: 500 mg via ORAL
  Filled 2015-07-29: qty 1

## 2015-07-29 MED ORDER — CIPROFLOXACIN HCL 500 MG PO TABS: 500.0000 mg | ORAL_TABLET | Freq: Once | ORAL | Status: DC

## 2015-07-29 MED ORDER — HYDROCODONE-ACETAMINOPHEN 5-325 MG PO TABS: 2.0000 | ORAL_TABLET | ORAL | Status: DC | PRN

## 2015-07-29 MED ORDER — MORPHINE SULFATE (PF) 4 MG/ML IV SOLN
4.0000 mg | INTRAVENOUS | Status: DC | PRN
  Administered 2015-07-29: 4 mg via INTRAVENOUS
  Filled 2015-07-29: qty 1

## 2015-07-29 MED ORDER — CIPROFLOXACIN HCL 500 MG PO TABS: 500.0000 mg | ORAL_TABLET | Freq: Two times a day (BID) | ORAL | Status: DC

## 2015-07-29 MED ORDER — ONDANSETRON HCL 4 MG/2ML IJ SOLN
4.0000 mg | Freq: Once | INTRAMUSCULAR | Status: AC
  Administered 2015-07-29: 4 mg via INTRAVENOUS
  Filled 2015-07-29: qty 2

## 2015-07-29 NOTE — Discharge Instructions (Signed)
Epididymitis °Epididymitis is swelling (inflammation) of the epididymis. The epididymis is a cord-like structure that is located along the top and back part of the testicle. It collects and stores sperm from the testicle. °This condition can also cause pain and swelling of the testicle and scrotum. Symptoms usually start suddenly (acute epididymitis). Sometimes epididymitis starts gradually and lasts for a while (chronic epididymitis). This type may be harder to treat. °CAUSES °In men 35 and younger, this condition is usually caused by a bacterial infection or sexually transmitted disease (STD), such as: °· Gonorrhea. °· Chlamydia.   °In men 35 and older who do not have anal sex, this condition is usually caused by bacteria from a blockage or abnormalities in the urinary system. These can result from: °· Having a tube placed into the bladder (urinary catheter). °· Having an enlarged or inflamed prostate gland. °· Having recent urinary tract surgery. °In men who have a condition that weakens the body's defense system (immune system), such as HIV, this condition can be caused by:  °· Other bacteria, including tuberculosis and syphilis. °· Viruses. °· Fungi. °Sometimes this condition occurs without infection. That may happen if urine flows backward into the epididymis after heavy lifting or straining. °RISK FACTORS °This condition is more likely to develop in men: °· Who have unprotected sex with more than one partner. °· Who have anal sex.   °· Who have recently had surgery.   °· Who have a urinary catheter. °· Who have urinary problems. °· Who have a suppressed immune system.   °SYMPTOMS  °This condition usually begins suddenly with chills, fever, and pain behind the scrotum and in the testicle. Other symptoms include:  °· Swelling of the scrotum, testicle, or both. °· Pain when ejaculating or urinating. °· Pain in the back or belly. °· Nausea. °· Itching and discharge from the penis. °· Frequent need to pass  urine. °· Redness and tenderness of the scrotum. °DIAGNOSIS °Your health care provider can diagnose this condition based on your symptoms and medical history. Your health care provider will also do a physical exam to ask about your symptoms and check your scrotum and testicle for swelling, pain, and redness. You may also have other tests, including:   °· Examination of discharge from the penis. °· Urine tests for infections, such as STDs.   °Your health care provider may test you for other STDs, including HIV.  °TREATMENT °Treatment for this condition depends on the cause. If your condition is caused by a bacterial infection, oral antibiotic medicine may be prescribed. If the bacterial infection has spread to your blood, you may need to receive IV antibiotics. Nonbacterial epididymitis is treated with home care that includes bed rest and elevation of the scrotum. °Surgery may be needed to treat: °· Bacterial epididymitis that causes pus to build up in the scrotum (abscess). °· Chronic epididymitis that has not responded to other treatments. °HOME CARE INSTRUCTIONS °Medicines  °· Take over-the-counter and prescription medicines only as told by your health care provider.   °· If you were prescribed an antibiotic medicine, take it as told by your health care provider. Do not stop taking the antibiotic even if your condition improves. °Sexual Activity  °· If your epididymitis was caused by an STD, avoid sexual activity until your treatment is complete. °· Inform your sexual partner or partners if you test positive for an STD. They may need to be treated. Do not engage in sexual activity with your partner or partners until their treatment is completed. °General Instructions  °· Return to your normal activities as told   by your health care provider. Ask your health care provider what activities are safe for you. °· Keep your scrotum elevated and supported while resting. Ask your health care provider if you should wear a  scrotal support, such as a jockstrap. Wear it as told by your health care provider. °· If directed, apply ice to the affected area:   °¨ Put ice in a plastic bag. °¨ Place a towel between your skin and the bag. °¨ Leave the ice on for 20 minutes, 2-3 times per day. °· Try taking a sitz bath to help with discomfort. This is a warm water bath that is taken while you are sitting down. The water should only come up to your hips and should cover your buttocks. Do this 3-4 times per day or as told by your health care provider. °· Keep all follow-up visits as told by your health care provider. This is important. °SEEK MEDICAL CARE IF:  °· You have a fever.   °· Your pain medicine is not helping.   °· Your pain is getting worse.   °· Your symptoms do not improve within three days. °  °This information is not intended to replace advice given to you by your health care provider. Make sure you discuss any questions you have with your health care provider. °  °Document Released: 12/24/1999 Document Revised: 09/16/2014 Document Reviewed: 05/13/2014 °[employer] Interactive Patient Education ©2016 [employer] ° °

## 2015-07-29 NOTE — ED Provider Notes (Signed)
CSN: [id number]     Arrival date & time 07/29/15  1258 History   First MD Initiated Contact with Patient 07/29/15 1601     Chief Complaint  Patient presents with  . Urinary Retention     HPI  She presents for evaluation of testicular pain. Patient reports left testicle pain. Hurts to walk be upright. States it hurts to urinate and has for about the last week. Swelling and pain in his left S over last 2 days. No trauma. No hematuria. Slow in onset 2 nights ago worsening yesterday morning. No sudden onset of pain. No past similar episodes.  Past Medical History  Diagnosis Date  . Arthritis   . Cancer (HCC)     L EYE - MELANOMA  . GERD (gastroesophageal reflux disease)   . Diabetes mellitus     fasting cbg 90-100   Past Surgical History  Procedure Laterality Date  . Knee arthroscopy  08/2010  . Enucleation  25 YRS AGO     LEFT EYE DUE TO CANCER  . Eye surgery      RETINA SURG RT EYE  . 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 Total knee arthroplasty  04/26/2011    Procedure: TOTAL KNEE ARTHROPLASTY;  Surgeon: Tobi Bastos, MD;  Location: [facility];  Service: Orthopedics;  Laterality: Left;  . I&d extremity  11/18/2011    Procedure: IRRIGATION AND DEBRIDEMENT EXTREMITY;  Surgeon: Tobi Bastos, MD;  Location: [facility];  Service: Orthopedics;  Laterality: Left;  I & D Left Thigh (left knee and inner thigh)  . Lumbar laminectomy/decompression microdiscectomy Left 09/11/2013    Procedure: Left Lumbar Four-Five  Microdiskectomy;  Surgeon: Erline Levine, MD;  Location: [facility];  Service: Neurosurgery;  Laterality: Left;  Left  Lumbar Four-Five  Microdiskectomy   History reviewed. No pertinent family history. Social History  Substance Use Topics  . Smoking status: Never Smoker   . Smokeless tobacco: Current User    Types: Chew  . Alcohol Use: Yes     Comment: OCCASIONAL    Review of Systems  Constitutional: Negative for fever, chills, diaphoresis, appetite change and  fatigue.  HENT: Negative for mouth sores, sore throat and trouble swallowing.   Eyes: Negative for visual disturbance.  Respiratory: Negative for cough, chest tightness, shortness of breath and wheezing.   Cardiovascular: Negative for chest pain.  Gastrointestinal: Negative for nausea, vomiting, abdominal pain, diarrhea and abdominal distention.  Endocrine: Negative for polydipsia, polyphagia and polyuria.  Genitourinary: Positive for dysuria, frequency, scrotal swelling and testicular pain. Negative for hematuria.  Musculoskeletal: Negative for gait problem.  Skin: Negative for color change, pallor and rash.  Neurological: Negative for dizziness, syncope, light-headedness and headaches.  Hematological: Does not bruise/bleed easily.  Psychiatric/Behavioral: Negative for behavioral problems and confusion.      Allergies  Latex; Iohexol; Sulfa antibiotics; and Penicillins  Home Medications   Prior to Admission medications   Medication Sig Start Date End Date Taking? Authorizing Provider  ALPRAZolam Duanne Moron) 1 MG tablet Take 1 mg by mouth 2 (two) times daily as needed. Anxiety   Yes Historical Provider, MD  amoxicillin (AMOXIL) 500 MG capsule Take 1,000 mg by mouth 2 (two) times daily.  07/08/15  Yes Historical Provider, MD  brimonidine (ALPHAGAN) 0.2 % ophthalmic solution Place 1 drop into the right eye 2 (two) times daily.   Yes Historical Provider, MD  Cyanocobalamin (VITAMIN B12 PO) Take 1 tablet by mouth daily.  Yes Historical Provider, MD  HYDROcodone-acetaminophen (NORCO) 10-325 MG per tablet Take 1 tablet by mouth every 4 (four) hours as needed for moderate pain.   Yes Historical Provider, MD  meloxicam (MOBIC) 15 MG tablet Take 15 mg by mouth daily.   Yes Historical Provider, MD  metFORMIN (GLUCOPHAGE) 500 MG tablet Take 500 mg by mouth 2 (two) times daily with a meal.   Yes Historical Provider, MD  omeprazole (PRILOSEC) 20 MG capsule Take 1 capsule by mouth 2 (two) times daily.  07/12/15  Yes Historical Provider, MD  ranitidine (ZANTAC) 150 MG tablet Take 150 mg by mouth 2 (two) times daily.   Yes Historical Provider, MD  tamsulosin (FLOMAX) 0.4 MG CAPS capsule Take 1 capsule by mouth daily. 06/08/15  Yes Historical Provider, MD  clarithromycin (BIAXIN) 500 MG tablet Take 1 tablet by mouth 2 (two) times daily. Reported on 07/29/2015 07/08/15   Historical Provider, MD   BP 120/75 mmHg  Pulse 84  Temp(Src) 100.6 F (38.1 C) (Oral)  Resp 18  SpO2 96% Physical Exam  Constitutional: He is oriented to person, place, and time. He appears well-developed and well-nourished. No distress.  HENT:  Head: Normocephalic.  Eyes: Conjunctivae are normal. Pupils are equal, round, and reactive to light. No scleral icterus.  Neck: Normal range of motion. Neck supple. No thyromegaly present.  Cardiovascular: Normal rate and regular rhythm.  Exam reveals no gallop and no friction rub.   No murmur heard. Pulmonary/Chest: Effort normal and breath sounds normal. No respiratory distress. He has no wheezes. He has no rales.  Abdominal: Soft. Bowel sounds are normal. He exhibits no distension. There is no tenderness. There is no rebound.  Genitourinary:     Musculoskeletal: Normal range of motion.  Neurological: He is alert and oriented to person, place, and time.  Skin: Skin is warm and dry. No rash noted.  Psychiatric: He has a normal mood and affect. His behavior is normal.    ED Course  Procedures (including critical care time) Labs Review Labs Reviewed  URINALYSIS, ROUTINE W REFLEX MICROSCOPIC (NOT AT [facility]) - Abnormal; Notable for the following:    Color, Urine ORANGE (*)    APPearance CLOUDY (*)    Specific Gravity, Urine 1.033 (*)    Glucose, UA 250 (*)    Hgb urine dipstick MODERATE (*)    Bilirubin Urine SMALL (*)    Ketones, ur 15 (*)    Protein, ur 100 (*)    Nitrite POSITIVE (*)    Leukocytes, UA SMALL (*)    All other components within normal limits  URINE  MICROSCOPIC-ADD ON - Abnormal; Notable for the following:    Squamous Epithelial / LPF 0-5 (*)    Bacteria, UA MANY (*)    Casts HYALINE CASTS (*)    All other components within normal limits  CBC WITH DIFFERENTIAL/PLATELET - Abnormal; Notable for the following:    WBC 14.2 (*)    Neutro Abs 11.1 (*)    Monocytes Absolute 1.4 (*)    All other components within normal limits  BASIC METABOLIC PANEL - Abnormal; Notable for the following:    Glucose, Bld 151 (*)    All other components within normal limits  URINE CULTURE    Imaging Review US Scrotum  07/29/2015  CLINICAL DATA:  Scrotal swelling and pain for 3 days, worse on the left EXAM: SCROTAL ULTRASOUND DOPPLER ULTRASOUND OF THE TESTICLES TECHNIQUE: Complete ultrasound examination of the testicles, epididymis, and other scrotal structures was  performed. Color and spectral Doppler ultrasound were also utilized to evaluate blood flow to the testicles. COMPARISON:  None. FINDINGS: Right testicle Measurements: 4.0 x 2.0 x 2.6 cm. No mass or microlithiasis visualized. Left testicle Measurements: 3.7 x 3.0 x 3.0 cm. No mass or microlithiasis visualized. Right epididymis:  Normal in size and appearance. Left epididymis: Heterogeneous, mildly enlarged with increased echogenicity and hypervascularity, compatible with epididymitis. Hydrocele:  Trace simple left hydrocele. Varicocele: Small right varicocele also noted, more pronounced with Valsalva. Pulsed Doppler interrogation of both testes demonstrates normal low resistance arterial and venous waveforms bilaterally. IMPRESSION: Ultrasound findings compatible with acute left epididymitis. Probable small reactive left hydrocele Incidental small right varicocele No acute testicular abnormality or torsion. Electronically Signed   By: Jerilynn Mages.  Shick M.D.   On: 07/29/2015 17:45   [place] Art/ven Flow Abd Pelv Doppler  07/29/2015  CLINICAL DATA:  Scrotal swelling and pain for 3 days, worse on the left EXAM: SCROTAL  ULTRASOUND DOPPLER ULTRASOUND OF THE TESTICLES TECHNIQUE: Complete ultrasound examination of the testicles, epididymis, and other scrotal structures was performed. Color and spectral Doppler ultrasound were also utilized to evaluate blood flow to the testicles. COMPARISON:  None. FINDINGS: Right testicle Measurements: 4.0 x 2.0 x 2.6 cm. No mass or microlithiasis visualized. Left testicle Measurements: 3.7 x 3.0 x 3.0 cm. No mass or microlithiasis visualized. Right epididymis:  Normal in size and appearance. Left epididymis: Heterogeneous, mildly enlarged with increased echogenicity and hypervascularity, compatible with epididymitis. Hydrocele:  Trace simple left hydrocele. Varicocele: Small right varicocele also noted, more pronounced with Valsalva. Pulsed Doppler interrogation of both testes demonstrates normal low resistance arterial and venous waveforms bilaterally. IMPRESSION: Ultrasound findings compatible with acute left epididymitis. Probable small reactive left hydrocele Incidental small right varicocele No acute testicular abnormality or torsion. Electronically Signed   By: Jerilynn Mages.  Shick M.D.   On: 07/29/2015 17:45   I have personally reviewed and evaluated these images and lab results as part of my medical decision-making.   EKG Interpretation None      MDM   Final diagnoses:  Epididymitis    Ultrasound shows normal testicular flow. Other findings consistent with epididymitis.  Here in show signs of infection also consistent with epididymitis. Your culture requested. Given by mouth Cipro. He is allergic to Bactrim. I warned him to take a probiotic while taking the Cipro. Also if he develops joint pain or diarrhea to stop.   Wear  supportive shorts and primary care follow-up.    Tanna Furry, MD 07/29/15 [phone]

## 2015-07-29 NOTE — ED Notes (Signed)
Pt ambulates independently and with steady gait at time of discharge. Discharge instructions and follow up information reviewed with patient. No other questions or concerns voiced at this time.  

## 2015-07-29 NOTE — ED Notes (Signed)
Patient has a 100.6 degree fever. Informed Melissa I. - RN.

## 2015-07-29 NOTE — ED Notes (Signed)
Pt sts scrotal swelling and urinary retention only passing small amounts of urine x 3 days

## 2015-07-29 NOTE — ED Notes (Signed)
Patient transported to Ultrasound 

## 2015-08-01 LAB — URINE CULTURE: SPECIAL REQUESTS: NORMAL

## 2015-08-02 ENCOUNTER — Telehealth (HOSPITAL_BASED_OUTPATIENT_CLINIC_OR_DEPARTMENT_OTHER): Payer: Self-pay | Admitting: Emergency Medicine

## 2015-08-02 NOTE — Telephone Encounter (Signed)
Post ED Visit - Positive Culture Follow-up: Successful Patient Follow-Up  Culture assessed and recommendations reviewed by: []  Elenor Quinones, Pharm.D. []  Heide Guile, Pharm.D., BCPS []  Parks Neptune, Pharm.D. []  Alycia Rossetti, Pharm.D., BCPS []  [place], Pharm.D., BCPS, AAHIVP [x]  Legrand Como, Pharm.D., BCPS, AAHIVP []  Milus Glazier, Pharm.D. []  Stephens November, Pharm.D.  Positive urine culture  []  Patient discharged without antimicrobial prescription and treatment is now indicated [x]  Organism is resistant to prescribed ED discharge antimicrobial []  Patient with positive blood cultures  Changes discussed with ED provider:Will Dansie PA  Needs to return to ED for admission for IV antibiotics  08/02/15 1510   [name] 08/02/2015, 3:17 PM

## 2015-08-02 NOTE — Progress Notes (Signed)
ED Antimicrobial Stewardship Positive Culture Follow Up   [name] is an 69 y.o. male who presented to [facility] on 07/29/2015 with a chief complaint of  Chief Complaint  Patient presents with  . Urinary Retention    Recent Results (from the past 720 hour(s))  Urine culture     Status: Abnormal   Collection Time: 07/29/15  1:10 PM  Result Value Ref Range Status   Specimen Description URINE, RANDOM  Final   Special Requests none Normal  Final   Culture (A)  Final    >=100,000 COLONIES/mL ESCHERICHIA COLI Confirmed Extended Spectrum Beta-Lactamase Producer (ESBL)    Report Status 08/01/2015 FINAL  Final   Organism ID, Bacteria ESCHERICHIA COLI (A)  Final      Susceptibility   Escherichia coli - MIC*    AMPICILLIN >=32 RESISTANT Resistant     CEFAZOLIN >=64 RESISTANT Resistant     CEFTRIAXONE >=64 RESISTANT Resistant     CIPROFLOXACIN >=4 RESISTANT Resistant     GENTAMICIN <=1 SENSITIVE Sensitive     IMIPENEM <=0.25 SENSITIVE Sensitive     NITROFURANTOIN <=16 SENSITIVE Sensitive     TRIMETH/SULFA >=320 RESISTANT Resistant     AMPICILLIN/SULBACTAM 16 INTERMEDIATE Intermediate     PIP/TAZO <=4 SENSITIVE Sensitive     * >=100,000 COLONIES/mL ESCHERICHIA COLI    [x]  Treated with Cipro, organism resistant to prescribed antimicrobial []  Patient discharged originally without antimicrobial agent and treatment is now indicated  Based on patient's allergies, diagnosis (epididymitis), and multidrug resistant organism would recommend admission for IV antibiotics.  He needs to return to the ED for evaluation.  Would recommend Ertapenem 1g IV q24h in this case (cross reactivity is 1-10% with penicillin allergy).  ED Provider: Sula Rumple, PA-C   [name] 08/02/2015, 9:59 AM Infectious Diseases Pharmacist Phone# [phone]

## 2015-08-03 ENCOUNTER — Emergency Department (HOSPITAL_COMMUNITY): Payer: Medicare Other

## 2015-08-03 ENCOUNTER — Inpatient Hospital Stay (HOSPITAL_COMMUNITY)
Admission: EM | Admit: 2015-08-03 | Discharge: 2015-08-06 | DRG: 728 | Disposition: A | Discharge status: Home Health | Payer: Medicare Other | Attending: Internal Medicine | Admitting: Internal Medicine

## 2015-08-03 ENCOUNTER — Encounter (HOSPITAL_COMMUNITY): Payer: Self-pay

## 2015-08-03 DIAGNOSIS — Z720 Tobacco use: Secondary | ICD-10-CM | POA: Diagnosis not present

## 2015-08-03 DIAGNOSIS — N451 Epididymitis: Principal | ICD-10-CM | POA: Diagnosis present

## 2015-08-03 DIAGNOSIS — I959 Hypotension, unspecified: Secondary | ICD-10-CM | POA: Diagnosis present

## 2015-08-03 DIAGNOSIS — K219 Gastro-esophageal reflux disease without esophagitis: Secondary | ICD-10-CM | POA: Diagnosis present

## 2015-08-03 DIAGNOSIS — N5089 Other specified disorders of the male genital organs: Secondary | ICD-10-CM | POA: Diagnosis present

## 2015-08-03 DIAGNOSIS — Z7984 Long term (current) use of oral hypoglycemic drugs: Secondary | ICD-10-CM | POA: Diagnosis not present

## 2015-08-03 DIAGNOSIS — C9000 Multiple myeloma not having achieved remission: Secondary | ICD-10-CM | POA: Diagnosis present

## 2015-08-03 DIAGNOSIS — Z96652 Presence of left artificial knee joint: Secondary | ICD-10-CM | POA: Diagnosis present

## 2015-08-03 DIAGNOSIS — N400 Enlarged prostate without lower urinary tract symptoms: Secondary | ICD-10-CM | POA: Diagnosis present

## 2015-08-03 DIAGNOSIS — E119 Type 2 diabetes mellitus without complications: Secondary | ICD-10-CM | POA: Diagnosis present

## 2015-08-03 DIAGNOSIS — F419 Anxiety disorder, unspecified: Secondary | ICD-10-CM | POA: Diagnosis present

## 2015-08-03 DIAGNOSIS — M179 Osteoarthritis of knee, unspecified: Secondary | ICD-10-CM | POA: Diagnosis present

## 2015-08-03 DIAGNOSIS — N179 Acute kidney failure, unspecified: Secondary | ICD-10-CM | POA: Diagnosis present

## 2015-08-03 DIAGNOSIS — Z1624 Resistance to multiple antibiotics: Secondary | ICD-10-CM | POA: Diagnosis present

## 2015-08-03 DIAGNOSIS — Z1612 Extended spectrum beta lactamase (ESBL) resistance: Secondary | ICD-10-CM | POA: Diagnosis present

## 2015-08-03 DIAGNOSIS — E876 Hypokalemia: Secondary | ICD-10-CM | POA: Diagnosis present

## 2015-08-03 DIAGNOSIS — N50812 Left testicular pain: Secondary | ICD-10-CM

## 2015-08-03 DIAGNOSIS — N433 Hydrocele, unspecified: Secondary | ICD-10-CM | POA: Diagnosis present

## 2015-08-03 DIAGNOSIS — Z79899 Other long term (current) drug therapy: Secondary | ICD-10-CM

## 2015-08-03 DIAGNOSIS — N390 Urinary tract infection, site not specified: Secondary | ICD-10-CM | POA: Diagnosis present

## 2015-08-03 DIAGNOSIS — B9620 Unspecified Escherichia coli [E. coli] as the cause of diseases classified elsewhere: Secondary | ICD-10-CM | POA: Diagnosis present

## 2015-08-03 DIAGNOSIS — M1710 Unilateral primary osteoarthritis, unspecified knee: Secondary | ICD-10-CM | POA: Diagnosis present

## 2015-08-03 DIAGNOSIS — R109 Unspecified abdominal pain: Secondary | ICD-10-CM

## 2015-08-03 LAB — GLUCOSE, CAPILLARY: Glucose-Capillary: 155 mg/dL — ABNORMAL HIGH (ref 65–99)

## 2015-08-03 LAB — CBC WITH DIFFERENTIAL/PLATELET
BASOS ABS: 0.1 10*3/uL (ref 0.0–0.1)
Basophils Relative: 1 %
EOS PCT: 1 %
Eosinophils Absolute: 0.1 10*3/uL (ref 0.0–0.7)
HEMATOCRIT: 43.4 % (ref 39.0–52.0)
HEMOGLOBIN: 13.9 g/dL (ref 13.0–17.0)
LYMPHS ABS: 1.7 10*3/uL (ref 0.7–4.0)
LYMPHS PCT: 22 %
MCH: 28.1 pg (ref 26.0–34.0)
MCHC: 32 g/dL (ref 30.0–36.0)
MCV: 87.7 fL (ref 78.0–100.0)
MONOS PCT: 14 %
Monocytes Absolute: 1.1 10*3/uL — ABNORMAL HIGH (ref 0.1–1.0)
Neutro Abs: 4.6 10*3/uL (ref 1.7–7.7)
Neutrophils Relative %: 62 %
Platelets: 256 10*3/uL (ref 150–400)
RBC: 4.95 MIL/uL (ref 4.22–5.81)
RDW: 14.5 % (ref 11.5–15.5)
WBC: 7.6 10*3/uL (ref 4.0–10.5)

## 2015-08-03 LAB — URINALYSIS, ROUTINE W REFLEX MICROSCOPIC
BILIRUBIN URINE: NEGATIVE
GLUCOSE, UA: 250 mg/dL — AB
KETONES UR: NEGATIVE mg/dL
Leukocytes, UA: NEGATIVE
Nitrite: POSITIVE — AB
PROTEIN: NEGATIVE mg/dL
Specific Gravity, Urine: 1.034 — ABNORMAL HIGH (ref 1.005–1.030)
pH: 5.5 (ref 5.0–8.0)

## 2015-08-03 LAB — COMPREHENSIVE METABOLIC PANEL
ALBUMIN: 3.7 g/dL (ref 3.5–5.0)
ALK PHOS: 57 U/L (ref 38–126)
ALT: 22 U/L (ref 17–63)
AST: 23 U/L (ref 15–41)
Anion gap: 9 (ref 5–15)
BILIRUBIN TOTAL: 0.6 mg/dL (ref 0.3–1.2)
BUN: 16 mg/dL (ref 6–20)
CALCIUM: 10 mg/dL (ref 8.9–10.3)
CO2: 25 mmol/L (ref 22–32)
CREATININE: 1.26 mg/dL — AB (ref 0.61–1.24)
Chloride: 103 mmol/L (ref 101–111)
GFR calc Af Amer: >60 mL/min (ref >60)
GFR, EST NON AFRICAN AMERICAN: 56 mL/min — AB (ref >60)
GLUCOSE: 202 mg/dL — AB (ref 65–99)
Potassium: 3.3 mmol/L — ABNORMAL LOW (ref 3.5–5.1)
Sodium: 137 mmol/L (ref 135–145)
TOTAL PROTEIN: 7.2 g/dL (ref 6.5–8.1)

## 2015-08-03 LAB — I-STAT CG4 LACTIC ACID, ED
LACTIC ACID, VENOUS: 1.46 mmol/L (ref 0.5–1.9)
LACTIC ACID, VENOUS: 1 mmol/L (ref 0.5–1.9)

## 2015-08-03 LAB — URINE MICROSCOPIC-ADD ON

## 2015-08-03 LAB — MRSA PCR SCREENING: MRSA by PCR: NEGATIVE

## 2015-08-03 MED ORDER — HYDROMORPHONE HCL 1 MG/ML IJ SOLN
1.0000 mg | Freq: Once | INTRAMUSCULAR | Status: AC
  Administered 2015-08-03: 1 mg via INTRAVENOUS
  Filled 2015-08-03: qty 1

## 2015-08-03 MED ORDER — POTASSIUM CHLORIDE IN NACL 20-0.9 MEQ/L-% IV SOLN
INTRAVENOUS | Status: AC
  Administered 2015-08-03 – 2015-08-04 (×2): 1000 mL via INTRAVENOUS
  Filled 2015-08-03 (×2): qty 1000

## 2015-08-03 MED ORDER — MELOXICAM 7.5 MG PO TABS
15.0000 mg | ORAL_TABLET | Freq: Every day | ORAL | Status: DC
  Administered 2015-08-04 – 2015-08-06 (×3): 15 mg via ORAL
  Filled 2015-08-03 (×3): qty 2

## 2015-08-03 MED ORDER — KETOROLAC TROMETHAMINE 30 MG/ML IJ SOLN
30.0000 mg | Freq: Once | INTRAMUSCULAR | Status: AC
  Administered 2015-08-03: 30 mg via INTRAVENOUS
  Filled 2015-08-03: qty 1

## 2015-08-03 MED ORDER — METFORMIN HCL 500 MG PO TABS
500.0000 mg | ORAL_TABLET | Freq: Two times a day (BID) | ORAL | Status: DC
  Administered 2015-08-04: 500 mg via ORAL
  Filled 2015-08-03: qty 1

## 2015-08-03 MED ORDER — SODIUM CHLORIDE 0.9 % IV BOLUS (SEPSIS)
1000.0000 mL | Freq: Once | INTRAVENOUS | Status: AC
  Administered 2015-08-03: 1000 mL via INTRAVENOUS

## 2015-08-03 MED ORDER — PANTOPRAZOLE SODIUM 40 MG PO TBEC
40.0000 mg | DELAYED_RELEASE_TABLET | Freq: Two times a day (BID) | ORAL | Status: DC
  Administered 2015-08-03 – 2015-08-05 (×4): 40 mg via ORAL
  Filled 2015-08-03 (×4): qty 1

## 2015-08-03 MED ORDER — ALPRAZOLAM 0.5 MG PO TABS
1.0000 mg | ORAL_TABLET | Freq: Two times a day (BID) | ORAL | Status: DC | PRN
  Administered 2015-08-03 – 2015-08-05 (×3): 1 mg via ORAL
  Filled 2015-08-03 (×3): qty 2

## 2015-08-03 MED ORDER — ENOXAPARIN SODIUM 40 MG/0.4ML ~~LOC~~ SOLN
40.0000 mg | SUBCUTANEOUS | Status: DC
Start: 2015-08-03 — End: 2015-08-06
  Administered 2015-08-03 – 2015-08-05 (×3): 40 mg via SUBCUTANEOUS
  Filled 2015-08-03 (×3): qty 0.4

## 2015-08-03 MED ORDER — FAMOTIDINE 20 MG PO TABS
20.0000 mg | ORAL_TABLET | Freq: Two times a day (BID) | ORAL | Status: DC
  Administered 2015-08-03 – 2015-08-06 (×6): 20 mg via ORAL
  Filled 2015-08-03 (×6): qty 1

## 2015-08-03 MED ORDER — BRIMONIDINE TARTRATE 0.2 % OP SOLN
1.0000 [drp] | Freq: Two times a day (BID) | OPHTHALMIC | Status: DC
  Administered 2015-08-03 – 2015-08-06 (×6): 1 [drp] via OPHTHALMIC
  Filled 2015-08-03: qty 5

## 2015-08-03 MED ORDER — SODIUM CHLORIDE 0.9 % IV SOLN
500.0000 mg | Freq: Four times a day (QID) | INTRAVENOUS | Status: DC
  Administered 2015-08-03 – 2015-08-06 (×11): 500 mg via INTRAVENOUS
  Filled 2015-08-03 (×14): qty 500

## 2015-08-03 MED ORDER — ONDANSETRON HCL 4 MG/2ML IJ SOLN: 4.0000 mg | Freq: Four times a day (QID) | INTRAMUSCULAR | Status: DC | PRN

## 2015-08-03 MED ORDER — OXYCODONE HCL 5 MG PO TABS
10.0000 mg | ORAL_TABLET | ORAL | Status: DC | PRN
  Administered 2015-08-04 – 2015-08-06 (×5): 10 mg via ORAL
  Filled 2015-08-03 (×5): qty 2

## 2015-08-03 MED ORDER — TAMSULOSIN HCL 0.4 MG PO CAPS
0.4000 mg | ORAL_CAPSULE | Freq: Every day | ORAL | Status: DC
  Administered 2015-08-04 – 2015-08-06 (×3): 0.4 mg via ORAL
  Filled 2015-08-03 (×3): qty 1

## 2015-08-03 MED ORDER — ONDANSETRON HCL 4 MG PO TABS: 4.0000 mg | ORAL_TABLET | Freq: Four times a day (QID) | ORAL | Status: DC | PRN

## 2015-08-03 NOTE — ED Notes (Signed)
Attempted report x 2 

## 2015-08-03 NOTE — ED Provider Notes (Signed)
[facility] DEPT Provider Note   CSN: [id number] Arrival date & time: 08/03/15  1320  First Provider Contact:  First MD Initiated Contact with Patient 08/03/15 1615        History   Chief Complaint Chief Complaint  Patient presents with  . Follow-up    HPI JAHRON BIXLER is a 70 y.o. male.  Patient told to come in for positive urine cultures that were only sensitive to IV antibiotics. He was seen in the ED on July 20 with left testicular pain. He had an ultrasound that showed epididymitis and he was treated with Cipro. Urine culture grew ESBL with multidrug resistance. He states he has continued lower abdominal pain and left testicular pain which is slightly worse. He's had nausea and chills but no documented fever. Denies vomiting. Denies chest pain or shortness of breath. Denies any back pain. Denies any dysuria or hematuria.   The history is provided by the patient.    Past Medical History:  Diagnosis Date  . Arthritis   . Cancer (HCC)    L EYE - MELANOMA  . Diabetes mellitus    fasting cbg 90-100  . GERD (gastroesophageal reflux disease)     Patient Active Problem List   Diagnosis Date Noted  . Epididymitis 08/03/2015  . Type 2 diabetes mellitus ([place]) 08/03/2015  . Anxiety 08/03/2015  . BPH (benign prostatic hyperplasia) 08/03/2015  . Herniated lumbar disc without myelopathy 09/11/2013  . Abscess of left thigh 11/18/2011  . Acute blood loss anemia 04/28/2011  . Osteoarthritis, knee 04/26/2011    Past Surgical History:  Procedure Laterality Date  . ENUCLEATION  25 YRS AGO    LEFT EYE DUE TO CANCER  . EYE SURGERY     RETINA SURG RT EYE  . I&D EXTREMITY  11/18/2011   Procedure: IRRIGATION AND DEBRIDEMENT EXTREMITY;  Surgeon: Tobi Bastos, MD;  Location: [facility];  Service: Orthopedics;  Laterality: Left;  I & D Left Thigh (left knee and inner thigh)  . KNEE ARTHROSCOPY  08/2010  . LUMBAR LAMINECTOMY/DECOMPRESSION MICRODISCECTOMY Left 09/11/2013   Procedure: Left Lumbar Four-Five  Microdiskectomy;  Surgeon: Erline Levine, MD;  Location: [facility];  Service: Neurosurgery;  Laterality: Left;  Left  Lumbar Four-Five  Microdiskectomy  . TOTAL KNEE ARTHROPLASTY  04/26/2011   Procedure: TOTAL KNEE ARTHROPLASTY;  Surgeon: Tobi Bastos, MD;  Location: [facility];  Service: Orthopedics;  Laterality: Left;  . 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 Authorizing Provider  ALPRAZolam Duanne Moron) 1 MG tablet Take 1 mg by mouth 2 (two) times daily as needed. Anxiety    Historical Provider, MD  amoxicillin (AMOXIL) 500 MG capsule Take 1,000 mg by mouth 2 (two) times daily.  07/08/15   Historical Provider, MD  brimonidine (ALPHAGAN) 0.2 % ophthalmic solution Place 1 drop into the right eye 2 (two) times daily.    Historical Provider, MD  ciprofloxacin (CIPRO) 500 MG tablet Take 1 tablet (500 mg total) by mouth every 12 (twelve) hours. 07/29/15   Tanna Furry, MD  clarithromycin (BIAXIN) 500 MG tablet Take 1 tablet by mouth 2 (two) times daily. Reported on 07/29/2015 07/08/15   Historical Provider, MD  Cyanocobalamin (VITAMIN B12 PO) Take 1 tablet by mouth daily.    Historical Provider, MD  HYDROcodone-acetaminophen (NORCO/VICODIN) 5-325 MG tablet Take 2 tablets  by mouth every 4 (four) hours as needed. 07/29/15   Tanna Furry, MD  meloxicam (MOBIC) 15 MG tablet Take 15 mg by mouth daily.    Historical Provider, MD  metFORMIN (GLUCOPHAGE) 500 MG tablet Take 500 mg by mouth 2 (two) times daily with a meal.    Historical Provider, MD  omeprazole (PRILOSEC) 20 MG capsule Take 1 capsule by mouth 2 (two) times daily. 07/12/15   Historical Provider, MD  ranitidine (ZANTAC) 150 MG tablet Take 150 mg by mouth 2 (two) times daily.    Historical Provider, MD  tamsulosin (FLOMAX) 0.4 MG CAPS capsule Take 1 capsule by mouth daily. 06/08/15   Historical Provider, MD    Family  History History reviewed. No pertinent family history.  Social History Social History  Substance Use Topics  . Smoking status: Never Smoker  . Smokeless tobacco: Current User    Types: Chew  . Alcohol use Yes     Comment: OCCASIONAL     Allergies   Latex; Iohexol; Sulfa antibiotics; and Penicillins   Review of Systems Review of Systems  Constitutional: Positive for activity change, appetite change, chills and fatigue. Negative for fever.  Eyes: Negative for visual disturbance.  Respiratory: Negative for cough, chest tightness and shortness of breath.   Gastrointestinal: Positive for abdominal pain and nausea. Negative for diarrhea and vomiting.  Genitourinary: Positive for difficulty urinating, dysuria, hematuria, scrotal swelling and testicular pain.  Musculoskeletal: Negative for arthralgias and myalgias.  Neurological: Negative for dizziness, weakness and headaches.   A complete 10 system review of systems was obtained and all systems are negative except as noted in the HPI and PMH.    Physical Exam Updated Vital Signs BP 94/62 (BP Location: Right Arm)   Pulse 72   Temp 97.6 F (36.4 C) (Oral)   Resp 18   Ht 5\' 11"  (1.803 m)   Wt 228 lb 6.3 oz (103.6 kg)   SpO2 97%   BMI 31.85 kg/m   Physical Exam  Constitutional: He is oriented to person, place, and time. He appears well-developed and well-nourished. No distress.  HENT:  Head: Normocephalic and atraumatic.  Mouth/Throat: Oropharynx is clear and moist. No oropharyngeal exudate.  Eyes: Conjunctivae and EOM are normal. Pupils are equal, round, and reactive to light.  Neck: Normal range of motion. Neck supple.  No meningismus.  Cardiovascular: Normal rate, regular rhythm, normal heart sounds and intact distal pulses.   No murmur heard. Pulmonary/Chest: Effort normal and breath sounds normal. No respiratory distress.  Abdominal: Soft. There is tenderness. There is no rebound and no guarding.  Genitourinary:    Genitourinary Comments: L testicle is enlarged and tender. No overlying skin change  Musculoskeletal: Normal range of motion. He exhibits no edema or tenderness.  Neurological: He is alert and oriented to person, place, and time. No cranial nerve deficit. He exhibits normal muscle tone. Coordination normal.  No ataxia on finger to nose bilaterally. No pronator drift. 5/5 strength throughout. CN 2-12 intact.Equal grip strength. Sensation intact.   Skin: Skin is warm.  Psychiatric: He has a normal mood and affect. His behavior is normal.  Nursing note and vitals reviewed.    ED Treatments / Results  Labs (all labs ordered are listed, but only abnormal results are displayed) Labs Reviewed  CBC WITH DIFFERENTIAL/PLATELET - Abnormal; Notable for the following:       Result Value   Monocytes Absolute 1.1 (*)    All other components within normal limits  COMPREHENSIVE METABOLIC  PANEL - Abnormal; Notable for the following:    Potassium 3.3 (*)    Glucose, Bld 202 (*)    Creatinine, Ser 1.26 (*)    GFR calc non Af Amer 56 (*)    All other components within normal limits  URINALYSIS, ROUTINE W REFLEX MICROSCOPIC (NOT AT [facility]) - Abnormal; Notable for the following:    APPearance HAZY (*)    Specific Gravity, Urine 1.034 (*)    Glucose, UA 250 (*)    Hgb urine dipstick SMALL (*)    Nitrite POSITIVE (*)    All other components within normal limits  URINE MICROSCOPIC-ADD ON - Abnormal; Notable for the following:    Squamous Epithelial / LPF 0-5 (*)    Bacteria, UA MANY (*)    Crystals CA OXALATE CRYSTALS (*)    All other components within normal limits  GLUCOSE, CAPILLARY - Abnormal; Notable for the following:    Glucose-Capillary 155 (*)    All other components within normal limits  MRSA PCR SCREENING  CULTURE, BLOOD (ROUTINE X 2)  CULTURE, BLOOD (ROUTINE X 2)  URINE CULTURE  CBC WITH DIFFERENTIAL/PLATELET  COMPREHENSIVE METABOLIC PANEL  I-STAT CG4 LACTIC ACID, ED  I-STAT CG4  LACTIC ACID, ED    EKG  EKG Interpretation None       Radiology US Scrotum  Result Date: 08/03/2015 CLINICAL DATA:  Left-sided testicular pain. EXAM: SCROTAL ULTRASOUND DOPPLER ULTRASOUND OF THE TESTICLES TECHNIQUE: Complete ultrasound examination of the testicles, epididymis, and other scrotal structures was performed. Color and spectral Doppler ultrasound were also utilized to evaluate blood flow to the testicles. COMPARISON:  Ultrasound of 07/29/2015 (which demonstrated left-sided epididymitis). FINDINGS: Right testicle Measurements: 4.0 x 2.0 x 2.5 cm. No mass or microlithiasis visualized. Left testicle Measurements: 3.6 x 3.0 x 2.8 cm. No mass or microlithiasis visualized. Right epididymis:  Within normal limits Left epididymis: Enlarged and edematous. 3.6 x 1.6 x 4.0 cm. Hypervascular, as evidenced by increased color Doppler signal. Hydrocele:  Left-sided hydrocele with complexity is moderate-sized. Varicocele:  None visualized. Pulsed Doppler interrogation of both testes demonstrates normal low resistance arterial and venous waveforms bilaterally. Left-sided skin thickening identified. IMPRESSION: Redemonstration of left-sided epididymitis. Complex left-sided hydrocele is likely secondary. This is minimally enlarged since the prior exam. Electronically Signed   By: Abigail Miyamoto M.D.   On: 08/03/2015 17:47  US Renal  Result Date: 08/03/2015 CLINICAL DATA:  Positive UTI with urinary retention EXAM: RENAL / URINARY TRACT ULTRASOUND COMPLETE COMPARISON:  None. FINDINGS: Right Kidney: Length: 11.7 cm. Echogenicity within normal limits. No mass or hydronephrosis visualized. Left Kidney: Length: 13.1 cm. Echogenicity within normal limits. No mass or hydronephrosis visualized. Bladder: Predominately decompressed IMPRESSION: No acute abnormality is noted in the kidneys. Electronically Signed   By: Inez Catalina M.D.   On: 08/03/2015 17:40  [place] Art/ven Flow Abd Pelv Doppler  Result Date:  08/03/2015 CLINICAL DATA:  Left-sided testicular pain. EXAM: SCROTAL ULTRASOUND DOPPLER ULTRASOUND OF THE TESTICLES TECHNIQUE: Complete ultrasound examination of the testicles, epididymis, and other scrotal structures was performed. Color and spectral Doppler ultrasound were also utilized to evaluate blood flow to the testicles. COMPARISON:  Ultrasound of 07/29/2015 (which demonstrated left-sided epididymitis). FINDINGS: Right testicle Measurements: 4.0 x 2.0 x 2.5 cm. No mass or microlithiasis visualized. Left testicle Measurements: 3.6 x 3.0 x 2.8 cm. No mass or microlithiasis visualized. Right epididymis:  Within normal limits Left epididymis: Enlarged and edematous. 3.6 x 1.6 x 4.0 cm. Hypervascular, as evidenced by increased color Doppler  signal. Hydrocele:  Left-sided hydrocele with complexity is moderate-sized. Varicocele:  None visualized. Pulsed Doppler interrogation of both testes demonstrates normal low resistance arterial and venous waveforms bilaterally. Left-sided skin thickening identified. IMPRESSION: Redemonstration of left-sided epididymitis. Complex left-sided hydrocele is likely secondary. This is minimally enlarged since the prior exam. Electronically Signed   By: Abigail Miyamoto M.D.   On: 08/03/2015 17:47   Procedures Procedures (including critical care time)  Medications Ordered in ED Medications  imipenem-cilastatin (PRIMAXIN) 500 mg in sodium chloride 0.9 % 100 mL IVPB (500 mg Intravenous New Bag/Given 08/03/15 2325)  pantoprazole (PROTONIX) EC tablet 40 mg (40 mg Oral Given 08/03/15 2210)  tamsulosin (FLOMAX) capsule 0.4 mg (not administered)  ALPRAZolam (XANAX) tablet 1 mg (1 mg Oral Given 08/03/15 2210)  brimonidine (ALPHAGAN) 0.2 % ophthalmic solution 1 drop (1 drop Right Eye Given 08/03/15 2208)  famotidine (PEPCID) tablet 20 mg (20 mg Oral Given 08/03/15 2356)  meloxicam (MOBIC) tablet 15 mg (not administered)  metFORMIN (GLUCOPHAGE) tablet 500 mg (not administered)   enoxaparin (LOVENOX) injection 40 mg (40 mg Subcutaneous Given 08/03/15 2208)  0.9 % NaCl with KCl 20 mEq/ L  infusion (1,000 mLs Intravenous New Bag/Given 08/03/15 2056)  ondansetron (ZOFRAN) tablet 4 mg (not administered)    Or  ondansetron (ZOFRAN) injection 4 mg (not administered)  oxyCODONE (Oxy IR/ROXICODONE) immediate release tablet 10 mg (not administered)  sodium chloride 0.9 % bolus 1,000 mL (0 mLs Intravenous Stopped 08/03/15 1750)  ketorolac (TORADOL) 30 MG/ML injection 30 mg (30 mg Intravenous Given 08/03/15 2039)  HYDROmorphone (DILAUDID) injection 1 mg (1 mg Intravenous Given 08/03/15 2039)     Initial Impression / Assessment and Plan / ED Course  I have reviewed the triage vital signs and the nursing notes.  Pertinent labs & imaging results that were available during my care of the patient were reviewed by me and considered in my medical decision making (see chart for details).  Clinical Course  Value Comment By Time   Lactate is normal. No leukocytosis. Creatinine 1.2. No testicular torsion. No hydronephrosis Ezequiel Essex, MD 07/25 1815  MCV: 87.7 (Reviewed) Ezequiel Essex, MD 07/25 1816  Patient sent with positive urine culture for ESBL E coli.  Discussed with Dr. Linus Salmons of ID. He states due to ESBL, nitrofurantoin sensitivity is not accurate. Recommends admission for IV antibiotics.  [place] today shows persistent epididymitis and hydrocele. No testicular torsion. No hydronephrosis.  Imipenem started.  Admission dw Dr. Olevia Bowens. Vitals remain stable in the ED.   Final Clinical Impressions(s) / ED Diagnoses   Final diagnoses:  Testicular pain, left  Urinary tract infection without hematuria, site unspecified  Epididymitis    New Prescriptions Current Discharge Medication List       Ezequiel Essex, MD 08/04/15 0157

## 2015-08-03 NOTE — ED Notes (Signed)
Attempted to call report

## 2015-08-03 NOTE — Progress Notes (Signed)
Pharmacy Antibiotic Note  [name] is a 70 y.o. male admitted on 08/03/2015 with ESBL infection.  Pharmacy has been consulted for primaxin dosing.  Plan: Primaxin 500 mg q6 - Consider de-escalation to ertapenem  Height: 5\' 11"  (180.3 cm) Weight: 233 lb (105.7 kg) IBW/kg (Calculated) : 75.3  Temp (24hrs), Avg:97.6 F (36.4 C), Min:97.6 F (36.4 C), Max:97.6 F (36.4 C)   Recent Labs Lab 07/29/15 1444  WBC 14.2*  CREATININE 1.17    Estimated Creatinine Clearance: 71.7 mL/min (by C-G formula based on SCr of 1.17 mg/dL).    Allergies  Allergen Reactions  . Latex     blisters  . Iohexol      Code: RASH, Onset Date: [id number]   . Sulfa Antibiotics   . Penicillins Itching    PATIENT TOLERATED KEFLEX   Levester Fresh, PharmD, BCPS, [facility] Clinical Pharmacist Pager [phone] 08/03/2015 4:21 PM

## 2015-08-03 NOTE — ED Triage Notes (Signed)
Pt here after he was called by [facility] lab and informed his urine culture was positive. Pt was told to report to the ED for IV antibiotics due to drug resistant organism. Pt reports taking oral antibiotics at home.

## 2015-08-03 NOTE — H&P (Signed)
History and Physical    [name] [id number] DOB: [birth year] DOA: 08/03/2015  PCP: Octavio Graves, DO   Patient coming from: Home/Dr.'s office.  Chief Complaint: Abnormal urine culture and sensitivity.  HPI: [name] is a 70 y.o. male with medical history significant of arthritis, left eye melanoma, type 2 diabetes, GERD who has been on outpatient treatment with oral ciprofloxacin for epididymitis comes to the hospital referred by his PCP after urine culture and sensitivity grew ESBL Escherichia coli resistant to fluoroquinolones, sensitive to gentamicin, Zosyn, nitrofurantoin and imipenem.  Per patient, his pain and swelling in the area has been slowly decreasing and he does not have any further dysuria and denies urethral discharge at any point, but left scrotum area continues to be tender. He denies fever, chills, but complains of mild fatigue, nausea, decreased appetite and decreased sleep due to pain.   ED Course: The patient was started on IV imipenem after the case was discussed by Dr. Wyvonnia Dusky with ID on call. Workup shows decrease WBC when compared to results from 5 days ago and mild hypokalemia at 3.3 mmol per liter.  Review of Systems: As per HPI otherwise 10 point review of systems negative.   Past Medical History:  Diagnosis Date  . Arthritis   . Cancer (HCC)    L EYE - MELANOMA  . Diabetes mellitus    fasting cbg 90-100  . GERD (gastroesophageal reflux disease)     Past Surgical History:  Procedure Laterality Date  . ENUCLEATION  25 YRS AGO    LEFT EYE DUE TO CANCER  . EYE SURGERY     RETINA SURG RT EYE  . I&D EXTREMITY  11/18/2011   Procedure: IRRIGATION AND DEBRIDEMENT EXTREMITY;  Surgeon: Tobi Bastos, MD;  Location: [facility];  Service: Orthopedics;  Laterality: Left;  I & D Left Thigh (left knee and inner thigh)  . KNEE ARTHROSCOPY  08/2010  . LUMBAR LAMINECTOMY/DECOMPRESSION MICRODISCECTOMY Left 09/11/2013   Procedure: Left Lumbar Four-Five   Microdiskectomy;  Surgeon: Erline Levine, MD;  Location: [place] [facility];  Service: Neurosurgery;  Laterality: Left;  Left  Lumbar Four-Five  Microdiskectomy  . TOTAL KNEE ARTHROPLASTY  04/26/2011   Procedure: TOTAL KNEE ARTHROPLASTY;  Surgeon: Tobi Bastos, MD;  Location: [facility];  Service: Orthopedics;  Laterality: Left;  . TUMOR REMOVED     RT LEG  - FATTY TUMOR     reports that he has never smoked. His smokeless tobacco use includes Chew. He reports that he drinks alcohol. He reports that he does not use drugs.  Allergies  Allergen Reactions  . Latex     blisters  . Iohexol      Code: RASH, Onset Date: [id number]   . Sulfa Antibiotics   . Penicillins Itching    PATIENT TOLERATED KEFLEX    History reviewed. No pertinent family history.   Prior to Admission medications   Medication Sig Start Date End Date Taking? Authorizing Provider  ALPRAZolam Duanne Moron) 1 MG tablet Take 1 mg by mouth 2 (two) times daily as needed. Anxiety    Historical Provider, MD  amoxicillin (AMOXIL) 500 MG capsule Take 1,000 mg by mouth 2 (two) times daily.  07/08/15   Historical Provider, MD  brimonidine (ALPHAGAN) 0.2 % ophthalmic solution Place 1 drop into the right eye 2 (two) times daily.    Historical Provider, MD  ciprofloxacin (CIPRO) 500 MG tablet Take 1 tablet (500 mg total) by mouth every 12 (twelve) hours. 07/29/15  Tanna Furry, MD  clarithromycin (BIAXIN) 500 MG tablet Take 1 tablet by mouth 2 (two) times daily. Reported on 07/29/2015 07/08/15   Historical Provider, MD  Cyanocobalamin (VITAMIN B12 PO) Take 1 tablet by mouth daily.    Historical Provider, MD  HYDROcodone-acetaminophen (NORCO/VICODIN) 5-325 MG tablet Take 2 tablets by mouth every 4 (four) hours as needed. 07/29/15   Tanna Furry, MD  meloxicam (MOBIC) 15 MG tablet Take 15 mg by mouth daily.    Historical Provider, MD  metFORMIN (GLUCOPHAGE) 500 MG tablet Take 500 mg by mouth 2 (two) times daily with a meal.    Historical Provider, MD    omeprazole (PRILOSEC) 20 MG capsule Take 1 capsule by mouth 2 (two) times daily. 07/12/15   Historical Provider, MD  ranitidine (ZANTAC) 150 MG tablet Take 150 mg by mouth 2 (two) times daily.    Historical Provider, MD  tamsulosin (FLOMAX) 0.4 MG CAPS capsule Take 1 capsule by mouth daily. 06/08/15   Historical Provider, MD    Physical Exam: Vitals:   08/03/15 1815 08/03/15 1820 08/03/15 1945 08/03/15 2145  BP: 122/63 121/66  94/62  Pulse: 73 71  72  Resp:  14  18  Temp:   98.6 F (37 C) 97.6 F (36.4 C)  TempSrc:   Oral Oral  SpO2: 100% 98%  97%  Weight:   103.6 kg (228 lb 6.3 oz)   Height:   5\' 11"  (1.803 m)       Constitutional: NAD, calm, comfortable Vitals:   08/03/15 1815 08/03/15 1820 08/03/15 1945 08/03/15 2145  BP: 122/63 121/66  94/62  Pulse: 73 71  72  Resp:  14  18  Temp:   98.6 F (37 C) 97.6 F (36.4 C)  TempSrc:   Oral Oral  SpO2: 100% 98%  97%  Weight:   103.6 kg (228 lb 6.3 oz)   Height:   5\' 11"  (1.803 m)    Eyes: Left eye enucleation, lids and conjunctivae normal ENMT: Mucous membranes are moist. Posterior pharynx clear of any exudate or lesions. Neck: normal, supple, no masses, no thyromegaly Respiratory: clear to auscultation bilaterally, no wheezing, no crackles. Normal respiratory effort. No accessory muscle use.  Cardiovascular: Regular rate and rhythm, no murmurs / rubs / gallops. No extremity edema. 2+ pedal pulses. No carotid bruits.  Abdomen: Obese, mild lower quadrants tenderness, no guarding/rebound/masses palpated. No hepatosplenomegaly. Bowel sounds positive.  Musculoskeletal: no clubbing / cyanosis. Good ROM, no contractures. Normal muscle tone.  GU: Left scrotal edema, erythema and tenderness. No urethral discharge.  Skin: no rashes, lesions, ulcers. No induration Neurologic: CN 2-12 grossly intact. Sensation intact, DTR normal. Strength 5/5 in all 4.  Psychiatric: Normal judgment and insight. Alert and oriented x 4. Normal mood.      Labs on Admission: I have personally reviewed following labs and imaging studies  CBC:  Recent Labs Lab 07/29/15 1444 08/03/15 1600  WBC 14.2* 7.6  NEUTROABS 11.1* 4.6  HGB 13.7 13.9  HCT 43.6 43.4  MCV 90.3 87.7  PLT 231 [id number]   Basic Metabolic Panel:  Recent Labs Lab 07/29/15 1444 08/03/15 1600  NA 136 137  K 3.7 3.3*  CL 102 103  CO2 26 25  GLUCOSE 151* 202*  BUN 11 16  CREATININE 1.17 1.26*  CALCIUM 10.1 10.0   GFR: Estimated Creatinine Clearance: 65.9 mL/min (by C-G formula based on SCr of 1.26 mg/dL). Liver Function Tests:  Recent Labs Lab 08/03/15 1600  AST 23  ALT  22  ALKPHOS 57  BILITOT 0.6  PROT 7.2  ALBUMIN 3.7   Urine analysis:    Component Value Date/Time   COLORURINE YELLOW 08/03/2015 1758   APPEARANCEUR HAZY (A) 08/03/2015 1758   LABSPEC 1.034 (H) 08/03/2015 1758   PHURINE 5.5 08/03/2015 1758   GLUCOSEU 250 (A) 08/03/2015 1758   HGBUR SMALL (A) 08/03/2015 1758   BILIRUBINUR NEGATIVE 08/03/2015 1758   KETONESUR NEGATIVE 08/03/2015 1758   PROTEINUR NEGATIVE 08/03/2015 1758   UROBILINOGEN 4.0 (H) 11/17/2011 2148   NITRITE POSITIVE (A) 08/03/2015 1758   LEUKOCYTESUR NEGATIVE 08/03/2015 1758    Recent Results (from the past 240 hour(s))  Urine culture     Status: Abnormal   Collection Time: 07/29/15  1:10 PM  Result Value Ref Range Status   Specimen Description URINE, RANDOM  Final   Special Requests none Normal  Final   Culture (A)  Final    >=100,000 COLONIES/mL ESCHERICHIA COLI Confirmed Extended Spectrum Beta-Lactamase Producer (ESBL)    Report Status 08/01/2015 FINAL  Final   Organism ID, Bacteria ESCHERICHIA COLI (A)  Final      Susceptibility   Escherichia coli - MIC*    AMPICILLIN >=32 RESISTANT Resistant     CEFAZOLIN >=64 RESISTANT Resistant     CEFTRIAXONE >=64 RESISTANT Resistant     CIPROFLOXACIN >=4 RESISTANT Resistant     GENTAMICIN <=1 SENSITIVE Sensitive     IMIPENEM <=0.25 SENSITIVE Sensitive      NITROFURANTOIN <=16 SENSITIVE Sensitive     TRIMETH/SULFA >=320 RESISTANT Resistant     AMPICILLIN/SULBACTAM 16 INTERMEDIATE Intermediate     PIP/TAZO <=4 SENSITIVE Sensitive     * >=100,000 COLONIES/mL ESCHERICHIA COLI     Radiological Exams on Admission: US Scrotum  Result Date: 08/03/2015 CLINICAL DATA:  Left-sided testicular pain. EXAM: SCROTAL ULTRASOUND DOPPLER ULTRASOUND OF THE TESTICLES TECHNIQUE: Complete ultrasound examination of the testicles, epididymis, and other scrotal structures was performed. Color and spectral Doppler ultrasound were also utilized to evaluate blood flow to the testicles. COMPARISON:  Ultrasound of 07/29/2015 (which demonstrated left-sided epididymitis). FINDINGS: Right testicle Measurements: 4.0 x 2.0 x 2.5 cm. No mass or microlithiasis visualized. Left testicle Measurements: 3.6 x 3.0 x 2.8 cm. No mass or microlithiasis visualized. Right epididymis:  Within normal limits Left epididymis: Enlarged and edematous. 3.6 x 1.6 x 4.0 cm. Hypervascular, as evidenced by increased color Doppler signal. Hydrocele:  Left-sided hydrocele with complexity is moderate-sized. Varicocele:  None visualized. Pulsed Doppler interrogation of both testes demonstrates normal low resistance arterial and venous waveforms bilaterally. Left-sided skin thickening identified. IMPRESSION: Redemonstration of left-sided epididymitis. Complex left-sided hydrocele is likely secondary. This is minimally enlarged since the prior exam. Electronically Signed   By: Abigail Miyamoto M.D.   On: 08/03/2015 17:47  US Renal  Result Date: 08/03/2015 CLINICAL DATA:  Positive UTI with urinary retention EXAM: RENAL / URINARY TRACT ULTRASOUND COMPLETE COMPARISON:  None. FINDINGS: Right Kidney: Length: 11.7 cm. Echogenicity within normal limits. No mass or hydronephrosis visualized. Left Kidney: Length: 13.1 cm. Echogenicity within normal limits. No mass or hydronephrosis visualized. Bladder: Predominately decompressed  IMPRESSION: No acute abnormality is noted in the kidneys. Electronically Signed   By: Inez Catalina M.D.   On: 08/03/2015 17:40  [place] Art/ven Flow Abd Pelv Doppler  Result Date: 08/03/2015 CLINICAL DATA:  Left-sided testicular pain. EXAM: SCROTAL ULTRASOUND DOPPLER ULTRASOUND OF THE TESTICLES TECHNIQUE: Complete ultrasound examination of the testicles, epididymis, and other scrotal structures was performed. Color and spectral Doppler ultrasound were also  utilized to evaluate blood flow to the testicles. COMPARISON:  Ultrasound of 07/29/2015 (which demonstrated left-sided epididymitis). FINDINGS: Right testicle Measurements: 4.0 x 2.0 x 2.5 cm. No mass or microlithiasis visualized. Left testicle Measurements: 3.6 x 3.0 x 2.8 cm. No mass or microlithiasis visualized. Right epididymis:  Within normal limits Left epididymis: Enlarged and edematous. 3.6 x 1.6 x 4.0 cm. Hypervascular, as evidenced by increased color Doppler signal. Hydrocele:  Left-sided hydrocele with complexity is moderate-sized. Varicocele:  None visualized. Pulsed Doppler interrogation of both testes demonstrates normal low resistance arterial and venous waveforms bilaterally. Left-sided skin thickening identified. IMPRESSION: Redemonstration of left-sided epididymitis. Complex left-sided hydrocele is likely secondary. This is minimally enlarged since the prior exam. Electronically Signed   By: Abigail Miyamoto M.D.   On: 08/03/2015 17:47    Assessment/Plan Principal Problem:   Epididymitis Admit to MedSurg/inpatient. Positive urine culture to ESBL Escherichia coli. Continue imipenem 500 mg every 6 hours. Continue analgesics as needed. Follow-up urine culture and sensitivity. Reconsult infectious disease service if needed.  Active Problems:   Hypokalemia Replacing with IV fluids. Follow-up potassium level in a.m.    Type 2 diabetes mellitus (HCC) Carbohydrate modified diet. CBG monitoring before meals. Continue metformin 500 mg by  mouth twice a day. Consider adding regular insulin sliding scale if blood glucose control is not achieved.    Anxiety No signs of anxiety at this time. Continue alprazolam as needed.    BPH (benign prostatic hyperplasia)  Continue Flomax.    Osteoarthritis, knee Continue analgesics as needed.  DVT prophylaxis: Lovenox. Code Status: Full code. Family Communication:  Disposition Plan: Admit for IV antibiotic therapy for several days. Consults called: ID was contacted by the emergency department and suggested imipenem. Admission status: Inpatient/MedSurg.   Reubin Milan MD [facility] Pager [phone].  If 7PM-7AM, please contact night-coverage [email or web address] Password [facility]  08/03/2015, 9:54 PM

## 2015-08-03 NOTE — ED Notes (Signed)
Patient transported to Ultrasound 

## 2015-08-04 DIAGNOSIS — N451 Epididymitis: Principal | ICD-10-CM

## 2015-08-04 LAB — COMPREHENSIVE METABOLIC PANEL
ALT: 17 U/L (ref 17–63)
ANION GAP: 8 (ref 5–15)
AST: 17 U/L (ref 15–41)
Albumin: 2.8 g/dL — ABNORMAL LOW (ref 3.5–5.0)
Alkaline Phosphatase: 45 U/L (ref 38–126)
BUN: 16 mg/dL (ref 6–20)
CALCIUM: 9 mg/dL (ref 8.9–10.3)
CO2: 27 mmol/L (ref 22–32)
Chloride: 104 mmol/L (ref 101–111)
Creatinine, Ser: 1.04 mg/dL (ref 0.61–1.24)
Glucose, Bld: 118 mg/dL — ABNORMAL HIGH (ref 65–99)
POTASSIUM: 3.6 mmol/L (ref 3.5–5.1)
Sodium: 139 mmol/L (ref 135–145)
TOTAL PROTEIN: 5.6 g/dL — AB (ref 6.5–8.1)
Total Bilirubin: 0.5 mg/dL (ref 0.3–1.2)

## 2015-08-04 LAB — CBC WITH DIFFERENTIAL/PLATELET
BASOS PCT: 1 %
Basophils Absolute: 0.1 10*3/uL (ref 0.0–0.1)
EOS ABS: 0.2 10*3/uL (ref 0.0–0.7)
Eosinophils Relative: 3 %
HCT: 36.6 % — ABNORMAL LOW (ref 39.0–52.0)
Hemoglobin: 11.4 g/dL — ABNORMAL LOW (ref 13.0–17.0)
LYMPHS ABS: 2.4 10*3/uL (ref 0.7–4.0)
Lymphocytes Relative: 35 %
MCH: 27.6 pg (ref 26.0–34.0)
MCHC: 31.1 g/dL (ref 30.0–36.0)
MCV: 88.6 fL (ref 78.0–100.0)
MONO ABS: 1.1 10*3/uL — AB (ref 0.1–1.0)
Monocytes Relative: 16 %
NEUTROS ABS: 3.1 10*3/uL (ref 1.7–7.7)
Neutrophils Relative %: 45 %
PLATELETS: 223 10*3/uL (ref 150–400)
RBC: 4.13 MIL/uL — ABNORMAL LOW (ref 4.22–5.81)
RDW: 14.8 % (ref 11.5–15.5)
WBC: 6.9 10*3/uL (ref 4.0–10.5)

## 2015-08-04 LAB — GLUCOSE, CAPILLARY
GLUCOSE-CAPILLARY: 106 mg/dL — AB (ref 65–99)
Glucose-Capillary: 123 mg/dL — ABNORMAL HIGH (ref 65–99)
Glucose-Capillary: 121 mg/dL — ABNORMAL HIGH (ref 65–99)
Glucose-Capillary: 140 mg/dL — ABNORMAL HIGH (ref 65–99)

## 2015-08-04 MED ORDER — INSULIN ASPART 100 UNIT/ML ~~LOC~~ SOLN
0.0000 [IU] | Freq: Three times a day (TID) | SUBCUTANEOUS | Status: DC
  Administered 2015-08-04 – 2015-08-05 (×3): 1 [IU] via SUBCUTANEOUS
  Administered 2015-08-06: 2 [IU] via SUBCUTANEOUS

## 2015-08-04 MED ORDER — SODIUM CHLORIDE 0.9 % IV BOLUS (SEPSIS)
1000.0000 mL | Freq: Once | INTRAVENOUS | Status: AC
  Administered 2015-08-04: 1000 mL via INTRAVENOUS

## 2015-08-04 NOTE — Progress Notes (Signed)
PROGRESS NOTE  [name] [id number] DOB: 1945/03/26 DOA: 08/03/2015 PCP: Octavio Graves, DO  HPI/Recap of past 24 hours:  Reported feeling better, less scrotal pain  Assessment/Plan: Principal Problem:   Epididymitis Active Problems:   Osteoarthritis, knee   Type 2 diabetes mellitus (HCC)   Anxiety   BPH (benign prostatic hyperplasia)    left-sided epididymitis. Complex left-sided hydrocele : on imipenem, urology and ID consulted  MDRO UTI, currently on imipenem, ID consulted for recommendation of abx  Hypotension: required fluids boluses  Aki: cr .126 on presentation, today 1.04  noninuslin dependent dm2, home oral meds held, on ssi here  H/o left eye myeloma, s/po surgical resection,  Reports not able to read or write at baseline  Code Status: full  Family Communication: patient   Disposition Plan: home in a few days   Consultants:  Urology Dr Matilde Sprang   Infectious disease  Procedures:  none  Antibiotics:  imipenem   Objective: BP (!) 93/55   Pulse (!) 57   Temp 97.8 F (36.6 C) (Oral)   Resp 20   Ht 5\' 11"  (1.803 m)   Wt 103.6 kg (228 lb 6.3 oz)   SpO2 98%   BMI 31.85 kg/m   Intake/Output Summary (Last 24 hours) at 08/04/15 1342 Last data filed at 08/04/15 1100  Gross per 24 hour  Intake             1760 ml  Output              250 ml  Net             1510 ml   Filed Weights   08/03/15 1325 08/03/15 1945  Weight: 105.7 kg (233 lb) 103.6 kg (228 lb 6.3 oz)    Exam:   General:  NAD, left eye blindness  Cardiovascular: RRR  Respiratory: CTABL  Abdomen: Soft/ND/NT, positive BS  Musculoskeletal: No Edema  Neuro: aaox3  Urology: scrotal erythema and tenderness  Data Reviewed: Basic Metabolic Panel:  Recent Labs Lab 07/29/15 1444 08/03/15 1600 08/04/15 0603  NA 136 137 139  K 3.7 3.3* 3.6  CL 102 103 104  CO2 26 25 27   GLUCOSE 151* 202* 118*  BUN 11 16 16   CREATININE 1.17 1.26* 1.04  CALCIUM 10.1  10.0 9.0   Liver Function Tests:  Recent Labs Lab 08/03/15 1600 08/04/15 0603  AST 23 17  ALT 22 17  ALKPHOS 57 45  BILITOT 0.6 0.5  PROT 7.2 5.6*  ALBUMIN 3.7 2.8*   No results for input(s): LIPASE, AMYLASE in the last 168 hours. No results for input(s): AMMONIA in the last 168 hours. CBC:  Recent Labs Lab 07/29/15 1444 08/03/15 1600 08/04/15 0603  WBC 14.2* 7.6 6.9  NEUTROABS 11.1* 4.6 3.1  HGB 13.7 13.9 11.4*  HCT 43.6 43.4 36.6*  MCV 90.3 87.7 88.6  PLT 231 256 223   Cardiac Enzymes:   No results for input(s): CKTOTAL, CKMB, CKMBINDEX, TROPONINI in the last 168 hours. BNP (last 3 results) No results for input(s): BNP in the last 8760 hours.  ProBNP (last 3 results) No results for input(s): PROBNP in the last 8760 hours.  CBG:  Recent Labs Lab 08/03/15 2144 08/04/15 0800 08/04/15 1212  GLUCAP 155* 106* 123*    Recent Results (from the past 240 hour(s))  Urine culture     Status: Abnormal   Collection Time: 07/29/15  1:10 PM  Result Value Ref Range Status   Specimen Description URINE,  RANDOM  Final   Special Requests none Normal  Final   Culture (A)  Final    >=100,000 COLONIES/mL ESCHERICHIA COLI Confirmed Extended Spectrum Beta-Lactamase Producer (ESBL)    Report Status 08/01/2015 FINAL  Final   Organism ID, Bacteria ESCHERICHIA COLI (A)  Final      Susceptibility   Escherichia coli - MIC*    AMPICILLIN >=32 RESISTANT Resistant     CEFAZOLIN >=64 RESISTANT Resistant     CEFTRIAXONE >=64 RESISTANT Resistant     CIPROFLOXACIN >=4 RESISTANT Resistant     GENTAMICIN <=1 SENSITIVE Sensitive     IMIPENEM <=0.25 SENSITIVE Sensitive     NITROFURANTOIN <=16 SENSITIVE Sensitive     TRIMETH/SULFA >=320 RESISTANT Resistant     AMPICILLIN/SULBACTAM 16 INTERMEDIATE Intermediate     PIP/TAZO <=4 SENSITIVE Sensitive     * >=100,000 COLONIES/mL ESCHERICHIA COLI  MRSA PCR Screening     Status: None   Collection Time: 08/03/15  8:48 PM  Result Value Ref  Range Status   MRSA by PCR NEGATIVE NEGATIVE Final    Comment:        The GeneXpert MRSA Assay (FDA approved for NASAL specimens only), is one component of a comprehensive MRSA colonization surveillance program. It is not intended to diagnose MRSA infection nor to guide or monitor treatment for MRSA infections.      Studies: US Scrotum  Result Date: 08/03/2015 CLINICAL DATA:  Left-sided testicular pain. EXAM: SCROTAL ULTRASOUND DOPPLER ULTRASOUND OF THE TESTICLES TECHNIQUE: Complete ultrasound examination of the testicles, epididymis, and other scrotal structures was performed. Color and spectral Doppler ultrasound were also utilized to evaluate blood flow to the testicles. COMPARISON:  Ultrasound of 07/29/2015 (which demonstrated left-sided epididymitis). FINDINGS: Right testicle Measurements: 4.0 x 2.0 x 2.5 cm. No mass or microlithiasis visualized. Left testicle Measurements: 3.6 x 3.0 x 2.8 cm. No mass or microlithiasis visualized. Right epididymis:  Within normal limits Left epididymis: Enlarged and edematous. 3.6 x 1.6 x 4.0 cm. Hypervascular, as evidenced by increased color Doppler signal. Hydrocele:  Left-sided hydrocele with complexity is moderate-sized. Varicocele:  None visualized. Pulsed Doppler interrogation of both testes demonstrates normal low resistance arterial and venous waveforms bilaterally. Left-sided skin thickening identified. IMPRESSION: Redemonstration of left-sided epididymitis. Complex left-sided hydrocele is likely secondary. This is minimally enlarged since the prior exam. Electronically Signed   By: Abigail Miyamoto M.D.   On: 08/03/2015 17:47  US Renal  Result Date: 08/03/2015 CLINICAL DATA:  Positive UTI with urinary retention EXAM: RENAL / URINARY TRACT ULTRASOUND COMPLETE COMPARISON:  None. FINDINGS: Right Kidney: Length: 11.7 cm. Echogenicity within normal limits. No mass or hydronephrosis visualized. Left Kidney: Length: 13.1 cm. Echogenicity within normal  limits. No mass or hydronephrosis visualized. Bladder: Predominately decompressed IMPRESSION: No acute abnormality is noted in the kidneys. Electronically Signed   By: Inez Catalina M.D.   On: 08/03/2015 17:40  [place] Art/ven Flow Abd Pelv Doppler  Result Date: 08/03/2015 CLINICAL DATA:  Left-sided testicular pain. EXAM: SCROTAL ULTRASOUND DOPPLER ULTRASOUND OF THE TESTICLES TECHNIQUE: Complete ultrasound examination of the testicles, epididymis, and other scrotal structures was performed. Color and spectral Doppler ultrasound were also utilized to evaluate blood flow to the testicles. COMPARISON:  Ultrasound of 07/29/2015 (which demonstrated left-sided epididymitis). FINDINGS: Right testicle Measurements: 4.0 x 2.0 x 2.5 cm. No mass or microlithiasis visualized. Left testicle Measurements: 3.6 x 3.0 x 2.8 cm. No mass or microlithiasis visualized. Right epididymis:  Within normal limits Left epididymis: Enlarged and edematous. 3.6 x 1.6 x  4.0 cm. Hypervascular, as evidenced by increased color Doppler signal. Hydrocele:  Left-sided hydrocele with complexity is moderate-sized. Varicocele:  None visualized. Pulsed Doppler interrogation of both testes demonstrates normal low resistance arterial and venous waveforms bilaterally. Left-sided skin thickening identified. IMPRESSION: Redemonstration of left-sided epididymitis. Complex left-sided hydrocele is likely secondary. This is minimally enlarged since the prior exam. Electronically Signed   By: Abigail Miyamoto M.D.   On: 08/03/2015 17:47   Scheduled Meds: . brimonidine  1 drop Right Eye BID  . enoxaparin (LOVENOX) injection  40 mg Subcutaneous Q24H  . famotidine  20 mg Oral BID  . imipenem-cilastatin  500 mg Intravenous Q6H  . insulin aspart  0-9 Units Subcutaneous TID WC  . meloxicam  15 mg Oral Daily  . pantoprazole  40 mg Oral BID  . tamsulosin  0.4 mg Oral Daily    Continuous Infusions: . 0.9 % NaCl with KCl 20 mEq / L 1,000 mL (08/04/15 0758)      Time spent: 98mins  Maleik Vanderzee MD, PhD  [facility] Pager [phone]. If 7PM-7AM, please contact night-coverage at [email or web address], password [facility] 08/04/2015, 1:42 PM  LOS: 1 day

## 2015-08-04 NOTE — Care Management Important Message (Signed)
Important Message  Patient Details  Name: [name] MRN: [id number] Date of Birth: Apr 18, 1945   Medicare Important Message Given:  Yes    [name] 08/04/2015, 1:18 PM

## 2015-08-04 NOTE — Consult Note (Signed)
Urology Consult  Referring physician: Dillard Cannon Reason for referral: epididymitis  Chief Complaint: epididymitis  History of Present Illness: 70 year old male treated as outpt with cipro for epididymitis; urine culture resistant; pain ad swelling by history getting less; pain left scrotum; on imipenem; dysuria once and no increased frequency  Had one stone 20 years ago; No UTI or GU surgery  Modifying factors: There are no other modifying factors  Associated signs and symptoms: There are no other associated signs and symptoms Aggravating and relieving factors: There are no other aggravating or relieving factors Severity: Moderate Duration: Persistent  Scrotal u/sound: repeated: epididymitis with enlarged swollen left epididymis; moderate sized complex u/sound; similar finding earlier u/sound  Past Medical History:  Diagnosis Date  . Arthritis   . Cancer (HCC)    L EYE - MELANOMA  . Diabetes mellitus    fasting cbg 90-100  . GERD (gastroesophageal reflux disease)    Past Surgical History:  Procedure Laterality Date  . ENUCLEATION  25 YRS AGO    LEFT EYE DUE TO CANCER  . EYE SURGERY     RETINA SURG RT EYE  . I&D EXTREMITY  11/18/2011   Procedure: IRRIGATION AND DEBRIDEMENT EXTREMITY;  Surgeon: Tobi Bastos, MD;  Location: [facility];  Service: Orthopedics;  Laterality: Left;  I & D Left Thigh (left knee and inner thigh)  . KNEE ARTHROSCOPY  08/2010  . LUMBAR LAMINECTOMY/DECOMPRESSION MICRODISCECTOMY Left 09/11/2013   Procedure: Left Lumbar Four-Five  Microdiskectomy;  Surgeon: Erline Levine, MD;  Location: [facility];  Service: Neurosurgery;  Laterality: Left;  Left  Lumbar Four-Five  Microdiskectomy  . TOTAL KNEE ARTHROPLASTY  04/26/2011   Procedure: TOTAL KNEE ARTHROPLASTY;  Surgeon: Tobi Bastos, MD;  Location: [facility];  Service: Orthopedics;  Laterality: Left;  . TUMOR REMOVED     RT LEG  - FATTY TUMOR    Medications: I have reviewed the patient's current  medications. Allergies:  Allergies  Allergen Reactions  . Latex     blisters  . Iohexol      Code: RASH, Onset Date: [id number]   . Sulfa Antibiotics   . Penicillins Itching    PATIENT TOLERATED KEFLEX    History reviewed. No pertinent family history. Social History:  reports that he has never smoked. His smokeless tobacco use includes Chew. He reports that he drinks alcohol. He reports that he does not use drugs.  ROS: All systems are reviewed and negative except as noted. Rest negative  Physical Exam:  Vital signs in last 24 hours: Temp:  [97.6 F (36.4 C)-98.6 F (37 C)] 98.1 F (36.7 C) (07/26 1500) Pulse Rate:  [54-88] 54 (07/26 1500) Resp:  [14-20] 20 (07/26 1500) BP: (86-135)/(48-80) 86/52 (07/26 1500) SpO2:  [96 %-100 %] 96 % (07/26 1500) Weight:  [103.6 kg (228 lb 6.3 oz)] 103.6 kg (228 lb 6.3 oz) (07/25 1945)  Cardiovascular: Skin warm; not flushed Respiratory: Breaths quiet; no shortness of breath Abdomen: No masses Neurological: Normal sensation to touch Musculoskeletal: Normal motor function arms and legs Lymphatics: No inguinal adenopathy Skin: No rashes Genitourinary:swollen left epdidymis with tenderness; testes of left normal; minimal hydrocele; no cellulitis   Laboratory Data:  Results for orders placed or performed during the hospital encounter of 08/03/15 (from the past 72 hour(s))  CBC with Differential/Platelet     Status: Abnormal   Collection Time: 08/03/15  4:00 PM  Result Value Ref Range   WBC 7.6 4.0 - 10.5 K/uL   RBC  4.95 4.22 - 5.81 MIL/uL   Hemoglobin 13.9 13.0 - 17.0 g/dL   HCT 43.4 39.0 - 52.0 %   MCV 87.7 78.0 - 100.0 fL   MCH 28.1 26.0 - 34.0 pg   MCHC 32.0 30.0 - 36.0 g/dL   RDW 14.5 11.5 - 15.5 %   Platelets 256 150 - 400 K/uL   Neutrophils Relative % 62 %   Lymphocytes Relative 22 %   Monocytes Relative 14 %   Eosinophils Relative 1 %   Basophils Relative 1 %   Neutro Abs 4.6 1.7 - 7.7 K/uL   Lymphs Abs 1.7 0.7 - 4.0 K/uL    Monocytes Absolute 1.1 (H) 0.1 - 1.0 K/uL   Eosinophils Absolute 0.1 0.0 - 0.7 K/uL   Basophils Absolute 0.1 0.0 - 0.1 K/uL   WBC Morphology FEW ATYPICAL LYMPHS NOTED   Comprehensive metabolic panel     Status: Abnormal   Collection Time: 08/03/15  4:00 PM  Result Value Ref Range   Sodium 137 135 - 145 mmol/L   Potassium 3.3 (L) 3.5 - 5.1 mmol/L   Chloride 103 101 - 111 mmol/L   CO2 25 22 - 32 mmol/L   Glucose, Bld 202 (H) 65 - 99 mg/dL   BUN 16 6 - 20 mg/dL   Creatinine, Ser 1.26 (H) 0.61 - 1.24 mg/dL   Calcium 10.0 8.9 - 10.3 mg/dL   Total Protein 7.2 6.5 - 8.1 g/dL   Albumin 3.7 3.5 - 5.0 g/dL   AST 23 15 - 41 U/L   ALT 22 17 - 63 U/L   Alkaline Phosphatase 57 38 - 126 U/L   Total Bilirubin 0.6 0.3 - 1.2 mg/dL   GFR calc non Af Amer 56 (L) >60 mL/min   GFR calc Af Amer >60 >60 mL/min    Comment: (NOTE) The eGFR has been calculated using the CKD EPI equation. This calculation has not been validated in all clinical situations. eGFR's persistently <60 mL/min signify possible Chronic Kidney Disease.    Anion gap 9 5 - 15  I-Stat CG4 Lactic Acid, ED     Status: None   Collection Time: 08/03/15  4:20 PM  Result Value Ref Range   Lactic Acid, Venous 1.46 0.5 - 1.9 mmol/L  Urinalysis, Routine w reflex microscopic (not at [facility])     Status: Abnormal   Collection Time: 08/03/15  5:58 PM  Result Value Ref Range   Color, Urine YELLOW YELLOW   APPearance HAZY (A) CLEAR   Specific Gravity, Urine 1.034 (H) 1.005 - 1.030   pH 5.5 5.0 - 8.0   Glucose, UA 250 (A) NEGATIVE mg/dL   Hgb urine dipstick SMALL (A) NEGATIVE   Bilirubin Urine NEGATIVE NEGATIVE   Ketones, ur NEGATIVE NEGATIVE mg/dL   Protein, ur NEGATIVE NEGATIVE mg/dL   Nitrite POSITIVE (A) NEGATIVE   Leukocytes, UA NEGATIVE NEGATIVE  Urine microscopic-add on     Status: Abnormal   Collection Time: 08/03/15  5:58 PM  Result Value Ref Range   Squamous Epithelial / LPF 0-5 (A) NONE SEEN   WBC, UA 6-30 0 - 5 WBC/hpf    RBC / HPF 0-5 0 - 5 RBC/hpf   Bacteria, UA MANY (A) NONE SEEN   Crystals CA OXALATE CRYSTALS (A) NEGATIVE  Urine culture     Status: Abnormal (Preliminary result)   Collection Time: 08/03/15  5:59 PM  Result Value Ref Range   Specimen Description URINE, RANDOM    Special Requests NONE  Culture >=100,000 COLONIES/mL ESCHERICHIA COLI (A)    Report Status PENDING   I-Stat CG4 Lactic Acid, ED     Status: None   Collection Time: 08/03/15  7:49 PM  Result Value Ref Range   Lactic Acid, Venous 1.00 0.5 - 1.9 mmol/L  MRSA PCR Screening     Status: None   Collection Time: 08/03/15  8:48 PM  Result Value Ref Range   MRSA by PCR NEGATIVE NEGATIVE    Comment:        The GeneXpert MRSA Assay (FDA approved for NASAL specimens only), is one component of a comprehensive MRSA colonization surveillance program. It is not intended to diagnose MRSA infection nor to guide or monitor treatment for MRSA infections.   Glucose, capillary     Status: Abnormal   Collection Time: 08/03/15  9:44 PM  Result Value Ref Range   Glucose-Capillary 155 (H) 65 - 99 mg/dL  CBC WITH DIFFERENTIAL     Status: Abnormal   Collection Time: 08/04/15  6:03 AM  Result Value Ref Range   WBC 6.9 4.0 - 10.5 K/uL   RBC 4.13 (L) 4.22 - 5.81 MIL/uL   Hemoglobin 11.4 (L) 13.0 - 17.0 g/dL    Comment: REPEATED TO VERIFY   HCT 36.6 (L) 39.0 - 52.0 %   MCV 88.6 78.0 - 100.0 fL   MCH 27.6 26.0 - 34.0 pg   MCHC 31.1 30.0 - 36.0 g/dL   RDW 14.8 11.5 - 15.5 %   Platelets 223 150 - 400 K/uL   Neutrophils Relative % 45 %   Lymphocytes Relative 35 %   Monocytes Relative 16 %   Eosinophils Relative 3 %   Basophils Relative 1 %   Neutro Abs 3.1 1.7 - 7.7 K/uL   Lymphs Abs 2.4 0.7 - 4.0 K/uL   Monocytes Absolute 1.1 (H) 0.1 - 1.0 K/uL   Eosinophils Absolute 0.2 0.0 - 0.7 K/uL   Basophils Absolute 0.1 0.0 - 0.1 K/uL   RBC Morphology POLYCHROMASIA PRESENT     Comment: ELLIPTOCYTES   WBC Morphology ATYPICAL LYMPHOCYTES      Comment: MILD LEFT SHIFT (1-5% METAS, OCC MYELO, OCC BANDS)  Comprehensive metabolic panel     Status: Abnormal   Collection Time: 08/04/15  6:03 AM  Result Value Ref Range   Sodium 139 135 - 145 mmol/L   Potassium 3.6 3.5 - 5.1 mmol/L   Chloride 104 101 - 111 mmol/L   CO2 27 22 - 32 mmol/L   Glucose, Bld 118 (H) 65 - 99 mg/dL   BUN 16 6 - 20 mg/dL   Creatinine, Ser 1.04 0.61 - 1.24 mg/dL   Calcium 9.0 8.9 - 10.3 mg/dL   Total Protein 5.6 (L) 6.5 - 8.1 g/dL   Albumin 2.8 (L) 3.5 - 5.0 g/dL   AST 17 15 - 41 U/L   ALT 17 17 - 63 U/L   Alkaline Phosphatase 45 38 - 126 U/L   Total Bilirubin 0.5 0.3 - 1.2 mg/dL   GFR calc non Af Amer >60 >60 mL/min   GFR calc Af Amer >60 >60 mL/min    Comment: (NOTE) The eGFR has been calculated using the CKD EPI equation. This calculation has not been validated in all clinical situations. eGFR's persistently <60 mL/min signify possible Chronic Kidney Disease.    Anion gap 8 5 - 15  Glucose, capillary     Status: Abnormal   Collection Time: 08/04/15  8:00 AM  Result Value Ref Range  Glucose-Capillary 106 (H) 65 - 99 mg/dL  Glucose, capillary     Status: Abnormal   Collection Time: 08/04/15 12:12 PM  Result Value Ref Range   Glucose-Capillary 123 (H) 65 - 99 mg/dL   Recent Results (from the past 240 hour(s))  Urine culture     Status: Abnormal   Collection Time: 07/29/15  1:10 PM  Result Value Ref Range Status   Specimen Description URINE, RANDOM  Final   Special Requests none Normal  Final   Culture (A)  Final    >=100,000 COLONIES/mL ESCHERICHIA COLI Confirmed Extended Spectrum Beta-Lactamase Producer (ESBL)    Report Status 08/01/2015 FINAL  Final   Organism ID, Bacteria ESCHERICHIA COLI (A)  Final      Susceptibility   Escherichia coli - MIC*    AMPICILLIN >=32 RESISTANT Resistant     CEFAZOLIN >=64 RESISTANT Resistant     CEFTRIAXONE >=64 RESISTANT Resistant     CIPROFLOXACIN >=4 RESISTANT Resistant     GENTAMICIN <=1 SENSITIVE  Sensitive     IMIPENEM <=0.25 SENSITIVE Sensitive     NITROFURANTOIN <=16 SENSITIVE Sensitive     TRIMETH/SULFA >=320 RESISTANT Resistant     AMPICILLIN/SULBACTAM 16 INTERMEDIATE Intermediate     PIP/TAZO <=4 SENSITIVE Sensitive     * >=100,000 COLONIES/mL ESCHERICHIA COLI  Urine culture     Status: Abnormal (Preliminary result)   Collection Time: 08/03/15  5:59 PM  Result Value Ref Range Status   Specimen Description URINE, RANDOM  Final   Special Requests NONE  Final   Culture >=100,000 COLONIES/mL ESCHERICHIA COLI (A)  Final   Report Status PENDING  Incomplete  MRSA PCR Screening     Status: None   Collection Time: 08/03/15  8:48 PM  Result Value Ref Range Status   MRSA by PCR NEGATIVE NEGATIVE Final    Comment:        The GeneXpert MRSA Assay (FDA approved for NASAL specimens only), is one component of a comprehensive MRSA colonization surveillance program. It is not intended to diagnose MRSA infection nor to guide or monitor treatment for MRSA infections.    Creatinine:  Recent Labs  07/29/15 1444 08/03/15 1600 08/04/15 0603  CREATININE 1.17 1.26* 1.04    Xrays: See report/chart Dictated  Impression/Assessment:  Left epdidymitis; continue iv antibiotics  Plan:  I actually recommend an ID consult to ask if there is an effective po medication to eventually send home on since macrodantin does penetrate epididymis; also they can comment on length of iv meds in this situation (allergies make other po meds difficult)  Please have patient see me as an outpatient; send home when OK with ID medicine  Narcisa Ganesh A 08/04/2015, 3:35 PM

## 2015-08-05 ENCOUNTER — Inpatient Hospital Stay (HOSPITAL_COMMUNITY): Payer: Medicare Other

## 2015-08-05 LAB — GLUCOSE, CAPILLARY
Glucose-Capillary: 100 mg/dL — ABNORMAL HIGH (ref 65–99)
Glucose-Capillary: 126 mg/dL — ABNORMAL HIGH (ref 65–99)
Glucose-Capillary: 123 mg/dL — ABNORMAL HIGH (ref 65–99)
Glucose-Capillary: 117 mg/dL — ABNORMAL HIGH (ref 65–99)

## 2015-08-05 LAB — CBC WITH DIFFERENTIAL/PLATELET
BASOS ABS: 0.1 10*3/uL (ref 0.0–0.1)
BASOS PCT: 1 %
EOS ABS: 0.2 10*3/uL (ref 0.0–0.7)
Eosinophils Relative: 4 %
HCT: 36.4 % — ABNORMAL LOW (ref 39.0–52.0)
Hemoglobin: 11 g/dL — ABNORMAL LOW (ref 13.0–17.0)
LYMPHS PCT: 46 %
Lymphs Abs: 2.5 10*3/uL (ref 0.7–4.0)
MCH: 27.2 pg (ref 26.0–34.0)
MCHC: 30.2 g/dL (ref 30.0–36.0)
MCV: 90.1 fL (ref 78.0–100.0)
Monocytes Absolute: 0.8 10*3/uL (ref 0.1–1.0)
Monocytes Relative: 14 %
NEUTROS PCT: 35 %
Neutro Abs: 1.9 10*3/uL (ref 1.7–7.7)
Platelets: 239 10*3/uL (ref 150–400)
RBC: 4.04 MIL/uL — ABNORMAL LOW (ref 4.22–5.81)
RDW: 14.8 % (ref 11.5–15.5)
WBC: 5.5 10*3/uL (ref 4.0–10.5)

## 2015-08-05 LAB — COMPREHENSIVE METABOLIC PANEL
ALBUMIN: 2.6 g/dL — AB (ref 3.5–5.0)
ALK PHOS: 44 U/L (ref 38–126)
ALT: 14 U/L — ABNORMAL LOW (ref 17–63)
ANION GAP: 4 — AB (ref 5–15)
AST: 17 U/L (ref 15–41)
BUN: 10 mg/dL (ref 6–20)
CO2: 27 mmol/L (ref 22–32)
Calcium: 8.8 mg/dL — ABNORMAL LOW (ref 8.9–10.3)
Chloride: 107 mmol/L (ref 101–111)
Creatinine, Ser: 0.95 mg/dL (ref 0.61–1.24)
GFR calc Af Amer: >60 mL/min (ref >60)
GFR calc non Af Amer: >60 mL/min (ref >60)
GLUCOSE: 120 mg/dL — AB (ref 65–99)
POTASSIUM: 4.6 mmol/L (ref 3.5–5.1)
SODIUM: 138 mmol/L (ref 135–145)
Total Bilirubin: 0.7 mg/dL (ref 0.3–1.2)
Total Protein: 5.3 g/dL — ABNORMAL LOW (ref 6.5–8.1)

## 2015-08-05 LAB — URINE CULTURE: Culture: >=100000 — AB

## 2015-08-05 NOTE — Progress Notes (Signed)
PROGRESS NOTE  [name] [id number] DOB: February 02, 1945 DOA: 08/03/2015 PCP: Octavio Graves, DO  HPI/Recap of past 24 hours:  Reported feeling better, less scrotal pain, report persistent lower abdominal pain  Assessment/Plan: Principal Problem:   Epididymitis Active Problems:   Osteoarthritis, knee   Type 2 diabetes mellitus (HCC)   Anxiety   BPH (benign prostatic hyperplasia)    left-sided epididymitis. Complex left-sided hydrocele : on imipenem, urology consulted input appreciated  MDRO UTI, currently on imipenem, I have discussed with ID dr Linus Salmons who recommended picc line placement for iv abx , picc placement, anticipate d/c home with iv abx and home health  Hypotension: required fluids boluses, better  Aki: cr 1.26 -1.04-0.95, better   Lower abdominal pain: CT ab/pel  pending  noninuslin dependent dm2, home oral meds held, on ssi here  H/o left eye myeloma, s/po surgical resection,  Reports not able to read or write at baseline  Code Status: full  Family Communication: patient   Disposition Plan: home 7/28 with picc and home health   Consultants:  Urology Dr Matilde Sprang    conversation with Infectious disease Dr Linus Salmons  Procedures:  picc line placement  Antibiotics:  imipenem   Objective: BP 116/71 (BP Location: Right Arm)   Pulse (!) 53   Temp 97.8 F (36.6 C)   Resp 18   Ht 5\' 11"  (1.803 m)   Wt 103.6 kg (228 lb 6.3 oz)   SpO2 96%   BMI 31.85 kg/m   Intake/Output Summary (Last 24 hours) at 08/05/15 1847 Last data filed at 08/05/15 1500  Gross per 24 hour  Intake              950 ml  Output              200 ml  Net              750 ml   Filed Weights   08/03/15 1325 08/03/15 1945  Weight: 105.7 kg (233 lb) 103.6 kg (228 lb 6.3 oz)    Exam:   General:  NAD, left eye blindness  Cardiovascular: RRR  Respiratory: CTABL  Abdomen: mild diffuse tender lower abdomen, no guarding, no rebound, Soft/ND, positive  BS  Musculoskeletal: No Edema  Neuro: aaox3  Urology: scrotal erythema and tenderness  Data Reviewed: Basic Metabolic Panel:  Recent Labs Lab 08/03/15 1600 08/04/15 0603 08/05/15 0337  NA 137 139 138  K 3.3* 3.6 4.6  CL 103 104 107  CO2 25 27 27   GLUCOSE 202* 118* 120*  BUN 16 16 10   CREATININE 1.26* 1.04 0.95  CALCIUM 10.0 9.0 8.8*   Liver Function Tests:  Recent Labs Lab 08/03/15 1600 08/04/15 0603 08/05/15 0337  AST 23 17 17   ALT 22 17 14*  ALKPHOS 57 45 44  BILITOT 0.6 0.5 0.7  PROT 7.2 5.6* 5.3*  ALBUMIN 3.7 2.8* 2.6*   No results for input(s): LIPASE, AMYLASE in the last 168 hours. No results for input(s): AMMONIA in the last 168 hours. CBC:  Recent Labs Lab 08/03/15 1600 08/04/15 0603 08/05/15 0337  WBC 7.6 6.9 5.5  NEUTROABS 4.6 3.1 1.9  HGB 13.9 11.4* 11.0*  HCT 43.4 36.6* 36.4*  MCV 87.7 88.6 90.1  PLT 256 223 239   Cardiac Enzymes:   No results for input(s): CKTOTAL, CKMB, CKMBINDEX, TROPONINI in the last 168 hours. BNP (last 3 results) No results for input(s): BNP in the last 8760 hours.  ProBNP (last 3 results) No  results for input(s): PROBNP in the last 8760 hours.  CBG:  Recent Labs Lab 08/04/15 1652 08/04/15 2211 08/05/15 0744 08/05/15 1209 08/05/15 1729  GLUCAP 121* 140* 100* 126* 123*    Recent Results (from the past 240 hour(s))  Urine culture     Status: Abnormal   Collection Time: 07/29/15  1:10 PM  Result Value Ref Range Status   Specimen Description URINE, RANDOM  Final   Special Requests none Normal  Final   Culture (A)  Final    >=100,000 COLONIES/mL ESCHERICHIA COLI Confirmed Extended Spectrum Beta-Lactamase Producer (ESBL)    Report Status 08/01/2015 FINAL  Final   Organism ID, Bacteria ESCHERICHIA COLI (A)  Final      Susceptibility   Escherichia coli - MIC*    AMPICILLIN >=32 RESISTANT Resistant     CEFAZOLIN >=64 RESISTANT Resistant     CEFTRIAXONE >=64 RESISTANT Resistant     CIPROFLOXACIN >=4  RESISTANT Resistant     GENTAMICIN <=1 SENSITIVE Sensitive     IMIPENEM <=0.25 SENSITIVE Sensitive     NITROFURANTOIN <=16 SENSITIVE Sensitive     TRIMETH/SULFA >=320 RESISTANT Resistant     AMPICILLIN/SULBACTAM 16 INTERMEDIATE Intermediate     PIP/TAZO <=4 SENSITIVE Sensitive     * >=100,000 COLONIES/mL ESCHERICHIA COLI  Blood culture (routine x 2)     Status: None (Preliminary result)   Collection Time: 08/03/15  4:00 PM  Result Value Ref Range Status   Specimen Description BLOOD LEFT ANTECUBITAL  Final   Special Requests BOTTLES DRAWN AEROBIC AND ANAEROBIC 5CC  Final   Culture NO GROWTH 2 DAYS  Final   Report Status PENDING  Incomplete  Blood culture (routine x 2)     Status: None (Preliminary result)   Collection Time: 08/03/15  4:32 PM  Result Value Ref Range Status   Specimen Description BLOOD RIGHT ANTECUBITAL  Final   Special Requests BOTTLES DRAWN AEROBIC AND ANAEROBIC 5CC  Final   Culture NO GROWTH 2 DAYS  Final   Report Status PENDING  Incomplete  Urine culture     Status: Abnormal   Collection Time: 08/03/15  5:59 PM  Result Value Ref Range Status   Specimen Description URINE, RANDOM  Final   Special Requests NONE  Final   Culture (A)  Final    >=100,000 COLONIES/mL ESCHERICHIA COLI Confirmed Extended Spectrum Beta-Lactamase Producer (ESBL)    Report Status 08/05/2015 FINAL  Final   Organism ID, Bacteria ESCHERICHIA COLI (A)  Final      Susceptibility   Escherichia coli - MIC*    AMPICILLIN >=32 RESISTANT Resistant     CEFAZOLIN >=64 RESISTANT Resistant     CEFTRIAXONE >=64 RESISTANT Resistant     CIPROFLOXACIN >=4 RESISTANT Resistant     GENTAMICIN <=1 SENSITIVE Sensitive     IMIPENEM <=0.25 SENSITIVE Sensitive     NITROFURANTOIN <=16 SENSITIVE Sensitive     TRIMETH/SULFA >=320 RESISTANT Resistant     AMPICILLIN/SULBACTAM 16 INTERMEDIATE Intermediate     PIP/TAZO <=4 SENSITIVE Sensitive     * >=100,000 COLONIES/mL ESCHERICHIA COLI  MRSA PCR Screening      Status: None   Collection Time: 08/03/15  8:48 PM  Result Value Ref Range Status   MRSA by PCR NEGATIVE NEGATIVE Final    Comment:        The GeneXpert MRSA Assay (FDA approved for NASAL specimens only), is one component of a comprehensive MRSA colonization surveillance program. It is not intended to diagnose MRSA infection nor to  guide or monitor treatment for MRSA infections.      Studies: Ct Abdomen Pelvis Wo Contrast  Result Date: 08/05/2015 CLINICAL DATA:  No oral or iv contrast, h/o contrast allergy , abd pain Hx of diabetes, ca, GERD, UTI , decreased appetite EXAM: CT ABDOMEN AND PELVIS WITHOUT CONTRAST TECHNIQUE: Multidetector CT imaging of the abdomen and pelvis was performed following the standard protocol without IV contrast. COMPARISON:  Prior studies a 06/19/2012, 09/11/2013, 08/27/2013. FINDINGS: Lower chest: No pulmonary nodules, pleural effusions, or infiltrates. Heart size is normal. No imaged pericardial effusion or significant coronary artery calcifications. Hepatobiliary: No focal abnormality identified within the liver. The gallbladder is present. Pancreas: Unremarkable. Spleen: Normal in appearance. Renal/Adrenal: Adrenal glands are normal in appearance. Intrarenal calculi are identified in the kidneys bilaterally, measuring 1-2 mm in diameter. No hydronephrosis or ureteral obstruction. Gastrointestinal tract: The stomach and small bowel loops are normal in appearance. Numerous colonic diverticula are present. No CT evidence for acute diverticulitis. The appendix is well seen and has a normal appearance. Reproductive/Pelvis: The prostate is enlarged. There is mild. Prostatic fat stranding, suggesting inflammation or infection. No pelvic adenopathy or free pelvic fluid. Vascular/Lymphatic: Mild atherosclerotic calcification of the abdominal aorta. No retroperitoneal or mesenteric adenopathy. Musculoskeletal/Abdominal wall: Sclerotic lesion in the right iliac wing. Nonspecific  sclerotic focus within the L3 vertebral body. Other: none IMPRESSION: 1. Periprostatic fat stranding. Considerations include inflammatory infectious process or malignancy. Correlation with PSA is recommended. Cystitis or other bladder etiology could have a similar appearance but is felt to be less likely. 2. Bilateral nonobstructing small intrarenal calculi. 3. Colonic diverticulosis without acute inflammation. 4. Right iliac wing sclerotic lesion, most likely benign. Metastasis is difficult to exclude in light of the current findings however. Electronically Signed   By: Nolon Nations M.D.   On: 08/05/2015 17:11   Scheduled Meds: . brimonidine  1 drop Right Eye BID  . enoxaparin (LOVENOX) injection  40 mg Subcutaneous Q24H  . famotidine  20 mg Oral BID  . imipenem-cilastatin  500 mg Intravenous Q6H  . insulin aspart  0-9 Units Subcutaneous TID WC  . meloxicam  15 mg Oral Daily  . tamsulosin  0.4 mg Oral Daily    Continuous Infusions:     Time spent: 25mins  Jaquan Sadowsky MD, PhD  [facility] Pager [phone]. If 7PM-7AM, please contact night-coverage at [email or web address], password [facility] 08/05/2015, 6:47 PM  LOS: 2 days

## 2015-08-06 LAB — BASIC METABOLIC PANEL
Anion gap: 6 (ref 5–15)
BUN: 8 mg/dL (ref 6–20)
CALCIUM: 9.3 mg/dL (ref 8.9–10.3)
CHLORIDE: 105 mmol/L (ref 101–111)
CO2: 29 mmol/L (ref 22–32)
CREATININE: 0.93 mg/dL (ref 0.61–1.24)
GFR calc non Af Amer: >60 mL/min (ref >60)
Glucose, Bld: 116 mg/dL — ABNORMAL HIGH (ref 65–99)
POTASSIUM: 4.7 mmol/L (ref 3.5–5.1)
SODIUM: 140 mmol/L (ref 135–145)

## 2015-08-06 LAB — CBC
HCT: 38.9 % — ABNORMAL LOW (ref 39.0–52.0)
HEMOGLOBIN: 11.9 g/dL — AB (ref 13.0–17.0)
MCH: 27.6 pg (ref 26.0–34.0)
MCHC: 30.6 g/dL (ref 30.0–36.0)
MCV: 90.3 fL (ref 78.0–100.0)
Platelets: 301 10*3/uL (ref 150–400)
RBC: 4.31 MIL/uL (ref 4.22–5.81)
RDW: 14.7 % (ref 11.5–15.5)
WBC: 6.4 10*3/uL (ref 4.0–10.5)

## 2015-08-06 LAB — GLUCOSE, CAPILLARY
GLUCOSE-CAPILLARY: 156 mg/dL — AB (ref 65–99)
Glucose-Capillary: 114 mg/dL — ABNORMAL HIGH (ref 65–99)

## 2015-08-06 LAB — PROTIME-INR
INR: 1.14
PROTHROMBIN TIME: 14.7 s (ref 11.4–15.2)

## 2015-08-06 LAB — MAGNESIUM: MAGNESIUM: 1.7 mg/dL (ref 1.7–2.4)

## 2015-08-06 LAB — PSA: PSA: 8.66 ng/mL — AB (ref 0.00–4.00)

## 2015-08-06 MED ORDER — SODIUM CHLORIDE 0.9 % IV SOLN: 1.0000 g | INTRAVENOUS | 0 refills | Status: DC

## 2015-08-06 MED ORDER — FLORANEX PO PACK: 1.0000 g | PACK | Freq: Three times a day (TID) | ORAL | Status: DC

## 2015-08-06 MED ORDER — SODIUM CHLORIDE 0.9 % IV SOLN
1.0000 g | INTRAVENOUS | Status: DC
  Administered 2015-08-06: 1 g via INTRAVENOUS
  Filled 2015-08-06: qty 1

## 2015-08-06 MED ORDER — SODIUM CHLORIDE 0.9% FLUSH
10.0000 mL | INTRAVENOUS | Status: DC | PRN
  Administered 2015-08-06: 10 mL
  Filled 2015-08-06: qty 40

## 2015-08-06 MED ORDER — HEPARIN SOD (PORK) LOCK FLUSH 100 UNIT/ML IV SOLN
250.0000 [IU] | INTRAVENOUS | Status: AC | PRN
  Administered 2015-08-06: 250 [IU]

## 2015-08-06 MED ORDER — SODIUM CHLORIDE 0.9 % IV SOLN: 1.0000 g | INTRAVENOUS | 0 refills | Status: AC

## 2015-08-06 NOTE — Progress Notes (Signed)
Peripherally Inserted Central Catheter/Midline Placement  The IV Nurse has discussed with the patient and/or persons authorized to consent for the patient, the purpose of this procedure and the potential benefits and risks involved with this procedure.  The benefits include less needle sticks, lab draws from the catheter and patient may be discharged home with the catheter.  Risks include, but not limited to, infection, bleeding, blood clot (thrombus formation), and puncture of an artery; nerve damage and irregular heat beat.  Alternatives to this procedure were also discussed.  PICC/Midline Placement Documentation        Henderson [place] 08/06/2015, 11:14 AM Consent obained by Marianna Payment, RN

## 2015-08-06 NOTE — Discharge Summary (Signed)
Discharge Summary  [name] [id number] DOB: 14-Nov-1945  PCP: Octavio Graves, DO  Admit date: 08/03/2015 Discharge date: 08/06/2015  Time spent: >39mins  Recommendations for Outpatient Follow-up:  1. F/u with PMD within a week  for hospital discharge follow up, repeat cbc/bmp at follow up 2. F/u with urology Dr Matilde Sprang   Discharge Diagnoses:  Active Hospital Problems   Diagnosis Date Noted  . Epididymitis 08/03/2015  . Type 2 diabetes mellitus ([place]) 08/03/2015  . Anxiety 08/03/2015  . BPH (benign prostatic hyperplasia) 08/03/2015  . Osteoarthritis, knee 04/26/2011    Resolved Hospital Problems   Diagnosis Date Noted Date Resolved  No resolved problems to display.    Discharge Condition: stable  Diet recommendation: heart healthy/carb modified  Filed Weights   08/03/15 1325 08/03/15 1945  Weight: 105.7 kg (233 lb) 103.6 kg (228 lb 6.3 oz)    History of present illness:  Chief Complaint: Abnormal urine culture and sensitivity.  HPI: [name] is a 70 y.o. male with medical history significant of arthritis, left eye melanoma, type 2 diabetes, GERD who has been on outpatient treatment with oral ciprofloxacin for epididymitis comes to the hospital referred by his PCP after urine culture and sensitivity grew ESBL Escherichia coli resistant to fluoroquinolones, sensitive to gentamicin, Zosyn, nitrofurantoin and imipenem.  Per patient, his pain and swelling in the area has been slowly decreasing and he does not have any further dysuria and denies urethral discharge at any point, but left scrotum area continues to be tender. He denies fever, chills, but complains of mild fatigue, nausea, decreased appetite and decreased sleep due to pain.   ED Course: The patient was started on IV imipenem after the case was discussed by Dr. Wyvonnia Dusky with ID on call. Workup shows decrease WBC when compared to results from 5 days ago and mild hypokalemia at 3.3 mmol per  liter.  Hospital Course:  Principal Problem:   Epididymitis Active Problems:   Osteoarthritis, knee   Type 2 diabetes mellitus (HCC)   Anxiety   BPH (benign prostatic hyperplasia)   left-sided epididymitis. Complex left-sided hydrocele : on imipenem, urology consulted input appreciated, outpatient follow up recommended  MDRO UTI, currently on imipenem, I have discussed with ID dr Linus Salmons who recommended picc line placement for iv abx , picc placement, d/c home with invanz for additional 9 days,   Hypotension: required fluids boluses, resolved  Aki: cr 1.26 -1.04-0.95-0.93, resolved.   Lower abdominal pain:  CT ab/pel : 1. Periprostatic fat stranding. Considerations include inflammatory infectious process or malignancy. Correlation with PSA is recommended. Cystitis or other bladder etiology could have a similar appearance but is felt to be less likely. 2. Bilateral nonobstructing small intrarenal calculi. 3. Colonic diverticulosis without acute inflammation. 4. Right iliac wing sclerotic lesion, most likely benign. Metastasis is difficult to exclude in light of the current findings however.  psa elevated : 8.66, outpatient follow up with urology   noninuslin dependent dm2,on ssi here, home oral meds held in the hospital and resumed at discharge  H/o left eye myeloma, s/po surgical resection,  Reports not able to read or write at baseline  Code Status: full  Family Communication: patient   Disposition Plan: home 7/28 with picc and home health   Consultants:  Urology Dr Matilde Sprang    conversation with Infectious disease Dr Linus Salmons  Procedures:  picc line placement on 7/28  Antibiotics:  imipenem   Discharge Exam: BP 126/68 (BP Location: Right Arm)   Pulse 63  Temp 99.2 F (37.3 C) (Oral)   Resp 18   Ht 5\' 11"  (1.803 m)   Wt 103.6 kg (228 lb 6.3 oz)   SpO2 96%   BMI 31.85 kg/m     General:  NAD, left eye blindness  Cardiovascular:  RRR  Respiratory: CTABL  Abdomen: mild diffuse tender lower abdomen, no guarding, no rebound, Soft/ND, positive BS  Musculoskeletal: No Edema  Neuro: aaox3  Urology: scrotal erythema and tenderness is improving  Discharge Instructions You were cared for by a hospitalist during your hospital stay. If you have any questions about your discharge medications or the care you received while you were in the hospital after you are discharged, you can call the unit and asked to speak with the hospitalist on call if the hospitalist that took care of you is not available. Once you are discharged, your primary care physician will handle any further medical issues. Please note that NO REFILLS for any discharge medications will be authorized once you are discharged, as it is imperative that you return to your primary care physician (or establish a relationship with a primary care physician if you do not have one) for your aftercare needs so that they can reassess your need for medications and monitor your lab values.  Discharge Instructions    Diet - low sodium heart healthy    Complete by:  As directed   Card modified diet   Face-to-face encounter (required for Medicare/Medicaid patients)    Complete by:  As directed   I Franky Reier certify that this patient is under my care and that I, or a nurse practitioner or physician's assistant working with me, had a face-to-face encounter that meets the physician face-to-face encounter requirements with this patient on 08/06/2015. The encounter with the patient was in whole, or in part for the following medical condition(s) which is the primary reason for home health care (List medical condition): home iv abx infusion   The encounter with the patient was in whole, or in part, for the following medical condition, which is the primary reason for home health care:  home iv abx infusion   I certify that, based on my findings, the following services are medically necessary  home health services:  Nursing   Reason for Medically Necessary Home Health Services:  Skilled Nursing- Change/Decline in Patient Status   My clinical findings support the need for the above services:  OTHER SEE COMMENTS   Further, I certify that my clinical findings support that this patient is homebound due to:  Immunocompromised   Home Health    Complete by:  As directed   To provide the following care/treatments:  RN   Increase activity slowly    Complete by:  As directed       Medication List    STOP taking these medications   ciprofloxacin 500 MG tablet Commonly known as:  CIPRO     TAKE these medications   ALPRAZolam 1 MG tablet Commonly known as:  XANAX Take 1 mg by mouth 2 (two) times daily as needed. Anxiety   brimonidine 0.2 % ophthalmic solution Commonly known as:  ALPHAGAN Place 1 drop into the right eye 2 (two) times daily.   ertapenem 1 g in sodium chloride 0.9 % 50 mL Inject 1 g into the vein daily.   HYDROcodone-acetaminophen 5-325 MG tablet Commonly known as:  NORCO/VICODIN Take 2 tablets by mouth every 4 (four) hours as needed.   JANUVIA 100 MG tablet Generic drug:  sitaGLIPtin Take 100 mg by mouth daily.   meloxicam 15 MG tablet Commonly known as:  MOBIC Take 15 mg by mouth daily.   metFORMIN 500 MG tablet Commonly known as:  GLUCOPHAGE Take 500 mg by mouth 2 (two) times daily with a meal.   pioglitazone 45 MG tablet Commonly known as:  ACTOS Take 45 mg by mouth daily.   ranitidine 150 MG tablet Commonly known as:  ZANTAC Take 150 mg by mouth 2 (two) times daily.   tamsulosin 0.4 MG Caps capsule Commonly known as:  FLOMAX Take 1 capsule by mouth daily.      Allergies  Allergen Reactions  . Latex     blisters  . Iohexol      Code: RASH, Onset Date: [id number]   . Sulfa Antibiotics   . Penicillins Itching    PATIENT TOLERATED KEFLEX   Follow-up Information    CYNTHIA BUTLER, DO Follow up in 1 week(s).   Why:  hospital discharge  follow up Contact information: [address] [place] [address] [phone]        MACDIARMID,SCOTT A, MD Follow up in 2 week(s).   Specialty:  Urology Why:  epididymitis, elevated PSA Contact information: [place] [address] [phone]        [facility] .   Why:  For home health RN and IV Abx. Services will begin Saturday Contact information: [address] [place] [address] [phone]            The results of significant diagnostics from this hospitalization (including imaging, microbiology, ancillary and laboratory) are listed below for reference.    Significant Diagnostic Studies: Ct Abdomen Pelvis Wo Contrast  Result Date: 08/05/2015 CLINICAL DATA:  No oral or iv contrast, h/o contrast allergy , abd pain Hx of diabetes, ca, GERD, UTI , decreased appetite EXAM: CT ABDOMEN AND PELVIS WITHOUT CONTRAST TECHNIQUE: Multidetector CT imaging of the abdomen and pelvis was performed following the standard protocol without IV contrast. COMPARISON:  Prior studies a 06/19/2012, 09/11/2013, 08/27/2013. FINDINGS: Lower chest: No pulmonary nodules, pleural effusions, or infiltrates. Heart size is normal. No imaged pericardial effusion or significant coronary artery calcifications. Hepatobiliary: No focal abnormality identified within the liver. The gallbladder is present. Pancreas: Unremarkable. Spleen: Normal in appearance. Renal/Adrenal: Adrenal glands are normal in appearance. Intrarenal calculi are identified in the kidneys bilaterally, measuring 1-2 mm in diameter. No hydronephrosis or ureteral obstruction. Gastrointestinal tract: The stomach and small bowel loops are normal in appearance. Numerous colonic diverticula are present. No CT evidence for acute diverticulitis. The appendix is well seen and has a normal appearance. Reproductive/Pelvis: The prostate is enlarged. There is mild. Prostatic fat stranding, suggesting inflammation  or infection. No pelvic adenopathy or free pelvic fluid. Vascular/Lymphatic: Mild atherosclerotic calcification of the abdominal aorta. No retroperitoneal or mesenteric adenopathy. Musculoskeletal/Abdominal wall: Sclerotic lesion in the right iliac wing. Nonspecific sclerotic focus within the L3 vertebral body. Other: none IMPRESSION: 1. Periprostatic fat stranding. Considerations include inflammatory infectious process or malignancy. Correlation with PSA is recommended. Cystitis or other bladder etiology could have a similar appearance but is felt to be less likely. 2. Bilateral nonobstructing small intrarenal calculi. 3. Colonic diverticulosis without acute inflammation. 4. Right iliac wing sclerotic lesion, most likely benign. Metastasis is difficult to exclude in light of the current findings however. Electronically Signed   By: Nolon Nations M.D.   On: 08/05/2015 17:11  US Scrotum  Result Date: 08/03/2015 CLINICAL DATA:  Left-sided  testicular pain. EXAM: SCROTAL ULTRASOUND DOPPLER ULTRASOUND OF THE TESTICLES TECHNIQUE: Complete ultrasound examination of the testicles, epididymis, and other scrotal structures was performed. Color and spectral Doppler ultrasound were also utilized to evaluate blood flow to the testicles. COMPARISON:  Ultrasound of 07/29/2015 (which demonstrated left-sided epididymitis). FINDINGS: Right testicle Measurements: 4.0 x 2.0 x 2.5 cm. No mass or microlithiasis visualized. Left testicle Measurements: 3.6 x 3.0 x 2.8 cm. No mass or microlithiasis visualized. Right epididymis:  Within normal limits Left epididymis: Enlarged and edematous. 3.6 x 1.6 x 4.0 cm. Hypervascular, as evidenced by increased color Doppler signal. Hydrocele:  Left-sided hydrocele with complexity is moderate-sized. Varicocele:  None visualized. Pulsed Doppler interrogation of both testes demonstrates normal low resistance arterial and venous waveforms bilaterally. Left-sided skin thickening identified.  IMPRESSION: Redemonstration of left-sided epididymitis. Complex left-sided hydrocele is likely secondary. This is minimally enlarged since the prior exam. Electronically Signed   By: Abigail Miyamoto M.D.   On: 08/03/2015 17:47  US Scrotum  Result Date: 07/29/2015 CLINICAL DATA:  Scrotal swelling and pain for 3 days, worse on the left EXAM: SCROTAL ULTRASOUND DOPPLER ULTRASOUND OF THE TESTICLES TECHNIQUE: Complete ultrasound examination of the testicles, epididymis, and other scrotal structures was performed. Color and spectral Doppler ultrasound were also utilized to evaluate blood flow to the testicles. COMPARISON:  None. FINDINGS: Right testicle Measurements: 4.0 x 2.0 x 2.6 cm. No mass or microlithiasis visualized. Left testicle Measurements: 3.7 x 3.0 x 3.0 cm. No mass or microlithiasis visualized. Right epididymis:  Normal in size and appearance. Left epididymis: Heterogeneous, mildly enlarged with increased echogenicity and hypervascularity, compatible with epididymitis. Hydrocele:  Trace simple left hydrocele. Varicocele: Small right varicocele also noted, more pronounced with Valsalva. Pulsed Doppler interrogation of both testes demonstrates normal low resistance arterial and venous waveforms bilaterally. IMPRESSION: Ultrasound findings compatible with acute left epididymitis. Probable small reactive left hydrocele Incidental small right varicocele No acute testicular abnormality or torsion. Electronically Signed   By: Jerilynn Mages.  Shick M.D.   On: 07/29/2015 17:45   US Renal  Result Date: 08/03/2015 CLINICAL DATA:  Positive UTI with urinary retention EXAM: RENAL / URINARY TRACT ULTRASOUND COMPLETE COMPARISON:  None. FINDINGS: Right Kidney: Length: 11.7 cm. Echogenicity within normal limits. No mass or hydronephrosis visualized. Left Kidney: Length: 13.1 cm. Echogenicity within normal limits. No mass or hydronephrosis visualized. Bladder: Predominately decompressed IMPRESSION: No acute abnormality is noted in  the kidneys. Electronically Signed   By: Inez Catalina M.D.   On: 08/03/2015 17:40  [place] Art/ven Flow Abd Pelv Doppler  Result Date: 08/03/2015 CLINICAL DATA:  Left-sided testicular pain. EXAM: SCROTAL ULTRASOUND DOPPLER ULTRASOUND OF THE TESTICLES TECHNIQUE: Complete ultrasound examination of the testicles, epididymis, and other scrotal structures was performed. Color and spectral Doppler ultrasound were also utilized to evaluate blood flow to the testicles. COMPARISON:  Ultrasound of 07/29/2015 (which demonstrated left-sided epididymitis). FINDINGS: Right testicle Measurements: 4.0 x 2.0 x 2.5 cm. No mass or microlithiasis visualized. Left testicle Measurements: 3.6 x 3.0 x 2.8 cm. No mass or microlithiasis visualized. Right epididymis:  Within normal limits Left epididymis: Enlarged and edematous. 3.6 x 1.6 x 4.0 cm. Hypervascular, as evidenced by increased color Doppler signal. Hydrocele:  Left-sided hydrocele with complexity is moderate-sized. Varicocele:  None visualized. Pulsed Doppler interrogation of both testes demonstrates normal low resistance arterial and venous waveforms bilaterally. Left-sided skin thickening identified. IMPRESSION: Redemonstration of left-sided epididymitis. Complex left-sided hydrocele is likely secondary. This is minimally enlarged since the prior exam. Electronically Signed   By:  Abigail Miyamoto M.D.   On: 08/03/2015 17:47  [place] Art/ven Flow Abd Pelv Doppler  Result Date: 07/29/2015 CLINICAL DATA:  Scrotal swelling and pain for 3 days, worse on the left EXAM: SCROTAL ULTRASOUND DOPPLER ULTRASOUND OF THE TESTICLES TECHNIQUE: Complete ultrasound examination of the testicles, epididymis, and other scrotal structures was performed. Color and spectral Doppler ultrasound were also utilized to evaluate blood flow to the testicles. COMPARISON:  None. FINDINGS: Right testicle Measurements: 4.0 x 2.0 x 2.6 cm. No mass or microlithiasis visualized. Left testicle Measurements: 3.7 x 3.0 x 3.0  cm. No mass or microlithiasis visualized. Right epididymis:  Normal in size and appearance. Left epididymis: Heterogeneous, mildly enlarged with increased echogenicity and hypervascularity, compatible with epididymitis. Hydrocele:  Trace simple left hydrocele. Varicocele: Small right varicocele also noted, more pronounced with Valsalva. Pulsed Doppler interrogation of both testes demonstrates normal low resistance arterial and venous waveforms bilaterally. IMPRESSION: Ultrasound findings compatible with acute left epididymitis. Probable small reactive left hydrocele Incidental small right varicocele No acute testicular abnormality or torsion. Electronically Signed   By: Jerilynn Mages.  Shick M.D.   On: 07/29/2015 17:45    Microbiology: Recent Results (from the past 240 hour(s))  Urine culture     Status: Abnormal   Collection Time: 07/29/15  1:10 PM  Result Value Ref Range Status   Specimen Description URINE, RANDOM  Final   Special Requests none Normal  Final   Culture (A)  Final    >=100,000 COLONIES/mL ESCHERICHIA COLI Confirmed Extended Spectrum Beta-Lactamase Producer (ESBL)    Report Status 08/01/2015 FINAL  Final   Organism ID, Bacteria ESCHERICHIA COLI (A)  Final      Susceptibility   Escherichia coli - MIC*    AMPICILLIN >=32 RESISTANT Resistant     CEFAZOLIN >=64 RESISTANT Resistant     CEFTRIAXONE >=64 RESISTANT Resistant     CIPROFLOXACIN >=4 RESISTANT Resistant     GENTAMICIN <=1 SENSITIVE Sensitive     IMIPENEM <=0.25 SENSITIVE Sensitive     NITROFURANTOIN <=16 SENSITIVE Sensitive     TRIMETH/SULFA >=320 RESISTANT Resistant     AMPICILLIN/SULBACTAM 16 INTERMEDIATE Intermediate     PIP/TAZO <=4 SENSITIVE Sensitive     * >=100,000 COLONIES/mL ESCHERICHIA COLI  Blood culture (routine x 2)     Status: None (Preliminary result)   Collection Time: 08/03/15  4:00 PM  Result Value Ref Range Status   Specimen Description BLOOD LEFT ANTECUBITAL  Final   Special Requests BOTTLES DRAWN AEROBIC  AND ANAEROBIC 5CC  Final   Culture NO GROWTH 2 DAYS  Final   Report Status PENDING  Incomplete  Blood culture (routine x 2)     Status: None (Preliminary result)   Collection Time: 08/03/15  4:32 PM  Result Value Ref Range Status   Specimen Description BLOOD RIGHT ANTECUBITAL  Final   Special Requests BOTTLES DRAWN AEROBIC AND ANAEROBIC 5CC  Final   Culture NO GROWTH 2 DAYS  Final   Report Status PENDING  Incomplete  Urine culture     Status: Abnormal   Collection Time: 08/03/15  5:59 PM  Result Value Ref Range Status   Specimen Description URINE, RANDOM  Final   Special Requests NONE  Final   Culture (A)  Final    >=100,000 COLONIES/mL ESCHERICHIA COLI Confirmed Extended Spectrum Beta-Lactamase Producer (ESBL)    Report Status 08/05/2015 FINAL  Final   Organism ID, Bacteria ESCHERICHIA COLI (A)  Final      Susceptibility   Escherichia coli - MIC*  AMPICILLIN >=32 RESISTANT Resistant     CEFAZOLIN >=64 RESISTANT Resistant     CEFTRIAXONE >=64 RESISTANT Resistant     CIPROFLOXACIN >=4 RESISTANT Resistant     GENTAMICIN <=1 SENSITIVE Sensitive     IMIPENEM <=0.25 SENSITIVE Sensitive     NITROFURANTOIN <=16 SENSITIVE Sensitive     TRIMETH/SULFA >=320 RESISTANT Resistant     AMPICILLIN/SULBACTAM 16 INTERMEDIATE Intermediate     PIP/TAZO <=4 SENSITIVE Sensitive     * >=100,000 COLONIES/mL ESCHERICHIA COLI  MRSA PCR Screening     Status: None   Collection Time: 08/03/15  8:48 PM  Result Value Ref Range Status   MRSA by PCR NEGATIVE NEGATIVE Final    Comment:        The GeneXpert MRSA Assay (FDA approved for NASAL specimens only), is one component of a comprehensive MRSA colonization surveillance program. It is not intended to diagnose MRSA infection nor to guide or monitor treatment for MRSA infections.      Labs: Basic Metabolic Panel:  Recent Labs Lab 08/03/15 1600 08/04/15 0603 08/05/15 0337 08/06/15 0600  NA 137 139 138 140  K 3.3* 3.6 4.6 4.7  CL 103 104  107 105  CO2 25 27 27 29   GLUCOSE 202* 118* 120* 116*  BUN 16 16 10 8   CREATININE 1.26* 1.04 0.95 0.93  CALCIUM 10.0 9.0 8.8* 9.3  MG  --   --   --  1.7   Liver Function Tests:  Recent Labs Lab 08/03/15 1600 08/04/15 0603 08/05/15 0337  AST 23 17 17   ALT 22 17 14*  ALKPHOS 57 45 44  BILITOT 0.6 0.5 0.7  PROT 7.2 5.6* 5.3*  ALBUMIN 3.7 2.8* 2.6*   No results for input(s): LIPASE, AMYLASE in the last 168 hours. No results for input(s): AMMONIA in the last 168 hours. CBC:  Recent Labs Lab 08/03/15 1600 08/04/15 0603 08/05/15 0337 08/06/15 0600  WBC 7.6 6.9 5.5 6.4  NEUTROABS 4.6 3.1 1.9  --   HGB 13.9 11.4* 11.0* 11.9*  HCT 43.4 36.6* 36.4* 38.9*  MCV 87.7 88.6 90.1 90.3  PLT 256 223 239 301   Cardiac Enzymes: No results for input(s): CKTOTAL, CKMB, CKMBINDEX, TROPONINI in the last 168 hours. BNP: BNP (last 3 results) No results for input(s): BNP in the last 8760 hours.  ProBNP (last 3 results) No results for input(s): PROBNP in the last 8760 hours.  CBG:  Recent Labs Lab 08/05/15 1209 08/05/15 1729 08/05/15 2216 08/06/15 0753 08/06/15 1223  GLUCAP 126* 123* 117* 114* 156*       Signed:  Darnell Stimson MD, PhD  [facility] 08/06/2015, 1:07 PM

## 2015-08-06 NOTE — Progress Notes (Signed)
PICC line capped off, flushed with 10cc NS, GBR. [place] [employer]

## 2015-08-06 NOTE — Progress Notes (Signed)
[name] Areas to be D/C'd Home per MD order. Discussed with the patient and all questions fully answered.    Medication List    STOP taking these medications   ciprofloxacin 500 MG tablet Commonly known as:  CIPRO     TAKE these medications   ALPRAZolam 1 MG tablet Commonly known as:  XANAX Take 1 mg by mouth 2 (two) times daily as needed. Anxiety   brimonidine 0.2 % ophthalmic solution Commonly known as:  ALPHAGAN Place 1 drop into the right eye 2 (two) times daily.   ertapenem 1 g in sodium chloride 0.9 % 50 mL Inject 1 g into the vein daily. Start taking on:  08/07/2015   HYDROcodone-acetaminophen 5-325 MG tablet Commonly known as:  NORCO/VICODIN Take 2 tablets by mouth every 4 (four) hours as needed.   JANUVIA 100 MG tablet Generic drug:  sitaGLIPtin Take 100 mg by mouth daily.   meloxicam 15 MG tablet Commonly known as:  MOBIC Take 15 mg by mouth daily.   metFORMIN 500 MG tablet Commonly known as:  GLUCOPHAGE Take 500 mg by mouth 2 (two) times daily with a meal.   pioglitazone 45 MG tablet Commonly known as:  ACTOS Take 45 mg by mouth daily.   ranitidine 150 MG tablet Commonly known as:  ZANTAC Take 150 mg by mouth 2 (two) times daily.   tamsulosin 0.4 MG Caps capsule Commonly known as:  FLOMAX Take 1 capsule by mouth daily.       VVS, Skin clean, dry and intact without evidence of skin break down, no evidence of skin tears noted.  IV catheter discontinued intact. Site without signs and symptoms of complications. Dressing and pressure applied.  An After Visit Summary was printed and given to the patient.  Patient escorted via [place], and D/C home via private auto.  [name]  08/06/2015 3:52 PM

## 2015-08-06 NOTE — Care Management Note (Signed)
Case Management Note  Patient Details  Name: [name] MRN: [id number] Date of Birth: 04-16-1945  Subjective/Objective:              Spoke to patient in the room. He states that his PCP is Octavio Graves. He lives alone but can get help for once daily home IV Abx from his sister Odelia Gage, or a friend that can come by after he gets off works. He states that he can see from his one eye, he felt he would be able to manage IV with some assistance. Patient does not read or write, but states that both his sister and his friend do. Patient states that he has a home phone. CM encouraged patient to have both sister and friens available tomorrow when the RN teaches him about his home infusion. He would like to use [facility] for IV meds and HH RN. Patient will receive first dose of Invanz today and will need a home start tomorrow. Patient is receiving PICC line this morning.       Action/Plan:  Will DC to home with [facility] RN and IV Abx.  Expected Discharge Date:                  Expected Discharge Plan:  [place]  In-House Referral:     Discharge planning Services  CM Consult  Post Acute Care Choice:  Home Health, Durable Medical Equipment Choice offered to:  Patient  DME Arranged:  IV pump/equipment DME Agency:  [place]:  RN [facility] Agency:  [place]  Status of Service:  Completed, signed off  If discussed at [place] of Stay Meetings, dates discussed:    Additional Comments:  Carles Collet, RN 08/06/2015, 11:44 AM

## 2015-08-08 LAB — CULTURE, BLOOD (ROUTINE X 2)
Culture: NO GROWTH
Culture: NO GROWTH

## 2015-12-18 ENCOUNTER — Emergency Department (HOSPITAL_COMMUNITY)
Admission: EM | Admit: 2015-12-18 | Discharge: 2015-12-18 | Disposition: A | Discharge status: Home / Self Care | Payer: Medicare Other | Attending: Emergency Medicine | Admitting: Emergency Medicine

## 2015-12-18 ENCOUNTER — Emergency Department (HOSPITAL_COMMUNITY): Payer: Medicare Other

## 2015-12-18 ENCOUNTER — Encounter (HOSPITAL_COMMUNITY): Payer: Self-pay

## 2015-12-18 DIAGNOSIS — Z91040 Latex allergy status: Secondary | ICD-10-CM | POA: Insufficient documentation

## 2015-12-18 DIAGNOSIS — R5383 Other fatigue: Secondary | ICD-10-CM | POA: Insufficient documentation

## 2015-12-18 DIAGNOSIS — F17220 Nicotine dependence, chewing tobacco, uncomplicated: Secondary | ICD-10-CM | POA: Insufficient documentation

## 2015-12-18 DIAGNOSIS — R1032 Left lower quadrant pain: Secondary | ICD-10-CM | POA: Diagnosis not present

## 2015-12-18 DIAGNOSIS — E119 Type 2 diabetes mellitus without complications: Secondary | ICD-10-CM | POA: Insufficient documentation

## 2015-12-18 DIAGNOSIS — R531 Weakness: Secondary | ICD-10-CM | POA: Insufficient documentation

## 2015-12-18 DIAGNOSIS — Z85820 Personal history of malignant melanoma of skin: Secondary | ICD-10-CM | POA: Insufficient documentation

## 2015-12-18 DIAGNOSIS — Z79899 Other long term (current) drug therapy: Secondary | ICD-10-CM | POA: Diagnosis not present

## 2015-12-18 DIAGNOSIS — R251 Tremor, unspecified: Secondary | ICD-10-CM | POA: Diagnosis not present

## 2015-12-18 DIAGNOSIS — Z7984 Long term (current) use of oral hypoglycemic drugs: Secondary | ICD-10-CM | POA: Insufficient documentation

## 2015-12-18 LAB — CBC WITH DIFFERENTIAL/PLATELET
BASOS ABS: 0.1 10*3/uL (ref 0.0–0.1)
BASOS PCT: 1 %
EOS ABS: 0 10*3/uL (ref 0.0–0.7)
Eosinophils Relative: 0 %
HCT: 44.6 % (ref 39.0–52.0)
HEMOGLOBIN: 14.3 g/dL (ref 13.0–17.0)
LYMPHS ABS: 1.4 10*3/uL (ref 0.7–4.0)
Lymphocytes Relative: 24 %
MCH: 28.6 pg (ref 26.0–34.0)
MCHC: 32.1 g/dL (ref 30.0–36.0)
MCV: 89.2 fL (ref 78.0–100.0)
Monocytes Absolute: 0.6 10*3/uL (ref 0.1–1.0)
Monocytes Relative: 10 %
NEUTROS PCT: 65 %
Neutro Abs: 3.8 10*3/uL (ref 1.7–7.7)
PLATELETS: 268 10*3/uL (ref 150–400)
RBC: 5 MIL/uL (ref 4.22–5.81)
RDW: 16.1 % — ABNORMAL HIGH (ref 11.5–15.5)
WBC: 5.9 10*3/uL (ref 4.0–10.5)

## 2015-12-18 LAB — TROPONIN I: Troponin I: <0.03 ng/mL (ref <0.03)

## 2015-12-18 LAB — URINALYSIS, ROUTINE W REFLEX MICROSCOPIC
BILIRUBIN URINE: NEGATIVE
Glucose, UA: NEGATIVE mg/dL
HGB URINE DIPSTICK: NEGATIVE
Ketones, ur: 5 mg/dL — AB
Leukocytes, UA: NEGATIVE
Nitrite: NEGATIVE
PH: 6 (ref 5.0–8.0)
Protein, ur: NEGATIVE mg/dL
SPECIFIC GRAVITY, URINE: 1.018 (ref 1.005–1.030)

## 2015-12-18 LAB — COMPREHENSIVE METABOLIC PANEL
ALBUMIN: 4.4 g/dL (ref 3.5–5.0)
ALK PHOS: 57 U/L (ref 38–126)
ALT: 17 U/L (ref 17–63)
AST: 22 U/L (ref 15–41)
Anion gap: 8 (ref 5–15)
BUN: 15 mg/dL (ref 6–20)
CALCIUM: 9.8 mg/dL (ref 8.9–10.3)
CHLORIDE: 103 mmol/L (ref 101–111)
CO2: 27 mmol/L (ref 22–32)
CREATININE: 1.09 mg/dL (ref 0.61–1.24)
GFR calc Af Amer: >60 mL/min (ref >60)
GFR calc non Af Amer: >60 mL/min (ref >60)
GLUCOSE: 135 mg/dL — AB (ref 65–99)
Potassium: 3.5 mmol/L (ref 3.5–5.1)
SODIUM: 138 mmol/L (ref 135–145)
Total Bilirubin: 1 mg/dL (ref 0.3–1.2)
Total Protein: 7.5 g/dL (ref 6.5–8.1)

## 2015-12-18 LAB — LIPASE, BLOOD: LIPASE: 28 U/L (ref 11–51)

## 2015-12-18 MED ORDER — SODIUM CHLORIDE 0.9 % IV BOLUS (SEPSIS)
500.0000 mL | Freq: Once | INTRAVENOUS | Status: AC
  Administered 2015-12-18: 500 mL via INTRAVENOUS

## 2015-12-18 NOTE — ED Provider Notes (Signed)
[place] DEPT Provider Note   CSN: [id number] Arrival date & time: 12/18/15  1340     History   Chief Complaint Chief Complaint  Patient presents with  . Weakness    HPI [name] is a 70 y.o. male.  HPI Patient has had 4 days of generalized weakness and malaise associated with left lower quadrant pain. Also states he's been increasingly tremulous. Denies fever. No nausea vomiting or diarrhea. No blood in stool or melena. Denies any urinary symptoms including dysuria, frequency or urgency. Past Medical History:  Diagnosis Date  . Arthritis   . Cancer (HCC)    L EYE - MELANOMA  . Diabetes mellitus    fasting cbg 90-100  . GERD (gastroesophageal reflux disease)     Patient Active Problem List   Diagnosis Date Noted  . Epididymitis 08/03/2015  . Type 2 diabetes mellitus ([place]) 08/03/2015  . Anxiety 08/03/2015  . BPH (benign prostatic hyperplasia) 08/03/2015  . Herniated lumbar disc without myelopathy 09/11/2013  . Abscess of left thigh 11/18/2011  . Acute blood loss anemia 04/28/2011  . Osteoarthritis, knee 04/26/2011    Past Surgical History:  Procedure Laterality Date  . ENUCLEATION  25 YRS AGO    LEFT EYE DUE TO CANCER  . EYE SURGERY     RETINA SURG RT EYE  . I&D EXTREMITY  11/18/2011   Procedure: IRRIGATION AND DEBRIDEMENT EXTREMITY;  Surgeon: Tobi Bastos, MD;  Location: [facility];  Service: Orthopedics;  Laterality: Left;  I & D Left Thigh (left knee and inner thigh)  . KNEE ARTHROSCOPY  08/2010  . LUMBAR LAMINECTOMY/DECOMPRESSION MICRODISCECTOMY Left 09/11/2013   Procedure: Left Lumbar Four-Five  Microdiskectomy;  Surgeon: Erline Levine, MD;  Location: [facility];  Service: Neurosurgery;  Laterality: Left;  Left  Lumbar Four-Five  Microdiskectomy  . TOTAL KNEE ARTHROPLASTY  04/26/2011   Procedure: TOTAL KNEE ARTHROPLASTY;  Surgeon: Tobi Bastos, MD;  Location: [facility];  Service: Orthopedics;  Laterality: Left;  . 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 Authorizing Provider  ALPRAZolam Duanne Moron) 1 MG tablet Take 1 mg by mouth 2 (two) times daily as needed. Anxiety   Yes Historical Provider, MD  brimonidine (ALPHAGAN) 0.2 % ophthalmic solution Place 1 drop into the right eye 2 (two) times daily.   Yes Historical Provider, MD  HYDROcodone-acetaminophen (NORCO) 10-325 MG tablet Take 1 tablet by mouth every 6 (six) hours as needed. 12/08/15  Yes Historical Provider, MD  JANUVIA 100 MG tablet Take 100 mg by mouth daily. 07/10/15  Yes Historical Provider, MD  meloxicam (MOBIC) 15 MG tablet Take 15 mg by mouth daily. 12/01/15  Yes Historical Provider, MD  metFORMIN (GLUCOPHAGE) 500 MG tablet Take 500 mg by mouth 2 (two) times daily with a meal.   Yes Historical Provider, MD  pioglitazone (ACTOS) 45 MG tablet Take 45 mg by mouth daily. 07/10/15  Yes Historical Provider, MD  tamsulosin (FLOMAX) 0.4 MG CAPS capsule Take 1 capsule by mouth daily. 06/08/15  Yes Historical Provider, MD  HYDROcodone-acetaminophen (NORCO/VICODIN) 5-325 MG tablet Take 2 tablets by mouth every 4 (four) hours as needed. Patient not taking: Reported on 12/18/2015 07/29/15   Tanna Furry, MD  ranitidine (ZANTAC) 150 MG tablet Take 150 mg by mouth 2 (two) times daily.    Historical Provider, MD  Family History No family history on file.  Social History Social History  Substance Use Topics  . Smoking status: Never Smoker  . Smokeless tobacco: Current User    Types: Chew  . Alcohol use Yes     Comment: OCCASIONAL     Allergies   Latex; Iohexol; Sulfa antibiotics; and Penicillins   Review of Systems Review of Systems  Constitutional: Positive for fatigue. Negative for chills and fever.  HENT: Negative for rhinorrhea and sore throat.   Respiratory: Negative for cough and shortness of breath.   Cardiovascular: Negative for chest pain, palpitations and leg  swelling.  Gastrointestinal: Positive for abdominal pain. Negative for diarrhea, nausea and vomiting.  Genitourinary: Negative for dysuria, flank pain, frequency and hematuria.  Musculoskeletal: Negative for back pain, myalgias, neck pain and neck stiffness.  Skin: Negative for rash and wound.  Neurological: Positive for tremors and weakness (generalized). Negative for dizziness, light-headedness, numbness and headaches.  All other systems reviewed and are negative.    Physical Exam Updated Vital Signs BP 103/82   Pulse 79   Temp 98.2 F (36.8 C) (Oral)   Resp 11   Ht 5\' 11"  (1.803 m)   Wt 228 lb (103.4 kg)   SpO2 95%   BMI 31.80 kg/m   Physical Exam  Constitutional: He is oriented to person, place, and time. He appears well-developed and well-nourished. No distress.  HENT:  Head: Normocephalic and atraumatic.  Mouth/Throat: Oropharynx is clear and moist. No oropharyngeal exudate.  Eyes: EOM are normal. Pupils are equal, round, and reactive to light.  Left eye ptosis with artificial globe. Unchanged per patient  Neck: Normal range of motion. Neck supple.  Cardiovascular: Normal rate and regular rhythm.  Exam reveals no gallop and no friction rub.   No murmur heard. Pulmonary/Chest: Effort normal and breath sounds normal. No respiratory distress. He has no wheezes. He has no rales. He exhibits no tenderness.  Abdominal: Soft. Bowel sounds are normal. There is tenderness. There is no rebound and no guarding.  Left lower quadrant tenderness with palpation. No rebound or guarding. No masses appreciated.  Musculoskeletal: Normal range of motion. He exhibits no edema or tenderness.  No lower extremity swelling, asymmetry or tenderness. Distal pulses are equal.  Neurological: He is alert and oriented to person, place, and time.  Mild bilateral hand tremor. 5/5 motor in all extremities. Sensation is fully intact.  Skin: Skin is warm and dry. Capillary refill takes less than 2 seconds.  No rash noted. He is not diaphoretic. No erythema.  Psychiatric: He has a normal mood and affect. His behavior is normal.  Nursing note and vitals reviewed.    ED Treatments / Results  Labs (all labs ordered are listed, but only abnormal results are displayed) Labs Reviewed  CBC WITH DIFFERENTIAL/PLATELET - Abnormal; Notable for the following:       Result Value   RDW 16.1 (*)    All other components within normal limits  COMPREHENSIVE METABOLIC PANEL - Abnormal; Notable for the following:    Glucose, Bld 135 (*)    All other components within normal limits  URINALYSIS, ROUTINE W REFLEX MICROSCOPIC - Abnormal; Notable for the following:    Ketones, ur 5 (*)    All other components within normal limits  TROPONIN I  LIPASE, BLOOD    EKG  EKG Interpretation  Date/Time:  Saturday December 18 2015 13:46:26 EST Ventricular Rate:  89 PR Interval:    QRS Duration: 111 QT Interval:  387  QTC Calculation: 471 R Axis:   45 Text Interpretation:  Sinus rhythm Borderline prolonged PR interval Borderline T abnormalities, anterior leads, new since last tracing Confirmed by KNAPP  MD-J, JON [phone]) on 12/18/2015 1:51:50 PM       Radiology Ct Abdomen Pelvis Wo Contrast  Result Date: 12/18/2015 CLINICAL DATA:  LEFT lower quadrant abdominal pain, increased generalized weakness over the past 4 days, chest pain, shaking, LEFT groin pain, diabetes mellitus, GERD, ulcers, history LEFT eye melanoma EXAM: CT ABDOMEN AND PELVIS WITHOUT CONTRAST TECHNIQUE: Multidetector CT imaging of the abdomen and pelvis was performed following the standard protocol without IV contrast. Sagittal and coronal MPR images reconstructed from axial data set. Oral contrast was not administered. COMPARISON:  08/05/2015, 04/02/2009 FINDINGS: Lower chest: Lung bases clear Hepatobiliary: Gallbladder and liver unremarkable Pancreas: Normal appearance Spleen: Normal appearance.  Small splenule adjacent to spleen. Adrenals/Urinary  Tract: Adrenal glands normal appearance. Tiny BILATERAL nonobstructing renal calculi. Exophytic cyst upper pole LEFT kidney 10 mm diameter image 25. Kidneys, ureters, and bladder otherwise normal appearance. Prostatic enlargement, gland measuring 5.8 x 4.8 x 6.5 cm (volume = 95 cm3). Stomach/Bowel: Normal appendix. Stomach decompressed. Small bowel loops unremarkable. Diverticulosis of descending and sigmoid colon without wall thickening or definite inflammatory changes to suggest acute diverticulitis. Remainder of colon unremarkable. Vascular/Lymphatic: Atherosclerotic calcifications aorta without aneurysm. No adenopathy. Reproductive: N/A Other: BILATERAL inguinal hernias containing fat slightly larger on RIGHT. No free air or free fluid. Musculoskeletal: Large sclerotic focus within anterior RIGHT iliac bone image 60 unchanged since 2011. Mild scattered degenerative disc disease changes lumbar spine. IMPRESSION: Tiny BILATERAL nonobstructing renal calculi. Significant prostatic enlargement. BILATERAL inguinal hernias containing fat. Distal colonic diverticulosis without evidence of diverticulitis. Electronically Signed   By: Lavonia Dana M.D.   On: 12/18/2015 16:16    Procedures Procedures (including critical care time)  Medications Ordered in ED Medications  sodium chloride 0.9 % bolus 500 mL (0 mLs Intravenous Stopped 12/18/15 1600)     Initial Impression / Assessment and Plan / ED Course  I have reviewed the triage vital signs and the nursing notes.  Pertinent labs & imaging results that were available during my care of the patient were reviewed by me and considered in my medical decision making (see chart for details).  Clinical Course    Patient states that he drinks alcohol only occasionally.  Workup is essentially negative. Discuss results with patient and patient's son. He's had stable vital signs in the emergency department. On further questioning patient has been weaning himself off  of Xanax for the last week. States he normally takes 3 mg daily and has been taking only 2 mg daily for the last week. Some of his symptoms maybe related to withdrawal. Advised to increase Xanax back to 3 mg until evaluated by his primary physician. Both son and patient understand the need to return immediately for any fever, increased confusion, vomiting or diarrhea, worsening abdominal pain or for any concerns. Final Clinical Impressions(s) / ED Diagnoses   Final diagnoses:  Generalized weakness    New Prescriptions Discharge Medication List as of 12/18/2015  5:29 PM       Julianne Rice, MD 12/18/15 1753

## 2015-12-18 NOTE — ED Notes (Signed)
cbg 156 per ems

## 2015-12-18 NOTE — ED Triage Notes (Signed)
Pt reports had some problems with ulcers a few months ago and reports hasn't fell well since.  Reports significant increase in generalized weakness for the past 4 days.  Reports it came on suddenly 4 days ago and was accompanied with chest pain.  Also reports some intermittent involuntary shaking for the past 4 days.  Denies n/v/d.  C/o pain in left groin that comes and goes.

## 2015-12-18 NOTE — ED Notes (Signed)
Patient transported to CT 

## 2015-12-20 ENCOUNTER — Emergency Department (HOSPITAL_COMMUNITY)
Admission: EM | Admit: 2015-12-20 | Discharge: 2015-12-20 | Disposition: A | Discharge status: Home / Self Care | Payer: Medicare Other | Attending: Emergency Medicine | Admitting: Emergency Medicine

## 2015-12-20 ENCOUNTER — Encounter (HOSPITAL_COMMUNITY): Payer: Self-pay | Admitting: *Deleted

## 2015-12-20 DIAGNOSIS — Z91040 Latex allergy status: Secondary | ICD-10-CM | POA: Insufficient documentation

## 2015-12-20 DIAGNOSIS — Z96652 Presence of left artificial knee joint: Secondary | ICD-10-CM | POA: Insufficient documentation

## 2015-12-20 DIAGNOSIS — Z79899 Other long term (current) drug therapy: Secondary | ICD-10-CM | POA: Diagnosis not present

## 2015-12-20 DIAGNOSIS — R1030 Lower abdominal pain, unspecified: Secondary | ICD-10-CM | POA: Diagnosis present

## 2015-12-20 DIAGNOSIS — Z7984 Long term (current) use of oral hypoglycemic drugs: Secondary | ICD-10-CM | POA: Diagnosis not present

## 2015-12-20 DIAGNOSIS — E119 Type 2 diabetes mellitus without complications: Secondary | ICD-10-CM | POA: Insufficient documentation

## 2015-12-20 DIAGNOSIS — Z85840 Personal history of malignant neoplasm of eye: Secondary | ICD-10-CM | POA: Diagnosis not present

## 2015-12-20 DIAGNOSIS — N451 Epididymitis: Secondary | ICD-10-CM | POA: Insufficient documentation

## 2015-12-20 DIAGNOSIS — R1084 Generalized abdominal pain: Secondary | ICD-10-CM

## 2015-12-20 LAB — URINALYSIS, ROUTINE W REFLEX MICROSCOPIC
BACTERIA UA: NONE SEEN
Bilirubin Urine: NEGATIVE
GLUCOSE, UA: NEGATIVE mg/dL
Hgb urine dipstick: NEGATIVE
KETONES UR: 20 mg/dL — AB
LEUKOCYTES UA: NEGATIVE
NITRITE: NEGATIVE
PH: 5 (ref 5.0–8.0)
Protein, ur: NEGATIVE mg/dL
SPECIFIC GRAVITY, URINE: 1.021 (ref 1.005–1.030)

## 2015-12-20 LAB — CBC
HEMATOCRIT: 44 % (ref 39.0–52.0)
HEMOGLOBIN: 14.6 g/dL (ref 13.0–17.0)
MCH: 28.7 pg (ref 26.0–34.0)
MCHC: 33.2 g/dL (ref 30.0–36.0)
MCV: 86.6 fL (ref 78.0–100.0)
Platelets: 298 10*3/uL (ref 150–400)
RBC: 5.08 MIL/uL (ref 4.22–5.81)
RDW: 16 % — AB (ref 11.5–15.5)
WBC: 7.7 10*3/uL (ref 4.0–10.5)

## 2015-12-20 LAB — COMPREHENSIVE METABOLIC PANEL
ALK PHOS: 50 U/L (ref 38–126)
ALT: 17 U/L (ref 17–63)
ANION GAP: 9 (ref 5–15)
AST: 21 U/L (ref 15–41)
Albumin: 4.4 g/dL (ref 3.5–5.0)
BILIRUBIN TOTAL: 1.3 mg/dL — AB (ref 0.3–1.2)
BUN: 12 mg/dL (ref 6–20)
CALCIUM: 10.4 mg/dL — AB (ref 8.9–10.3)
CO2: 25 mmol/L (ref 22–32)
Chloride: 102 mmol/L (ref 101–111)
Creatinine, Ser: 1.1 mg/dL (ref 0.61–1.24)
GFR calc Af Amer: >60 mL/min (ref >60)
Glucose, Bld: 121 mg/dL — ABNORMAL HIGH (ref 65–99)
POTASSIUM: 3.7 mmol/L (ref 3.5–5.1)
Sodium: 136 mmol/L (ref 135–145)
TOTAL PROTEIN: 7.6 g/dL (ref 6.5–8.1)

## 2015-12-20 LAB — LIPASE, BLOOD: Lipase: 22 U/L (ref 11–51)

## 2015-12-20 LAB — I-STAT TROPONIN, ED: Troponin i, poc: 0 ng/mL (ref 0.00–0.08)

## 2015-12-20 MED ORDER — RANITIDINE HCL 150 MG PO TABS: 150.0000 mg | ORAL_TABLET | Freq: Two times a day (BID) | ORAL | 0 refills | Status: DC

## 2015-12-20 MED ORDER — CIPROFLOXACIN HCL 500 MG PO TABS: 500.0000 mg | ORAL_TABLET | Freq: Two times a day (BID) | ORAL | 0 refills | Status: DC

## 2015-12-20 NOTE — ED Provider Notes (Signed)
[facility] DEPT Provider Note   CSN: [id number] Arrival date & time: 12/20/15  1252     History   Chief Complaint Chief Complaint  Patient presents with  . Chest Pain  . Abdominal Pain  . Groin Pain    HPI [name] is a 70 y.o. male.  Patient is a 70 year old male with past medical history of reflux, diabetes, and arthritis. He presents for evaluation of multiple complaints. He feels shaky, reports abdominal discomfort, testicle swelling and pain, and dark urine. He denies any fevers or chills. He was just seen at [facility] 2 days ago and underwent extensive workup. This revealed no obvious cause. He returns today not feeling any better and requesting an explanation for his symptoms.   The history is provided by the patient.  Chest Pain   This is a new problem. Episode onset: 2 weeks ago. The problem occurs constantly. The problem has been gradually worsening. The pain is moderate. The pain does not radiate. Associated symptoms include abdominal pain.  Abdominal Pain    Groin Pain  Associated symptoms include chest pain and abdominal pain.    Past Medical History:  Diagnosis Date  . Arthritis   . Cancer (HCC)    L EYE - MELANOMA  . Diabetes mellitus    fasting cbg 90-100  . GERD (gastroesophageal reflux disease)     Patient Active Problem List   Diagnosis Date Noted  . Epididymitis 08/03/2015  . Type 2 diabetes mellitus ([place]) 08/03/2015  . Anxiety 08/03/2015  . BPH (benign prostatic hyperplasia) 08/03/2015  . Herniated lumbar disc without myelopathy 09/11/2013  . Abscess of left thigh 11/18/2011  . Acute blood loss anemia 04/28/2011  . Osteoarthritis, knee 04/26/2011    Past Surgical History:  Procedure Laterality Date  . ENUCLEATION  25 YRS AGO    LEFT EYE DUE TO CANCER  . EYE SURGERY     RETINA SURG RT EYE  . I&D EXTREMITY  11/18/2011   Procedure: IRRIGATION AND DEBRIDEMENT EXTREMITY;  Surgeon: Tobi Bastos, MD;  Location: [facility];  Service: Orthopedics;  Laterality: Left;  I & D Left Thigh (left knee and inner thigh)  . KNEE ARTHROSCOPY  08/2010  . LUMBAR LAMINECTOMY/DECOMPRESSION MICRODISCECTOMY Left 09/11/2013   Procedure: Left Lumbar Four-Five  Microdiskectomy;  Surgeon: Erline Levine, MD;  Location: [facility];  Service: Neurosurgery;  Laterality: Left;  Left  Lumbar Four-Five  Microdiskectomy  . TOTAL KNEE ARTHROPLASTY  04/26/2011   Procedure: TOTAL KNEE ARTHROPLASTY;  Surgeon: Tobi Bastos, MD;  Location: [facility];  Service: Orthopedics;  Laterality: Left;  . 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 Authorizing Provider  ALPRAZolam Duanne Moron) 1 MG tablet Take 1 mg by mouth 2 (two) times daily as needed. Anxiety    Historical Provider, MD  brimonidine (ALPHAGAN) 0.2 % ophthalmic solution Place 1 drop into the right eye 2 (two) times daily.    Historical Provider, MD  HYDROcodone-acetaminophen (NORCO) 10-325 MG tablet Take 1 tablet by mouth every 6 (six) hours as needed. 12/08/15   Historical Provider, MD  HYDROcodone-acetaminophen (NORCO/VICODIN) 5-325 MG tablet Take 2 tablets by mouth every 4 (four) hours as needed. Patient not taking: Reported on 12/18/2015 07/29/15   Tanna Furry, MD  JANUVIA 100 MG tablet Take 100 mg by mouth daily. 07/10/15  Historical Provider, MD  meloxicam (MOBIC) 15 MG tablet Take 15 mg by mouth daily. 12/01/15   Historical Provider, MD  metFORMIN (GLUCOPHAGE) 500 MG tablet Take 500 mg by mouth 2 (two) times daily with a meal.    Historical Provider, MD  pioglitazone (ACTOS) 45 MG tablet Take 45 mg by mouth daily. 07/10/15   Historical Provider, MD  ranitidine (ZANTAC) 150 MG tablet Take 150 mg by mouth 2 (two) times daily.    Historical Provider, MD  tamsulosin (FLOMAX) 0.4 MG CAPS capsule Take 1 capsule by mouth daily. 06/08/15   Historical Provider, MD    Family History No family  history on file.  Social History Social History  Substance Use Topics  . Smoking status: Never Smoker  . Smokeless tobacco: Current User    Types: Chew  . Alcohol use Yes     Comment: OCCASIONAL     Allergies   Latex; Iohexol; Sulfa antibiotics; and Penicillins   Review of Systems Review of Systems  Cardiovascular: Positive for chest pain.  Gastrointestinal: Positive for abdominal pain.  All other systems reviewed and are negative.    Physical Exam Updated Vital Signs BP 128/75 (BP Location: Right Arm)   Pulse 82   Temp 98.1 F (36.7 C) (Oral)   Resp 18   Ht 5\' 11"  (1.803 m)   Wt 228 lb (103.4 kg)   SpO2 96%   BMI 31.80 kg/m   Physical Exam  Constitutional: He is oriented to person, place, and time. He appears well-developed and well-nourished. No distress.  HENT:  Head: Normocephalic and atraumatic.  Mouth/Throat: Oropharynx is clear and moist.  Eyes: EOM are normal. Pupils are equal, round, and reactive to light.  Neck: Normal range of motion. Neck supple.  Cardiovascular: Normal rate and regular rhythm.  Exam reveals no friction rub.   No murmur heard. Pulmonary/Chest: Effort normal and breath sounds normal. No respiratory distress. He has no wheezes. He has no rales.  Abdominal: Soft. Bowel sounds are normal. He exhibits no distension. There is no tenderness.  Genitourinary: Penis normal.  Genitourinary Comments: There is tenderness of both testicles. They're both freely mobile and I highly doubt torsion.  Musculoskeletal: Normal range of motion. He exhibits no edema.  Neurological: He is alert and oriented to person, place, and time. Coordination normal.  Skin: Skin is warm and dry. He is not diaphoretic.  Nursing note and vitals reviewed.    ED Treatments / Results  Labs (all labs ordered are listed, but only abnormal results are displayed) Labs Reviewed  COMPREHENSIVE METABOLIC PANEL - Abnormal; Notable for the following:       Result Value    Glucose, Bld 121 (*)    Calcium 10.4 (*)    Total Bilirubin 1.3 (*)    All other components within normal limits  CBC - Abnormal; Notable for the following:    RDW 16.0 (*)    All other components within normal limits  URINALYSIS, ROUTINE W REFLEX MICROSCOPIC - Abnormal; Notable for the following:    Color, Urine AMBER (*)    APPearance HAZY (*)    Ketones, ur 20 (*)    Squamous Epithelial / LPF 0-5 (*)    All other components within normal limits  LIPASE, BLOOD  I-STAT TROPOININ, ED    EKG  EKG Interpretation  Date/Time:  Monday December 20 2015 13:14:05 EST Ventricular Rate:  85 PR Interval:  196 QRS Duration: 110 QT Interval:  384 QTC Calculation: 456 R Axis:  21 Text Interpretation:  Normal sinus rhythm Incomplete right bundle branch block Borderline ECG Unchanged from 12/18/15 Confirmed by Karess Harner  MD, Laquinta Hazell ([id number]) on 12/20/2015 2:23:34 PM       Radiology Ct Abdomen Pelvis Wo Contrast  Result Date: 12/18/2015 CLINICAL DATA:  LEFT lower quadrant abdominal pain, increased generalized weakness over the past 4 days, chest pain, shaking, LEFT groin pain, diabetes mellitus, GERD, ulcers, history LEFT eye melanoma EXAM: CT ABDOMEN AND PELVIS WITHOUT CONTRAST TECHNIQUE: Multidetector CT imaging of the abdomen and pelvis was performed following the standard protocol without IV contrast. Sagittal and coronal MPR images reconstructed from axial data set. Oral contrast was not administered. COMPARISON:  08/05/2015, 04/02/2009 FINDINGS: Lower chest: Lung bases clear Hepatobiliary: Gallbladder and liver unremarkable Pancreas: Normal appearance Spleen: Normal appearance.  Small splenule adjacent to spleen. Adrenals/Urinary Tract: Adrenal glands normal appearance. Tiny BILATERAL nonobstructing renal calculi. Exophytic cyst upper pole LEFT kidney 10 mm diameter image 25. Kidneys, ureters, and bladder otherwise normal appearance. Prostatic enlargement, gland measuring 5.8 x 4.8 x 6.5 cm (volume  = 95 cm3). Stomach/Bowel: Normal appendix. Stomach decompressed. Small bowel loops unremarkable. Diverticulosis of descending and sigmoid colon without wall thickening or definite inflammatory changes to suggest acute diverticulitis. Remainder of colon unremarkable. Vascular/Lymphatic: Atherosclerotic calcifications aorta without aneurysm. No adenopathy. Reproductive: N/A Other: BILATERAL inguinal hernias containing fat slightly larger on RIGHT. No free air or free fluid. Musculoskeletal: Large sclerotic focus within anterior RIGHT iliac bone image 60 unchanged since 2011. Mild scattered degenerative disc disease changes lumbar spine. IMPRESSION: Tiny BILATERAL nonobstructing renal calculi. Significant prostatic enlargement. BILATERAL inguinal hernias containing fat. Distal colonic diverticulosis without evidence of diverticulitis. Electronically Signed   By: Lavonia Dana M.D.   On: 12/18/2015 16:16    Procedures Procedures (including critical care time)  Medications Ordered in ED Medications - No data to display   Initial Impression / Assessment and Plan / ED Course  I have reviewed the triage vital signs and the nursing notes.  Pertinent labs & imaging results that were available during my care of the patient were reviewed by me and considered in my medical decision making (see chart for details).  Clinical Course     Workup reveals no obvious abnormality. His chart from 2 [facility] at [facility] was reviewed and I see no reason to repeat imaging studies. He does report stopping his reflux medication 2 weeks ago just prior to the onset of his symptoms of abdominal pain. I will restart this medication. I will also treat him with Cipro for presumed epididymitis. He will be discharged home, follow-up with his primary doctor.  Final Clinical Impressions(s) / ED Diagnoses   Final diagnoses:  None    New Prescriptions New Prescriptions   No medications on file     Veryl Speak, MD 12/20/15  1504

## 2015-12-20 NOTE — Discharge Instructions (Signed)
Cipro as prescribed.  Intact as prescribed.  Home with your primary Dr. in the next 3 days, and return to the ER if symptoms significantly worsen or change.

## 2015-12-20 NOTE — ED Triage Notes (Signed)
Pt presents with multiple complaints.  States L sided chest pain, weakness, lower abdominal pain, L inguinal pain and red colored urine.  Symptoms began Sat and pt was seen in [place] on Sat.  However, symptoms are worse since last night.

## 2016-09-14 ENCOUNTER — Ambulatory Visit (HOSPITAL_COMMUNITY)
Admission: RE | Admit: 2016-09-14 | Discharge: 2016-09-14 | Disposition: A | Discharge status: Home / Self Care | Payer: Medicare Other | Source: Ambulatory Visit | Attending: *Deleted | Admitting: *Deleted

## 2016-09-14 ENCOUNTER — Other Ambulatory Visit (HOSPITAL_COMMUNITY): Payer: Self-pay | Admitting: *Deleted

## 2016-09-14 DIAGNOSIS — S92402B Displaced unspecified fracture of left great toe, initial encounter for open fracture: Secondary | ICD-10-CM

## 2016-09-14 DIAGNOSIS — M79675 Pain in left toe(s): Secondary | ICD-10-CM | POA: Diagnosis not present

## 2019-03-21 ENCOUNTER — Telehealth: Payer: Self-pay | Admitting: Orthopedic Surgery

## 2019-03-21 NOTE — Telephone Encounter (Signed)
Patient and family member [name], called to ask if a referral has reached [place] from [employer]. Said for knee problem. Relayed we have not yet received a referral, and upon further conversation, patient is also being referred to a urologist. I did contact [place] (formerly [facility] office) - front office person will message the provider and ask if he can send a referral. Patient understands we will be happy to schedule once we have further information.

## 2019-05-12 ENCOUNTER — Ambulatory Visit (INDEPENDENT_AMBULATORY_CARE_PROVIDER_SITE_OTHER): Payer: Medicare Other | Admitting: Urology

## 2019-05-12 ENCOUNTER — Encounter: Payer: Self-pay | Admitting: Urology

## 2019-05-12 ENCOUNTER — Other Ambulatory Visit: Payer: Self-pay

## 2019-05-12 VITALS — BP 173/87 | HR 89 | Temp 97.7°F | Ht 68.0 in | Wt 260.0 lb

## 2019-05-12 DIAGNOSIS — N5082 Scrotal pain: Secondary | ICD-10-CM

## 2019-05-12 LAB — POCT URINALYSIS DIPSTICK
Bilirubin, UA: NEGATIVE
Blood, UA: NEGATIVE
Glucose, UA: NEGATIVE
Ketones, UA: NEGATIVE
Leukocytes, UA: NEGATIVE
Nitrite, UA: NEGATIVE
Protein, UA: NEGATIVE
Spec Grav, UA: 1.02 (ref 1.010–1.025)
Urobilinogen, UA: 0.2 E.U./dL
pH, UA: 5 (ref 5.0–8.0)

## 2019-05-12 LAB — BLADDER SCAN AMB NON-IMAGING: Scan Result: 43.5

## 2019-05-12 MED ORDER — DOXYCYCLINE HYCLATE 100 MG PO CAPS: 100.0000 mg | ORAL_CAPSULE | Freq: Two times a day (BID) | ORAL | 0 refills | Status: DC

## 2019-05-12 NOTE — Progress Notes (Signed)
Urological Symptom Review  Patient is experiencing the following symptoms: Frequent urination Hard to postpone urination Get up at night to urinate Leakage of urine   Review of Systems  Gastrointestinal (upper)  : Nausea  Gastrointestinal (lower) : Negative for lower GI symptoms  Constitutional : Negative for symptoms  Skin: Negative for skin symptoms  Eyes: Blurred vision  Ear/Nose/Throat : Negative for Ear/Nose/Throat symptoms  Hematologic/Lymphatic: Bleed easily  Cardiovascular : Negative for cardiovascular symptoms  Respiratory : Negative for respiratory symptoms  Endocrine: Negative for endocrine symptoms  Musculoskeletal: Back pain Joint pain  Neurological: Dizziness  Psychologic: Negative for psychiatric symptoms

## 2019-05-12 NOTE — Patient Instructions (Signed)
Epididymitis  Epididymitis is swelling (inflammation) or infection of the epididymis. The epididymis is a cord-like structure that is located along the top and back part of the testicle. It collects and stores sperm from the testicle. This condition can also cause pain and swelling of the testicle and scrotum. Symptoms usually start suddenly (acute epididymitis). Sometimes epididymitis starts gradually and lasts for a while (chronic epididymitis). This type may be harder to treat. What are the causes? In men ages 20-40, this condition is usually caused by a bacterial infection or a sexually transmitted disease (STD), such as:  Gonorrhea.  Chlamydia. In men 40 and older who do not have anal sex, this condition is usually caused by bacteria from a blockage or from abnormalities in the urinary system. These can result from:  Having a tube placed into the bladder (urinary catheter).  Having an enlarged or inflamed prostate gland.  Having recently had urinary tract surgery.  Having a problem with a backward flow of urine (retrograde). In men who have a condition that weakens the body's defense system (immune system), such as HIV, this condition can be caused by:  Other bacteria, including tuberculosis and syphilis.  Viruses.  Fungi. Sometimes this condition occurs without infection. This may happen because of trauma or repetitive activities such as sports. What increases the risk? You are more likely to develop this condition if you have:  Unprotected sex with more than one partner.  Anal sex.  Recently had surgery.  A urinary catheter.  Urinary problems.  A suppressed immune system. What are the signs or symptoms? This condition usually begins suddenly with chills, fever, and pain behind the scrotum and in the testicle. Other symptoms include:  Swelling of the scrotum, testicle, or both.  Pain when ejaculating or urinating.  Pain in the back or  abdomen.  Nausea.  Itching and discharge from the penis.  A frequent need to pass urine.  Redness, increased warmth, and tenderness of the scrotum. How is this diagnosed? Your health care provider can diagnose this condition based on your symptoms and medical history. Your health care provider will also do a physical exam to ask about your symptoms and check your scrotum and testicle for swelling, pain, and redness. You may also have other tests, including:  Examination of discharge from the penis.  Urine tests for infections, such as STDs.  Ultrasound test for blood flow and inflammation. Your health care provider may test you for other STDs, including HIV. How is this treated? Treatment for this condition depends on the cause. If your condition is caused by a bacterial infection, oral antibiotic medicine may be prescribed. If the bacterial infection has spread to your blood, you may need to receive IV antibiotics. For both bacterial and nonbacterial epididymitis, you may be treated with:  Rest.  Elevation of the scrotum.  Pain medicines.  Anti-inflammatory medicines. Surgery may be needed to treat:  Bacterial epididymitis that causes pus to build up in the scrotum (abscess).  Chronic epididymitis that has not responded to other treatments. Follow these instructions at home: Medicines  Take over-the-counter and prescription medicines only as told by your health care provider.  If you were prescribed an antibiotic medicine, take it as told by your health care provider. Do not stop taking the antibiotic even if your condition improves. Sexual activity  If your epididymitis was caused by an STD, avoid sexual activity until your treatment is complete.  Inform your sexual partner or partners if you test positive for   an STD. They may need to be treated. Do not engage in sexual activity with your partner or partners until their treatment is completed. Managing pain and  swelling   If directed, elevate your scrotum and apply ice. ? Put ice in a plastic bag. ? Place a small towel or pillow between your legs. ? Rest your scrotum on the pillow or towel. ? Place another towel between your skin and the plastic bag. ? Leave the ice on for 20 minutes, 2-3 times a day.  Try taking a sitz bath to help with discomfort. This is a warm water bath that is taken while you are sitting down. The water should only come up to your hips and should cover your buttocks. Do this 3-4 times per day or as told by your health care provider.  Keep your scrotum elevated and supported while resting. Ask your health care provider if you should wear a scrotal support, such as a jockstrap. Wear it as told by your health care provider. General instructions  Return to your normal activities as told by your health care provider. Ask your health care provider what activities are safe for you.  Drink enough fluid to keep your urine pale yellow.  Keep all follow-up visits as told by your health care provider. This is important. Contact a health care provider if:  You have a fever.  Your pain medicine is not helping.  Your pain is getting worse.  Your symptoms do not improve within 3 days. Summary  Epididymitis is swelling (inflammation) or infection of the epididymis. This condition can also cause pain and swelling of the testicle and scrotum.  Treatment for this condition depends on the cause. If your condition is caused by a bacterial infection, oral antibiotic medicine may be prescribed.  Inform your sexual partner or partners if you test positive for an STD. They may need to be treated. Do not engage in sexual activity with your partner or partners until their treatment is completed.  Contact a health care provider if your symptoms do not improve within 3 days. This information is not intended to replace advice given to you by your health care provider. Make sure you discuss any  questions you have with your health care provider. Document Revised: 10/29/2017 Document Reviewed: 10/30/2017 Elsevier Patient Education  2020 [employer]  

## 2019-05-12 NOTE — Progress Notes (Signed)
05/12/2019 2:59 PM   [name] [birth year] [id number]  Referring provider: [name], DO [address],  [place] [address]  Left scrotal pain  HPI: [name] is a 74yo here for evaluation for left scrotal pain and testis pain. He has a hx of scrotal abscess/epididimitis requiring IV antibiotics. He has intermittent drainage from his left hemiscrotum. The pain is dull constant mild and nonraditing. The issues has been present for over 5 years. NO exacerbating events. He has issues with nocturia and urinary urgency. He is currently on flomax 0.4mg  daily.  He has had UTIs in the past. No prostate infections   PMH: Past Medical History:  Diagnosis Date  . Acid reflux   . Arthritis   . Cancer (HCC)    L EYE - MELANOMA  . Diabetes mellitus    fasting cbg 90-100  . GERD (gastroesophageal reflux disease)   . Hypertension   . Stomach ulcer     Surgical History: Past Surgical History:  Procedure Laterality Date  . BACK SURGERY    . ENUCLEATION  25 YRS AGO    LEFT EYE DUE TO CANCER  . EYE SURGERY     RETINA SURG RT EYE  . I & D EXTREMITY  11/18/2011   Procedure: IRRIGATION AND DEBRIDEMENT EXTREMITY;  Surgeon: Tobi Bastos, MD;  Location: [facility];  Service: Orthopedics;  Laterality: Left;  I & D Left Thigh (left knee and inner thigh)  . KNEE ARTHROSCOPY  08/2010  . LUMBAR LAMINECTOMY/DECOMPRESSION MICRODISCECTOMY Left 09/11/2013   Procedure: Left Lumbar Four-Five  Microdiskectomy;  Surgeon: Erline Levine, MD;  Location: [facility];  Service: Neurosurgery;  Laterality: Left;  Left  Lumbar Four-Five  Microdiskectomy  . TOTAL KNEE ARTHROPLASTY  04/26/2011   Procedure: TOTAL KNEE ARTHROPLASTY;  Surgeon: Tobi Bastos, MD;  Location: [facility];  Service: Orthopedics;  Laterality: Left;  . TUMOR REMOVED     RT LEG  - FATTY TUMOR    Home Medications:  Allergies as of 05/12/2019      Reactions   Latex    blisters   Iohexol     Code: RASH, Onset Date: [id number]     Sulfa Antibiotics    Penicillins Itching   Has patient had a PCN reaction causing immediate rash, facial/tongue/throat swelling, SOB or lightheadedness with hypotension: Yes Has patient had a PCN reaction causing severe rash involving mucus membranes or skin necrosis: No Has patient had a PCN reaction that required hospitalization No Has patient had a PCN reaction occurring within the last 10 years: No If all of the above answers are "NO", then may proceed with Cephalosporin use. PATIENT TOLERATED [place]      Medication List       Accurate as of May 12, 2019  2:59 PM. If you have any questions, ask your nurse or doctor.        STOP taking these medications   ciprofloxacin 500 MG tablet Commonly known as: Cipro Stopped by: Nicolette Bang, MD     TAKE these medications   Accu-Chek Aviva Plus test strip Generic drug: glucose blood CHECK BLOOD SUGAR 2 TIMES A DAY   Accu-Chek Softclix Lancets lancets as directed 30 days1I   ALPRAZolam 1 MG tablet Commonly known as: XANAX Take 1 mg by mouth 2 (two) times daily as needed. Anxiety What changed: Another medication with the same name was removed. Continue taking this medication, and follow the directions you see here. Changed by: Saralyn Pilar  Karly Pitter, MD   ALPRAZolam 0.5 MG tablet Commonly known as: XANAX Take 0.5 mg by mouth every 6 (six) hours as needed. What changed: Another medication with the same name was removed. Continue taking this medication, and follow the directions you see here. Changed by: Nicolette Bang, MD   brimonidine 0.2 % ophthalmic solution Commonly known as: ALPHAGAN Place 1 drop into the right eye 2 (two) times daily.   HYDROcodone-acetaminophen 10-325 MG tablet Commonly known as: NORCO Take 1 tablet by mouth every 6 (six) hours as needed. What changed: Another medication with the same name was removed. Continue taking this medication, and follow the directions you see here. Changed by: Nicolette Bang, MD   Januvia 100 MG tablet Generic drug: sitaGLIPtin Take 100 mg by mouth daily. What changed: Another medication with the same name was removed. Continue taking this medication, and follow the directions you see here. Changed by: Nicolette Bang, MD   lactobacillus Pack Take by mouth.   latanoprost 0.005 % ophthalmic solution Commonly known as: XALATAN SMARTSIG:1 Drop(s) Right Eye Every Evening   meloxicam 15 MG tablet Commonly known as: MOBIC Take 15 mg by mouth daily.   metFORMIN 500 MG tablet Commonly known as: GLUCOPHAGE Take 500 mg by mouth 2 (two) times daily with a meal.   omeprazole 20 MG capsule Commonly known as: PRILOSEC   pioglitazone 45 MG tablet Commonly known as: ACTOS What changed: Another medication with the same name was removed. Continue taking this medication, and follow the directions you see here. Changed by: Nicolette Bang, MD   ramipril 10 MG capsule Commonly known as: ALTACE Take 10 mg by mouth daily.   ranitidine 150 MG tablet Commonly known as: ZANTAC Take 1 tablet (150 mg total) by mouth 2 (two) times daily.   tamsulosin 0.4 MG Caps capsule Commonly known as: FLOMAX Take 1 capsule by mouth daily.   tamsulosin 0.4 MG Caps capsule Commonly known as: FLOMAX       Allergies:  Allergies  Allergen Reactions  . Latex     blisters  . Iohexol      Code: RASH, Onset Date: [id number]   . Sulfa Antibiotics   . Penicillins Itching    Has patient had a PCN reaction causing immediate rash, facial/tongue/throat swelling, SOB or lightheadedness with hypotension: Yes Has patient had a PCN reaction causing severe rash involving mucus membranes or skin necrosis: No Has patient had a PCN reaction that required hospitalization No Has patient had a PCN reaction occurring within the last 10 years: No If all of the above answers are "NO", then may proceed with Cephalosporin use. PATIENT TOLERATED [place]    Family History: History  reviewed. No pertinent family history.  Social History:  reports that he has never smoked. His smokeless tobacco use includes chew. He reports current alcohol use. He reports that he does not use drugs.  ROS: All other review of systems were reviewed and are negative except what is noted above in HPI  Physical Exam: BP (!) 173/87   Pulse 89   Temp 97.7 F (36.5 C)   Ht 5\' 8"  (1.727 m)   Wt 260 lb (117.9 kg)   BMI 39.53 kg/m   Constitutional:  Alert and oriented, No acute distress. HEENT: [place] AT, moist mucus membranes.  Trachea midline, no masses. Cardiovascular: No clubbing, cyanosis, or edema. Respiratory: Normal respiratory effort, no increased work of breathing. GI: Abdomen is soft, nontender, nondistended, no abdominal masses GU: No CVA tenderness. Circumcised phallus.  No masses/lesions on penis, testis, scrotum. Left epidydimis indurated, tender. Prostate 40g smooth no nodules no induration.  Lymph: No cervical or inguinal lymphadenopathy. Skin: No rashes, bruises or suspicious lesions. Neurologic: Grossly intact, no focal deficits, moving all 4 extremities. Psychiatric: Normal mood and affect.  Laboratory Data: Lab Results  Component Value Date   WBC 7.7 12/20/2015   HGB 14.6 12/20/2015   HCT 44.0 12/20/2015   MCV 86.6 12/20/2015   PLT 298 12/20/2015    Lab Results  Component Value Date   CREATININE 1.10 12/20/2015    Lab Results  Component Value Date   PSA 8.66 (H) 08/06/2015    No results found for: TESTOSTERONE  Lab Results  Component Value Date   HGBA1C 6.2 (H) 09/11/2013    Urinalysis    Component Value Date/Time   COLORURINE AMBER (A) 12/20/2015 1332   APPEARANCEUR HAZY (A) 12/20/2015 1332   LABSPEC 1.021 12/20/2015 1332   PHURINE 5.0 12/20/2015 1332   GLUCOSEU NEGATIVE 12/20/2015 1332   HGBUR NEGATIVE 12/20/2015 1332   BILIRUBINUR neg 05/12/2019 1448   KETONESUR 20 (A) 12/20/2015 1332   PROTEINUR Negative 05/12/2019 1448   PROTEINUR  NEGATIVE 12/20/2015 1332   UROBILINOGEN 0.2 05/12/2019 1448   UROBILINOGEN 4.0 (H) 11/17/2011 2148   NITRITE neg 05/12/2019 1448   NITRITE NEGATIVE 12/20/2015 1332   LEUKOCYTESUR Negative 05/12/2019 1448    Lab Results  Component Value Date   BACTERIA NONE SEEN 12/20/2015    Pertinent Imaging:  No results found for this or any previous visit. No results found for this or any previous visit. No results found for this or any previous visit. No results found for this or any previous visit. Results for orders placed during the hospital encounter of 08/03/15  US Renal   Narrative CLINICAL DATA:  Positive UTI with urinary retention EXAM: RENAL / URINARY TRACT ULTRASOUND COMPLETE COMPARISON:  None. FINDINGS: Right Kidney: Length: 11.7 cm. Echogenicity within normal limits. No mass or hydronephrosis visualized. Left Kidney: Length: 13.1 cm. Echogenicity within normal limits. No mass or hydronephrosis visualized. Bladder: Predominately decompressed IMPRESSION: No acute abnormality is noted in the kidneys. Electronically Signed   By: Inez Catalina M.D.   On: 08/03/2015 17:40   No results found for this or any previous visit. No results found for this or any previous visit. No results found for this or any previous visit.  Assessment & Plan:    1. Scrotal pain Related to left epidydimitis. Doxycycline 100mg  BID for 14 days - POCT urinalysis dipstick   No follow-ups on file.  Nicolette Bang, MD  [facility] [place]

## 2019-05-19 ENCOUNTER — Telehealth: Payer: Self-pay | Admitting: Urology

## 2019-05-19 ENCOUNTER — Encounter: Payer: Self-pay | Admitting: Orthopedic Surgery

## 2019-05-19 ENCOUNTER — Other Ambulatory Visit: Payer: Self-pay

## 2019-05-19 DIAGNOSIS — N5082 Scrotal pain: Secondary | ICD-10-CM

## 2019-05-19 NOTE — Telephone Encounter (Signed)
Scheduling called and asked if order for US scrotum can be changed to US scrotum with doppler

## 2019-05-19 NOTE — Telephone Encounter (Signed)
completed

## 2019-06-11 ENCOUNTER — Ambulatory Visit (INDEPENDENT_AMBULATORY_CARE_PROVIDER_SITE_OTHER): Payer: Medicare Other | Admitting: Urology

## 2019-06-11 ENCOUNTER — Encounter: Payer: Self-pay | Admitting: Urology

## 2019-06-11 ENCOUNTER — Other Ambulatory Visit: Payer: Self-pay

## 2019-06-11 VITALS — BP 137/75 | HR 79 | Temp 98.1°F | Ht 68.0 in | Wt 269.0 lb

## 2019-06-11 DIAGNOSIS — N5082 Scrotal pain: Secondary | ICD-10-CM | POA: Diagnosis not present

## 2019-06-11 NOTE — Progress Notes (Signed)
06/11/2019 10:54 AM   [name] Sep 30, 1945 [id number]  Referring provider: Octavio Stevenson P, DO [address],  [place] [address]  Scrotal pain  HPI: [name] is a 74yo here for followup for scrotal pain. He took 2 weeks of doxycycline and the pain improved. NO induration or drainage from scrotum. No worsening LUTS. No other associated symtpoms   PMH: Past Medical History:  Diagnosis Date  . Acid reflux   . Arthritis   . Cancer (HCC)    L EYE - MELANOMA  . Diabetes mellitus    fasting cbg 90-100  . GERD (gastroesophageal reflux disease)   . Hypertension   . Stomach ulcer     Surgical History: Past Surgical History:  Procedure Laterality Date  . BACK SURGERY    . ENUCLEATION  25 YRS AGO    LEFT EYE DUE TO CANCER  . EYE SURGERY     RETINA SURG RT EYE  . I & D EXTREMITY  11/18/2011   Procedure: IRRIGATION AND DEBRIDEMENT EXTREMITY;  Surgeon: Don Bastos, MD;  Location: [facility];  Service: Orthopedics;  Laterality: Left;  I & D Left Thigh (left knee and inner thigh)  . KNEE ARTHROSCOPY  08/2010  . LUMBAR LAMINECTOMY/DECOMPRESSION MICRODISCECTOMY Left 09/11/2013   Procedure: Left Lumbar Four-Five  Microdiskectomy;  Surgeon: Don Levine, MD;  Location: [facility];  Service: Neurosurgery;  Laterality: Left;  Left  Lumbar Four-Five  Microdiskectomy  . TOTAL KNEE ARTHROPLASTY  04/26/2011   Procedure: TOTAL KNEE ARTHROPLASTY;  Surgeon: Don Bastos, MD;  Location: [facility];  Service: Orthopedics;  Laterality: Left;  . TUMOR REMOVED     RT LEG  - FATTY TUMOR    Home Medications:  Allergies as of 06/11/2019      Reactions   Latex    blisters   Sulfasalazine Hives   Iohexol     Code: RASH, Onset Date: [id number]   Sulfa Antibiotics    Penicillins Itching   Has patient had a PCN reaction causing immediate rash, facial/tongue/throat swelling, SOB or lightheadedness with hypotension: Yes Has patient had a PCN reaction causing severe rash involving mucus  membranes or skin necrosis: No Has patient had a PCN reaction that required hospitalization No Has patient had a PCN reaction occurring within the last 10 years: No If all of the above answers are "NO", then may proceed with Cephalosporin use. PATIENT TOLERATED [facility]      Medication List       Accurate as of June 11, 2019 10:54 AM. If you have any questions, ask your nurse or doctor.        Accu-Chek Aviva Plus test strip Generic drug: glucose blood CHECK BLOOD SUGAR 2 TIMES A DAY   Accu-Chek Softclix Lancets lancets as directed 30 days1I   ALPRAZolam 1 MG tablet Commonly known as: XANAX Take 1 mg by mouth 2 (two) times daily as needed. Anxiety   ALPRAZolam 0.5 MG tablet Commonly known as: XANAX Take 0.5 mg by mouth every 6 (six) hours as needed.   brimonidine 0.2 % ophthalmic solution Commonly known as: ALPHAGAN Place 1 drop into the right eye 2 (two) times daily.   doxycycline 100 MG capsule Commonly known as: VIBRAMYCIN Take 1 capsule (100 mg total) by mouth every 12 (twelve) hours.   HYDROcodone-acetaminophen 10-325 MG tablet Commonly known as: NORCO Take 1 tablet by mouth every 6 (six) hours as needed.   Januvia 100 MG tablet Generic drug: sitaGLIPtin Take  100 mg by mouth daily.   lactobacillus Pack Take by mouth.   latanoprost 0.005 % ophthalmic solution Commonly known as: XALATAN SMARTSIG:1 Drop(s) Right Eye Every Evening   meloxicam 15 MG tablet Commonly known as: MOBIC Take 15 mg by mouth daily.   metFORMIN 500 MG tablet Commonly known as: GLUCOPHAGE Take 500 mg by mouth 2 (two) times daily with a meal.   omeprazole 20 MG capsule Commonly known as: PRILOSEC   pioglitazone 45 MG tablet Commonly known as: ACTOS   ramipril 10 MG capsule Commonly known as: ALTACE Take 10 mg by mouth daily.   ranitidine 150 MG tablet Commonly known as: ZANTAC Take 1 tablet (150 mg total) by mouth 2 (two) times daily.   tamsulosin 0.4 MG Caps  capsule Commonly known as: FLOMAX Take 1 capsule by mouth daily.   tamsulosin 0.4 MG Caps capsule Commonly known as: FLOMAX       Allergies:  Allergies  Allergen Reactions  . Latex     blisters  . Sulfasalazine Hives  . Iohexol      Code: RASH, Onset Date: [id number]   . Sulfa Antibiotics   . Penicillins Itching    Has patient had a PCN reaction causing immediate rash, facial/tongue/throat swelling, SOB or lightheadedness with hypotension: Yes Has patient had a PCN reaction causing severe rash involving mucus membranes or skin necrosis: No Has patient had a PCN reaction that required hospitalization No Has patient had a PCN reaction occurring within the last 10 years: No If all of the above answers are "NO", then may proceed with Cephalosporin use. PATIENT TOLERATED [place]    Family History: History reviewed. No pertinent family history.  Social History:  reports that he has never smoked. His smokeless tobacco use includes chew. He reports current alcohol use. He reports that he does not use drugs.  ROS: All other review of systems were reviewed and are negative except what is noted above in HPI  Physical Exam: BP 137/75   Pulse 79   Temp 98.1 F (36.7 C)   Ht 5\' 8"  (1.727 m)   Wt 269 lb (122 kg)   BMI 40.90 kg/m   Constitutional:  Alert and oriented, No acute distress. HEENT: [place] AT, moist mucus membranes.  Trachea midline, no masses. Cardiovascular: No clubbing, cyanosis, or edema. Respiratory: Normal respiratory effort, no increased work of breathing. GI: Abdomen is soft, nontender, nondistended, no abdominal masses GU: No CVA tenderness. Circumcised phallus. No masses/lesions on penis, testis, scrotum. Prostate 40g smooth no nodules no induration.  Lymph: No cervical or inguinal lymphadenopathy. Skin: No rashes, bruises or suspicious lesions. Neurologic: Grossly intact, no focal deficits, moving all 4 extremities. Psychiatric: Normal mood and  affect.  Laboratory Data: Lab Results  Component Value Date   WBC 7.7 12/20/2015   HGB 14.6 12/20/2015   HCT 44.0 12/20/2015   MCV 86.6 12/20/2015   PLT 298 12/20/2015    Lab Results  Component Value Date   CREATININE 1.10 12/20/2015    Lab Results  Component Value Date   PSA 8.66 (H) 08/06/2015    No results found for: TESTOSTERONE  Lab Results  Component Value Date   HGBA1C 6.2 (H) 09/11/2013    Urinalysis    Component Value Date/Time   COLORURINE AMBER (A) 12/20/2015 1332   APPEARANCEUR HAZY (A) 12/20/2015 1332   LABSPEC 1.021 12/20/2015 1332   PHURINE 5.0 12/20/2015 1332   GLUCOSEU NEGATIVE 12/20/2015 1332   HGBUR NEGATIVE 12/20/2015 1332   BILIRUBINUR  neg 05/12/2019 1448   KETONESUR 20 (A) 12/20/2015 1332   PROTEINUR Negative 05/12/2019 1448   PROTEINUR NEGATIVE 12/20/2015 1332   UROBILINOGEN 0.2 05/12/2019 1448   UROBILINOGEN 4.0 (H) 11/17/2011 2148   NITRITE neg 05/12/2019 1448   NITRITE NEGATIVE 12/20/2015 1332   LEUKOCYTESUR Negative 05/12/2019 1448    Lab Results  Component Value Date   BACTERIA NONE SEEN 12/20/2015    Pertinent Imaging:  No results found for this or any previous visit. No results found for this or any previous visit. No results found for this or any previous visit. No results found for this or any previous visit. Results for orders placed during the hospital encounter of 08/03/15  US Renal   Narrative CLINICAL DATA:  Positive UTI with urinary retention EXAM: RENAL / URINARY TRACT ULTRASOUND COMPLETE COMPARISON:  None. FINDINGS: Right Kidney: Length: 11.7 cm. Echogenicity within normal limits. No mass or hydronephrosis visualized. Left Kidney: Length: 13.1 cm. Echogenicity within normal limits. No mass or hydronephrosis visualized. Bladder: Predominately decompressed IMPRESSION: No acute abnormality is noted in the kidneys. Electronically Signed   By: Inez Catalina M.D.   On: 08/03/2015 17:40   No results found  for this or any previous visit. No results found for this or any previous visit. No results found for this or any previous visit.  Assessment & Plan:    1. Scrotal pain -scrotal US, will call with results   No follow-ups on file.  Nicolette Bang, MD  [facility] [place]

## 2019-06-11 NOTE — Patient Instructions (Signed)
Epididymitis  Epididymitis is swelling (inflammation) or infection of the epididymis. The epididymis is a cord-like structure that is located along the top and back part of the testicle. It collects and stores sperm from the testicle. This condition can also cause pain and swelling of the testicle and scrotum. Symptoms usually start suddenly (acute epididymitis). Sometimes epididymitis starts gradually and lasts for a while (chronic epididymitis). This type may be harder to treat. What are the causes? In men ages 20-40, this condition is usually caused by a bacterial infection or a sexually transmitted disease (STD), such as:  Gonorrhea.  Chlamydia. In men 40 and older who do not have anal sex, this condition is usually caused by bacteria from a blockage or from abnormalities in the urinary system. These can result from:  Having a tube placed into the bladder (urinary catheter).  Having an enlarged or inflamed prostate gland.  Having recently had urinary tract surgery.  Having a problem with a backward flow of urine (retrograde). In men who have a condition that weakens the body's defense system (immune system), such as HIV, this condition can be caused by:  Other bacteria, including tuberculosis and syphilis.  Viruses.  Fungi. Sometimes this condition occurs without infection. This may happen because of trauma or repetitive activities such as sports. What increases the risk? You are more likely to develop this condition if you have:  Unprotected sex with more than one partner.  Anal sex.  Recently had surgery.  A urinary catheter.  Urinary problems.  A suppressed immune system. What are the signs or symptoms? This condition usually begins suddenly with chills, fever, and pain behind the scrotum and in the testicle. Other symptoms include:  Swelling of the scrotum, testicle, or both.  Pain when ejaculating or urinating.  Pain in the back or  abdomen.  Nausea.  Itching and discharge from the penis.  A frequent need to pass urine.  Redness, increased warmth, and tenderness of the scrotum. How is this diagnosed? Your health care provider can diagnose this condition based on your symptoms and medical history. Your health care provider will also do a physical exam to ask about your symptoms and check your scrotum and testicle for swelling, pain, and redness. You may also have other tests, including:  Examination of discharge from the penis.  Urine tests for infections, such as STDs.  Ultrasound test for blood flow and inflammation. Your health care provider may test you for other STDs, including HIV. How is this treated? Treatment for this condition depends on the cause. If your condition is caused by a bacterial infection, oral antibiotic medicine may be prescribed. If the bacterial infection has spread to your blood, you may need to receive IV antibiotics. For both bacterial and nonbacterial epididymitis, you may be treated with:  Rest.  Elevation of the scrotum.  Pain medicines.  Anti-inflammatory medicines. Surgery may be needed to treat:  Bacterial epididymitis that causes pus to build up in the scrotum (abscess).  Chronic epididymitis that has not responded to other treatments. Follow these instructions at home: Medicines  Take over-the-counter and prescription medicines only as told by your health care provider.  If you were prescribed an antibiotic medicine, take it as told by your health care provider. Do not stop taking the antibiotic even if your condition improves. Sexual activity  If your epididymitis was caused by an STD, avoid sexual activity until your treatment is complete.  Inform your sexual partner or partners if you test positive for   an STD. They may need to be treated. Do not engage in sexual activity with your partner or partners until their treatment is completed. Managing pain and  swelling   If directed, elevate your scrotum and apply ice. ? Put ice in a plastic bag. ? Place a small towel or pillow between your legs. ? Rest your scrotum on the pillow or towel. ? Place another towel between your skin and the plastic bag. ? Leave the ice on for 20 minutes, 2-3 times a day.  Try taking a sitz bath to help with discomfort. This is a warm water bath that is taken while you are sitting down. The water should only come up to your hips and should cover your buttocks. Do this 3-4 times per day or as told by your health care provider.  Keep your scrotum elevated and supported while resting. Ask your health care provider if you should wear a scrotal support, such as a jockstrap. Wear it as told by your health care provider. General instructions  Return to your normal activities as told by your health care provider. Ask your health care provider what activities are safe for you.  Drink enough fluid to keep your urine pale yellow.  Keep all follow-up visits as told by your health care provider. This is important. Contact a health care provider if:  You have a fever.  Your pain medicine is not helping.  Your pain is getting worse.  Your symptoms do not improve within 3 days. Summary  Epididymitis is swelling (inflammation) or infection of the epididymis. This condition can also cause pain and swelling of the testicle and scrotum.  Treatment for this condition depends on the cause. If your condition is caused by a bacterial infection, oral antibiotic medicine may be prescribed.  Inform your sexual partner or partners if you test positive for an STD. They may need to be treated. Do not engage in sexual activity with your partner or partners until their treatment is completed.  Contact a health care provider if your symptoms do not improve within 3 days. This information is not intended to replace advice given to you by your health care provider. Make sure you discuss any  questions you have with your health care provider. Document Revised: 10/29/2017 Document Reviewed: 10/30/2017 Elsevier Patient Education  2020 [employer]  

## 2019-06-11 NOTE — Progress Notes (Signed)
Urological Symptom Review  Patient is experiencing the following symptoms: Left groin pain   Review of Systems  Gastrointestinal (upper)  : Negative for upper GI symptoms  Gastrointestinal (lower) : Negative for lower GI symptoms  Constitutional : Negative for symptoms  Skin: Negative for skin symptoms  Eyes: Negative for eye symptoms  Ear/Nose/Throat : Negative for Ear/Nose/Throat symptoms  Hematologic/Lymphatic: Negative for Hematologic/Lymphatic symptoms  Cardiovascular : Negative for cardiovascular symptoms  Respiratory : Negative for respiratory symptoms  Endocrine: Negative for endocrine symptoms  Musculoskeletal: Negative for Musculoskeletal symptoms  Neurological: Negative for neurological symptoms  Psychologic: Negative for psychiatric symptoms

## 2019-06-13 ENCOUNTER — Telehealth: Payer: Self-pay | Admitting: Urology

## 2019-06-13 NOTE — Telephone Encounter (Signed)
Pt requests nurse call back regarding a med issue

## 2019-06-13 NOTE — Telephone Encounter (Signed)
Called. No voice mail set up. Pt did not answer.

## 2019-06-16 ENCOUNTER — Ambulatory Visit (HOSPITAL_COMMUNITY)
Admission: RE | Admit: 2019-06-16 | Discharge: 2019-06-16 | Disposition: A | Discharge status: Home / Self Care | Payer: Medicare Other | Source: Ambulatory Visit | Attending: Urology | Admitting: Urology

## 2019-06-16 ENCOUNTER — Other Ambulatory Visit: Payer: Self-pay

## 2019-06-16 DIAGNOSIS — N5082 Scrotal pain: Secondary | ICD-10-CM | POA: Insufficient documentation

## 2019-06-18 NOTE — Telephone Encounter (Signed)
Attempted to call again Lft message on vm.

## 2019-06-20 ENCOUNTER — Telehealth: Payer: Self-pay

## 2019-07-03 NOTE — Telephone Encounter (Signed)
I was going to send in an antibitoic if the US shows epididymitis. It showed am inguinal hernia. He needs to be referred to Dr. Arnoldo Morale

## 2019-07-04 ENCOUNTER — Other Ambulatory Visit: Payer: Self-pay

## 2019-07-04 DIAGNOSIS — K4090 Unilateral inguinal hernia, without obstruction or gangrene, not specified as recurrent: Secondary | ICD-10-CM

## 2019-07-10 ENCOUNTER — Ambulatory Visit: Payer: Medicare Other | Admitting: Urology

## 2019-07-15 ENCOUNTER — Other Ambulatory Visit: Payer: Self-pay

## 2019-07-15 ENCOUNTER — Ambulatory Visit (INDEPENDENT_AMBULATORY_CARE_PROVIDER_SITE_OTHER): Payer: Medicare Other | Admitting: General Surgery

## 2019-07-15 ENCOUNTER — Encounter: Payer: Self-pay | Admitting: General Surgery

## 2019-07-15 VITALS — BP 119/73 | HR 88 | Temp 97.5°F | Resp 18 | Ht 68.0 in | Wt 253.0 lb

## 2019-07-15 DIAGNOSIS — K4090 Unilateral inguinal hernia, without obstruction or gangrene, not specified as recurrent: Secondary | ICD-10-CM

## 2019-07-15 NOTE — Patient Instructions (Signed)

## 2019-07-16 NOTE — Progress Notes (Signed)
[name]; [id number]; 1945/04/30   HPI Patient is a 74 year old white male who was referred to my care by Dr. Alyson Ingles of urology for evaluation treatment of a left inguinal hernia.  Patient has had intermittent left testicular pain for many years.  He has been treated intermittently with antibiotics for epididymitis.  An ultrasound revealed a small fat filled left inguinal hernia.  His pain is made worse with certain movement and straining.  He denies any nausea or vomiting. 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   Diabetes mellitus    fasting cbg 90-100   GERD (gastroesophageal reflux disease)    Hypertension    Stomach ulcer     Past Surgical History:  Procedure Laterality Date   BACK SURGERY     ENUCLEATION  25 YRS AGO    LEFT EYE DUE TO CANCER   EYE SURGERY     RETINA SURG RT EYE   I & D EXTREMITY  11/18/2011   Procedure: IRRIGATION AND DEBRIDEMENT EXTREMITY;  Surgeon: Tobi Bastos, MD;  Location: [facility];  Service: Orthopedics;  Laterality: Left;  I & D Left Thigh (left knee and inner thigh)   KNEE ARTHROSCOPY  08/2010   LUMBAR LAMINECTOMY/DECOMPRESSION MICRODISCECTOMY Left 09/11/2013   Procedure: Left Lumbar Four-Five  Microdiskectomy;  Surgeon: Erline Levine, MD;  Location: [facility];  Service: Neurosurgery;  Laterality: Left;  Left  Lumbar Four-Five  Microdiskectomy   TOTAL KNEE ARTHROPLASTY  04/26/2011   Procedure: TOTAL KNEE ARTHROPLASTY;  Surgeon: Tobi Bastos, MD;  Location: [facility];  Service: Orthopedics;  Laterality: Left;   TUMOR REMOVED     RT LEG  - FATTY TUMOR    History reviewed. No pertinent family history.  Current Outpatient Medications on File Prior to Visit  Medication Sig Dispense Refill   ACCU-CHEK AVIVA PLUS test strip CHECK BLOOD SUGAR 2 TIMES A DAY     Accu-Chek Softclix Lancets lancets as directed 30 days1I     ALPRAZolam (XANAX) 0.5 MG tablet Take 0.5 mg  by mouth every 6 (six) hours as needed.     ALPRAZolam (XANAX) 1 MG tablet Take 1 mg by mouth 2 (two) times daily as needed. Anxiety     brimonidine (ALPHAGAN) 0.2 % ophthalmic solution Place 1 drop into the right eye 2 (two) times daily.     HYDROcodone-acetaminophen (NORCO) 10-325 MG tablet Take 1 tablet by mouth every 6 (six) hours as needed.     JANUVIA 100 MG tablet Take 100 mg by mouth daily.     lactobacillus (FLORANEX/LACTINEX) PACK Take by mouth.     latanoprost (XALATAN) 0.005 % ophthalmic solution SMARTSIG:1 Drop(s) Right Eye Every Evening     meloxicam (MOBIC) 15 MG tablet Take 15 mg by mouth daily.     metFORMIN (GLUCOPHAGE) 500 MG tablet Take 500 mg by mouth 2 (two) times daily with a meal.     omeprazole (PRILOSEC) 20 MG capsule      pioglitazone (ACTOS) 45 MG tablet      ramipril (ALTACE) 10 MG capsule Take 10 mg by mouth daily.     tamsulosin (FLOMAX) 0.4 MG CAPS capsule Take 1 capsule by mouth daily.     Current Facility-Administered Medications on File Prior to Visit  Medication Dose Route Frequency Provider Last Rate Last Admin   lactobacillus (FLORANEX/LACTINEX) granules 1 g  1 g Oral TID  WC Florencia Reasons, MD        Allergies  Allergen Reactions   Latex     blisters   Sulfasalazine Hives   Iohexol      Code: RASH, Onset Date: 62831517    Sulfa Antibiotics    Penicillins Itching    Has patient had a PCN reaction causing immediate rash, facial/tongue/throat swelling, SOB or lightheadedness with hypotension: Yes Has patient had a PCN reaction causing severe rash involving mucus membranes or skin necrosis: No Has patient had a PCN reaction that required hospitalization No Has patient had a PCN reaction occurring within the last 10 years: No If all of the above answers are "NO", then may proceed with Cephalosporin use. PATIENT TOLERATED KEFLEX    Social History   Substance and Sexual Activity  Alcohol Use Yes   Comment: OCCASIONAL    Social  History   Tobacco Use  Smoking Status Never Smoker  Smokeless Tobacco Current User   Types: Chew    Review of Systems  Constitutional: Negative.   HENT: Positive for sinus pain.   Eyes: Positive for blurred vision.  Respiratory: Positive for shortness of breath and wheezing.   Cardiovascular: Negative.   Gastrointestinal: Positive for abdominal pain and heartburn.  Genitourinary: Positive for frequency and urgency.  Musculoskeletal: Positive for back pain, joint pain and neck pain.  Skin: Negative.   Neurological: Positive for dizziness and sensory change.  Endo/Heme/Allergies: Negative.   Psychiatric/Behavioral: Negative.     Objective   Vitals:   07/15/19 1443  BP: 119/73  Pulse: 88  Resp: 18  Temp: (!) 97.5 F (36.4 C)  SpO2: 94%    Physical Exam Vitals reviewed.  Constitutional:      Appearance: Normal appearance. He is obese. He is not ill-appearing.  HENT:     Head: Normocephalic and atraumatic.  Cardiovascular:     Rate and Rhythm: Normal rate and regular rhythm.     Heart sounds: Normal heart sounds. No murmur heard.  No friction rub. No gallop.   Pulmonary:     Effort: Pulmonary effort is normal. No respiratory distress.     Breath sounds: Normal breath sounds. No stridor. No wheezing, rhonchi or rales.  Abdominal:     General: Bowel sounds are normal. There is no distension.     Palpations: Abdomen is soft. There is no mass.     Tenderness: There is no abdominal tenderness. There is no guarding or rebound.     Hernia: A hernia is present.     Comments: Small reducible left inguinal hernia present.  Genitourinary:    Comments: Genitourinary examination is within normal limits. Skin:    General: Skin is warm and dry.  Neurological:     Mental Status: He is alert and oriented to person, place, and time.    Urology notes reviewed. Assessment  Left inguinal hernia, symptomatic Plan   Patient is scheduled for left inguinal herniorrhaphy with mesh  on 07/23/2019.  The risks and benefits of the procedure including bleeding, infection, mesh use, and the possibility of recurrence of the hernia were fully explained to the patient, who gave informed consent.

## 2019-07-16 NOTE — H&P (Signed)
Don MCCLANE; [id number]; [birth year]   HPI Patient is a 74 year old white male who was referred to my care by Dr. Alyson Ingles of urology for evaluation treatment of a left inguinal hernia.  Patient has had intermittent left testicular pain for many years.  He has been treated intermittently with antibiotics for epididymitis.  An ultrasound revealed a small fat filled left inguinal hernia.  His pain is made worse with certain movement and straining.  He denies any nausea or vomiting. Past Medical History:  Diagnosis Date  . Acid reflux   . Arthritis   . Cancer ([facility])    L EYE - MELANOMA  . Diabetes mellitus    fasting cbg 90-100  . GERD (gastroesophageal reflux disease)   . Hypertension   . Stomach ulcer     Past Surgical History:  Procedure Laterality Date  . BACK SURGERY    . ENUCLEATION  25 YRS AGO    LEFT EYE DUE TO CANCER  . EYE SURGERY     RETINA SURG RT EYE  . I & D EXTREMITY  11/18/2011   Procedure: IRRIGATION AND DEBRIDEMENT EXTREMITY;  Surgeon: Tobi Bastos, MD;  Location: [facility];  Service: Orthopedics;  Laterality: Left;  I & D Left Thigh (left knee and inner thigh)  . KNEE ARTHROSCOPY  08/2010  . LUMBAR LAMINECTOMY/DECOMPRESSION MICRODISCECTOMY Left 09/11/2013   Procedure: Left Lumbar Four-Five  Microdiskectomy;  Surgeon: Erline Levine, MD;  Location: [place] [facility];  Service: Neurosurgery;  Laterality: Left;  Left  Lumbar Four-Five  Microdiskectomy  . TOTAL KNEE ARTHROPLASTY  04/26/2011   Procedure: TOTAL KNEE ARTHROPLASTY;  Surgeon: Tobi Bastos, MD;  Location: [facility];  Service: Orthopedics;  Laterality: Left;  . 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 No pertinent family history.  Current Outpatient Medications on File Prior to Visit  Medication Sig Dispense Refill  . ACCU-CHEK AVIVA PLUS test strip CHECK BLOOD SUGAR 2 TIMES A DAY    . Accu-Chek Softclix Lancets lancets as directed 30 days1I    . ALPRAZolam (XANAX) 0.5 MG tablet Take 0.5 mg  by mouth every 6 (six) hours as needed.    . ALPRAZolam (XANAX) 1 MG tablet Take 1 mg by mouth 2 (two) times daily as needed. Anxiety    . brimonidine (ALPHAGAN) 0.2 % ophthalmic solution Place 1 drop into the right eye 2 (two) times daily.    Marland Kitchen HYDROcodone-acetaminophen (NORCO) 10-325 MG tablet Take 1 tablet by mouth every 6 (six) hours as needed.    Marland Kitchen JANUVIA 100 MG tablet Take 100 mg by mouth daily.    Marland Kitchen lactobacillus (FLORANEX/LACTINEX) PACK Take by mouth.    . latanoprost (XALATAN) 0.005 % ophthalmic solution SMARTSIG:1 Drop(s) Right Eye Every Evening    . meloxicam (MOBIC) 15 MG tablet Take 15 mg by mouth daily.    . metFORMIN (GLUCOPHAGE) 500 MG tablet Take 500 mg by mouth 2 (two) times daily with a meal.    . omeprazole (PRILOSEC) 20 MG capsule     . pioglitazone (ACTOS) 45 MG tablet     . ramipril (ALTACE) 10 MG capsule Take 10 mg by mouth daily.    . tamsulosin (FLOMAX) 0.4 MG CAPS capsule Take 1 capsule by mouth daily.     Current Facility-Administered Medications on File Prior to Visit  Medication Dose Route Frequency Provider Last Rate Last Admin  . lactobacillus (FLORANEX/LACTINEX) granules 1 g  1 g Oral TID  WC Florencia Reasons, MD        Allergies  Allergen Reactions  . Latex     blisters  . Sulfasalazine Hives  . Iohexol      Code: RASH, Onset Date: [id number]   . Sulfa Antibiotics   . Penicillins Itching    Has patient had a PCN reaction causing immediate rash, facial/tongue/throat swelling, SOB or lightheadedness with hypotension: Yes Has patient had a PCN reaction causing severe rash involving mucus membranes or skin necrosis: No Has patient had a PCN reaction that required hospitalization No Has patient had a PCN reaction occurring within the last 10 years: No If all of the above answers are "NO", then may proceed with Cephalosporin use. PATIENT TOLERATED KEFLEX    Social History   Substance and Sexual Activity  Alcohol Use Yes   Comment: OCCASIONAL    Social  History   Tobacco Use  Smoking Status Never Smoker  Smokeless Tobacco Current User  . Types: Chew    Review of Systems  Constitutional: Negative.   HENT: Positive for sinus pain.   Eyes: Positive for blurred vision.  Respiratory: Positive for shortness of breath and wheezing.   Cardiovascular: Negative.   Gastrointestinal: Positive for abdominal pain and heartburn.  Genitourinary: Positive for frequency and urgency.  Musculoskeletal: Positive for back pain, joint pain and neck pain.  Skin: Negative.   Neurological: Positive for dizziness and sensory change.  Endo/Heme/Allergies: Negative.   Psychiatric/Behavioral: Negative.     Objective   Vitals:   07/15/19 1443  BP: 119/73  Pulse: 88  Resp: 18  Temp: (!) 97.5 F (36.4 C)  SpO2: 94%    Physical Exam Vitals reviewed.  Constitutional:      Appearance: Normal appearance. He is obese. He is not ill-appearing.  HENT:     Head: Normocephalic and atraumatic.  Cardiovascular:     Rate and Rhythm: Normal rate and regular rhythm.     Heart sounds: Normal heart sounds. No murmur heard.  No friction rub. No gallop.   Pulmonary:     Effort: Pulmonary effort is normal. No respiratory distress.     Breath sounds: Normal breath sounds. No stridor. No wheezing, rhonchi or rales.  Abdominal:     General: Bowel sounds are normal. There is no distension.     Palpations: Abdomen is soft. There is no mass.     Tenderness: There is no abdominal tenderness. There is no guarding or rebound.     Hernia: A hernia is present.     Comments: Small reducible left inguinal hernia present.  Genitourinary:    Comments: Genitourinary examination is within normal limits. Skin:    General: Skin is warm and dry.  Neurological:     Mental Status: He is alert and oriented to person, place, and time.    Urology notes reviewed. Assessment  Left inguinal hernia, symptomatic Plan   Patient is scheduled for left inguinal herniorrhaphy with mesh  on 07/23/2019.  The risks and benefits of the procedure including bleeding, infection, mesh use, and the possibility of recurrence of the hernia were fully explained to the patient, who gave informed consent.

## 2019-07-18 NOTE — Patient Instructions (Signed)
Your procedure is scheduled on: 07/23/2019  Report to [facility] at   6:45  AM.  Call this number if you have problems the morning of surgery: [phone]   Remember:   Do not Eat or Drink after midnight         No Smoking the morning of surgery  :  Take these medicines the morning of surgery with A SIP OF WATER: omeprazole, Flomax and xanax if needed             No diabetic medication am of surgery   Do not wear jewelry, make-up or nail polish.  Do not wear lotions, powders, or perfumes. You may wear deodorant.  Do not shave 48 hours prior to surgery. Men may shave face and neck.  Do not bring valuables to the hospital.  Contacts, dentures or bridgework may not be worn into surgery.  Leave suitcase in the car. After surgery it may be brought to your room.  For patients admitted to the hospital, checkout time is 11:00 AM the day of discharge.   Patients discharged the day of surgery will not be allowed to drive home.    Special Instructions: Shower using CHG night before surgery and shower the day of surgery use CHG.  Use special wash - you have one bottle of CHG for all showers.  You should use approximately 1/2 of the bottle for each shower.  Open Hernia Repair, Adult, Care After These instructions give you information about caring for yourself after your procedure. Your doctor may also give you more specific instructions. If you have problems or questions, contact your doctor. Follow these instructions at home: Surgical cut (incision) care   Follow instructions from your doctor about how to take care of your surgical cut area. Make sure you: ? Wash your hands with soap and water before you change your bandage (dressing). If you cannot use soap and water, use hand sanitizer. ? Change your bandage as told by your doctor. ? Leave stitches (sutures), skin glue, or skin tape (adhesive) strips in place. They may need to stay in place for 2 weeks or longer. If tape strips get loose  and curl up, you may trim the loose edges. Do not remove tape strips completely unless your doctor says it is okay.  Check your surgical cut every day for signs of infection. Check for: ? More redness, swelling, or pain. ? More fluid or blood. ? Warmth. ? Pus or a bad smell. Activity  Do not drive or use heavy machinery while taking prescription pain medicine. Do not drive until your doctor says it is okay.  Until your doctor says it is okay: ? Do not lift anything that is heavier than 10 lb (4.5 kg). ? Do not play contact sports.  Return to your normal activities as told by your doctor. Ask your doctor what activities are safe. General instructions  To prevent or treat having a hard time pooping (constipation) while you are taking prescription pain medicine, your doctor may recommend that you: ? Drink enough fluid to keep your pee (urine) clear or pale yellow. ? Take over-the-counter or prescription medicines. ? Eat foods that are high in fiber, such as fresh fruits and vegetables, whole grains, and beans. ? Limit foods that are high in fat and processed sugars, such as fried and sweet foods.  Take over-the-counter and prescription medicines only as told by your doctor.  Do not take baths, swim, or use a hot tub until  your doctor says it is okay.  Keep all follow-up visits as told by your doctor. This is important. Contact a doctor if:  You develop a rash.  You have more redness, swelling, or pain around your surgical cut.  You have more fluid or blood coming from your surgical cut.  Your surgical cut feels warm to the touch.  You have pus or a bad smell coming from your surgical cut.  You have a fever or chills.  You have blood in your poop (stool).  You have not pooped in 2-3 days.  Medicine does not help your pain. Get help right away if:  You have chest pain or you are short of breath.  You feel light-headed.  You feel weak and dizzy (feel faint).  You  have very bad pain.  You throw up (vomit) and your pain is worse. This information is not intended to replace advice given to you by your health care provider. Make sure you discuss any questions you have with your health care provider. Document Revised: 04/19/2018 Document Reviewed: 06/09/2015 Elsevier Patient Education  2020 [place] Anesthesia, Adult, Care After This sheet gives you information about how to care for yourself after your procedure. Your health care provider may also give you more specific instructions. If you have problems or questions, contact your health care provider. What can I expect after the procedure? After the procedure, the following side effects are common:  Pain or discomfort at the IV site.  Nausea.  Vomiting.  Sore throat.  Trouble concentrating.  Feeling cold or chills.  Weak or tired.  Sleepiness and fatigue.  Soreness and body aches. These side effects can affect parts of the body that were not involved in surgery. Follow these instructions at home:  For at least 24 hours after the procedure:  Have a responsible adult stay with you. It is important to have someone help care for you until you are awake and alert.  Rest as needed.  Do not: ? Participate in activities in which you could fall or become injured. ? Drive. ? Use heavy machinery. ? Drink alcohol. ? Take sleeping pills or medicines that cause drowsiness. ? Make important decisions or sign legal documents. ? Take care of children on your own. Eating and drinking  Follow any instructions from your health care provider about eating or drinking restrictions.  When you feel hungry, start by eating small amounts of foods that are soft and easy to digest (bland), such as toast. Gradually return to your regular diet.  Drink enough fluid to keep your urine pale yellow.  If you vomit, rehydrate by drinking water, juice, or clear broth. General instructions  If you  have sleep apnea, surgery and certain medicines can increase your risk for breathing problems. Follow instructions from your health care provider about wearing your sleep device: ? Anytime you are sleeping, including during daytime naps. ? While taking prescription pain medicines, sleeping medicines, or medicines that make you drowsy.  Return to your normal activities as told by your health care provider. Ask your health care provider what activities are safe for you.  Take over-the-counter and prescription medicines only as told by your health care provider.  If you smoke, do not smoke without supervision.  Keep all follow-up visits as told by your health care provider. This is important. Contact a health care provider if:  You have nausea or vomiting that does not get better with medicine.  You cannot eat or  drink without vomiting.  You have pain that does not get better with medicine.  You are unable to pass urine.  You develop a skin rash.  You have a fever.  You have redness around your IV site that gets worse. Get help right away if:  You have difficulty breathing.  You have chest pain.  You have blood in your urine or stool, or you vomit blood. Summary  After the procedure, it is common to have a sore throat or nausea. It is also common to feel tired.  Have a responsible adult stay with you for the first 24 hours after general anesthesia. It is important to have someone help care for you until you are awake and alert.  When you feel hungry, start by eating small amounts of foods that are soft and easy to digest (bland), such as toast. Gradually return to your regular diet.  Drink enough fluid to keep your urine pale yellow.  Return to your normal activities as told by your health care provider. Ask your health care provider what activities are safe for you. This information is not intended to replace advice given to you by your health care provider. Make sure you  discuss any questions you have with your health care provider. Document Revised: 12/29/2016 Document Reviewed: 08/11/2016 Elsevier Patient Education  [place].

## 2019-07-22 ENCOUNTER — Other Ambulatory Visit (HOSPITAL_COMMUNITY)
Admission: RE | Admit: 2019-07-22 | Discharge: 2019-07-22 | Disposition: A | Discharge status: Home / Self Care | Payer: Medicare Other | Source: Ambulatory Visit | Attending: General Surgery | Admitting: General Surgery

## 2019-07-22 ENCOUNTER — Other Ambulatory Visit: Payer: Self-pay

## 2019-07-22 ENCOUNTER — Encounter (HOSPITAL_COMMUNITY)
Admission: RE | Admit: 2019-07-22 | Discharge: 2019-07-22 | Disposition: A | Discharge status: Home / Self Care | Payer: Medicare Other | Source: Ambulatory Visit | Attending: General Surgery | Admitting: General Surgery

## 2019-07-22 ENCOUNTER — Encounter (HOSPITAL_COMMUNITY): Payer: Self-pay

## 2019-07-22 DIAGNOSIS — Z01818 Encounter for other preprocedural examination: Secondary | ICD-10-CM | POA: Insufficient documentation

## 2019-07-22 DIAGNOSIS — Z20822 Contact with and (suspected) exposure to covid-19: Secondary | ICD-10-CM | POA: Insufficient documentation

## 2019-07-22 DIAGNOSIS — I10 Essential (primary) hypertension: Secondary | ICD-10-CM | POA: Insufficient documentation

## 2019-07-22 LAB — BASIC METABOLIC PANEL
Anion gap: 9 (ref 5–15)
BUN: 20 mg/dL (ref 8–23)
CO2: 25 mmol/L (ref 22–32)
Calcium: 9.4 mg/dL (ref 8.9–10.3)
Chloride: 106 mmol/L (ref 98–111)
Creatinine, Ser: 1.11 mg/dL (ref 0.61–1.24)
GFR calc Af Amer: >60 mL/min (ref >60)
GFR calc non Af Amer: >60 mL/min (ref >60)
Glucose, Bld: 119 mg/dL — ABNORMAL HIGH (ref 70–99)
Potassium: 3.8 mmol/L (ref 3.5–5.1)
Sodium: 140 mmol/L (ref 135–145)

## 2019-07-22 LAB — SARS CORONAVIRUS 2 (TAT 6-24 HRS): SARS Coronavirus 2: NEGATIVE

## 2019-07-22 LAB — HEMOGLOBIN A1C
Hgb A1c MFr Bld: 6 % — ABNORMAL HIGH (ref 4.8–5.6)
Mean Plasma Glucose: 125.5 mg/dL

## 2019-07-23 ENCOUNTER — Encounter (HOSPITAL_COMMUNITY)
Admission: RE | Disposition: A | Discharge status: Home / Self Care | Payer: Self-pay | Source: Home / Self Care | Attending: General Surgery

## 2019-07-23 ENCOUNTER — Encounter (HOSPITAL_COMMUNITY): Payer: Self-pay | Admitting: General Surgery

## 2019-07-23 ENCOUNTER — Ambulatory Visit (HOSPITAL_COMMUNITY): Payer: Medicare Other | Admitting: Certified Registered"

## 2019-07-23 ENCOUNTER — Ambulatory Visit (HOSPITAL_COMMUNITY)
Admission: RE | Admit: 2019-07-23 | Discharge: 2019-07-23 | Disposition: A | Discharge status: Home / Self Care | Payer: Medicare Other | Attending: General Surgery | Admitting: General Surgery

## 2019-07-23 DIAGNOSIS — I10 Essential (primary) hypertension: Secondary | ICD-10-CM | POA: Diagnosis not present

## 2019-07-23 DIAGNOSIS — Z91040 Latex allergy status: Secondary | ICD-10-CM | POA: Insufficient documentation

## 2019-07-23 DIAGNOSIS — D649 Anemia, unspecified: Secondary | ICD-10-CM | POA: Diagnosis not present

## 2019-07-23 DIAGNOSIS — Z96652 Presence of left artificial knee joint: Secondary | ICD-10-CM | POA: Insufficient documentation

## 2019-07-23 DIAGNOSIS — K279 Peptic ulcer, site unspecified, unspecified as acute or chronic, without hemorrhage or perforation: Secondary | ICD-10-CM | POA: Insufficient documentation

## 2019-07-23 DIAGNOSIS — E119 Type 2 diabetes mellitus without complications: Secondary | ICD-10-CM | POA: Diagnosis not present

## 2019-07-23 DIAGNOSIS — Z882 Allergy status to sulfonamides status: Secondary | ICD-10-CM | POA: Insufficient documentation

## 2019-07-23 DIAGNOSIS — Z791 Long term (current) use of non-steroidal anti-inflammatories (NSAID): Secondary | ICD-10-CM | POA: Insufficient documentation

## 2019-07-23 DIAGNOSIS — F17220 Nicotine dependence, chewing tobacco, uncomplicated: Secondary | ICD-10-CM | POA: Insufficient documentation

## 2019-07-23 DIAGNOSIS — F419 Anxiety disorder, unspecified: Secondary | ICD-10-CM | POA: Diagnosis not present

## 2019-07-23 DIAGNOSIS — Z881 Allergy status to other antibiotic agents status: Secondary | ICD-10-CM | POA: Insufficient documentation

## 2019-07-23 DIAGNOSIS — Z7984 Long term (current) use of oral hypoglycemic drugs: Secondary | ICD-10-CM | POA: Diagnosis not present

## 2019-07-23 DIAGNOSIS — Z79899 Other long term (current) drug therapy: Secondary | ICD-10-CM | POA: Diagnosis not present

## 2019-07-23 DIAGNOSIS — Z880 Allergy status to penicillin: Secondary | ICD-10-CM | POA: Diagnosis not present

## 2019-07-23 DIAGNOSIS — K219 Gastro-esophageal reflux disease without esophagitis: Secondary | ICD-10-CM | POA: Diagnosis not present

## 2019-07-23 DIAGNOSIS — Z9001 Acquired absence of eye: Secondary | ICD-10-CM | POA: Diagnosis not present

## 2019-07-23 DIAGNOSIS — M1990 Unspecified osteoarthritis, unspecified site: Secondary | ICD-10-CM | POA: Diagnosis not present

## 2019-07-23 DIAGNOSIS — Z85840 Personal history of malignant neoplasm of eye: Secondary | ICD-10-CM | POA: Insufficient documentation

## 2019-07-23 DIAGNOSIS — Z888 Allergy status to other drugs, medicaments and biological substances status: Secondary | ICD-10-CM | POA: Insufficient documentation

## 2019-07-23 DIAGNOSIS — K4090 Unilateral inguinal hernia, without obstruction or gangrene, not specified as recurrent: Secondary | ICD-10-CM | POA: Diagnosis not present

## 2019-07-23 HISTORY — PX: INGUINAL HERNIA REPAIR: SHX194

## 2019-07-23 LAB — GLUCOSE, CAPILLARY
Glucose-Capillary: 106 mg/dL — ABNORMAL HIGH (ref 70–99)
Glucose-Capillary: 114 mg/dL — ABNORMAL HIGH (ref 70–99)

## 2019-07-23 SURGERY — REPAIR, HERNIA, INGUINAL, ADULT
Anesthesia: General | Laterality: Left

## 2019-07-23 MED ORDER — VANCOMYCIN HCL 1000 MG IV SOLR
INTRAVENOUS | Status: DC | PRN
  Administered 2019-07-23: 1000 mg via INTRAVENOUS

## 2019-07-23 MED ORDER — FENTANYL CITRATE (PF) 100 MCG/2ML IJ SOLN
INTRAMUSCULAR | Status: DC | PRN
  Administered 2019-07-23 (×2): 50 ug via INTRAVENOUS

## 2019-07-23 MED ORDER — ONDANSETRON HCL 4 MG/2ML IJ SOLN
INTRAMUSCULAR | Status: DC | PRN
  Administered 2019-07-23: 4 mg via INTRAVENOUS

## 2019-07-23 MED ORDER — HYDROMORPHONE HCL 1 MG/ML IJ SOLN
INTRAMUSCULAR | Status: AC
  Filled 2019-07-23: qty 0.5

## 2019-07-23 MED ORDER — VANCOMYCIN HCL IN DEXTROSE 1-5 GM/200ML-% IV SOLN: 1000.0000 mg | INTRAVENOUS | Status: DC

## 2019-07-23 MED ORDER — SUCCINYLCHOLINE CHLORIDE 20 MG/ML IJ SOLN
INTRAMUSCULAR | Status: DC | PRN
  Administered 2019-07-23: 180 mg via INTRAVENOUS

## 2019-07-23 MED ORDER — ROCURONIUM BROMIDE 100 MG/10ML IV SOLN
INTRAVENOUS | Status: DC | PRN
  Administered 2019-07-23: 40 mg via INTRAVENOUS
  Administered 2019-07-23: 10 mg via INTRAVENOUS

## 2019-07-23 MED ORDER — ONDANSETRON HCL 4 MG/2ML IJ SOLN
INTRAMUSCULAR | Status: AC
  Filled 2019-07-23: qty 2

## 2019-07-23 MED ORDER — DEXAMETHASONE SODIUM PHOSPHATE 10 MG/ML IJ SOLN
INTRAMUSCULAR | Status: AC
  Filled 2019-07-23: qty 1

## 2019-07-23 MED ORDER — FENTANYL CITRATE (PF) 100 MCG/2ML IJ SOLN
INTRAMUSCULAR | Status: AC
  Filled 2019-07-23: qty 2

## 2019-07-23 MED ORDER — ORAL CARE MOUTH RINSE: 15.0000 mL | Freq: Once | OROMUCOSAL | Status: AC

## 2019-07-23 MED ORDER — LACTATED RINGERS IV SOLN: INTRAVENOUS | Status: DC

## 2019-07-23 MED ORDER — ONDANSETRON HCL 4 MG/2ML IJ SOLN: 4.0000 mg | Freq: Once | INTRAMUSCULAR | Status: DC | PRN

## 2019-07-23 MED ORDER — PROPOFOL 10 MG/ML IV BOLUS
INTRAVENOUS | Status: DC | PRN
  Administered 2019-07-23: 150 mg via INTRAVENOUS

## 2019-07-23 MED ORDER — PROPOFOL 10 MG/ML IV BOLUS
INTRAVENOUS | Status: AC
  Filled 2019-07-23: qty 20

## 2019-07-23 MED ORDER — SUCCINYLCHOLINE CHLORIDE 200 MG/10ML IV SOSY
PREFILLED_SYRINGE | INTRAVENOUS | Status: AC
  Filled 2019-07-23: qty 10

## 2019-07-23 MED ORDER — VANCOMYCIN HCL IN DEXTROSE 1-5 GM/200ML-% IV SOLN
INTRAVENOUS | Status: AC
  Filled 2019-07-23: qty 200

## 2019-07-23 MED ORDER — CHLORHEXIDINE GLUCONATE CLOTH 2 % EX PADS: 6.0000 | MEDICATED_PAD | Freq: Once | CUTANEOUS | Status: DC

## 2019-07-23 MED ORDER — DEXAMETHASONE SODIUM PHOSPHATE 10 MG/ML IJ SOLN
INTRAMUSCULAR | Status: DC | PRN
  Administered 2019-07-23: 4 mg via INTRAVENOUS

## 2019-07-23 MED ORDER — ROCURONIUM BROMIDE 10 MG/ML (PF) SYRINGE
PREFILLED_SYRINGE | INTRAVENOUS | Status: AC
  Filled 2019-07-23: qty 10

## 2019-07-23 MED ORDER — SODIUM CHLORIDE 0.9 % IR SOLN
Status: DC | PRN
  Administered 2019-07-23: 1000 mL

## 2019-07-23 MED ORDER — CHLORHEXIDINE GLUCONATE 0.12 % MT SOLN
15.0000 mL | Freq: Once | OROMUCOSAL | Status: AC
  Administered 2019-07-23: 15 mL via OROMUCOSAL

## 2019-07-23 MED ORDER — BUPIVACAINE LIPOSOME 1.3 % IJ SUSP
INTRAMUSCULAR | Status: DC | PRN
  Administered 2019-07-23: 20 mL

## 2019-07-23 MED ORDER — LIDOCAINE 2% (20 MG/ML) 5 ML SYRINGE
INTRAMUSCULAR | Status: AC
  Filled 2019-07-23: qty 5

## 2019-07-23 MED ORDER — SUGAMMADEX SODIUM 500 MG/5ML IV SOLN
INTRAVENOUS | Status: DC | PRN
  Administered 2019-07-23: 200 mg via INTRAVENOUS

## 2019-07-23 MED ORDER — HYDROMORPHONE HCL 1 MG/ML IJ SOLN
0.2500 mg | INTRAMUSCULAR | Status: DC | PRN
  Administered 2019-07-23 (×2): 0.5 mg via INTRAVENOUS

## 2019-07-23 MED ORDER — BUPIVACAINE LIPOSOME 1.3 % IJ SUSP
INTRAMUSCULAR | Status: AC
  Filled 2019-07-23: qty 10

## 2019-07-23 MED ORDER — LIDOCAINE HCL (CARDIAC) PF 50 MG/5ML IV SOSY
PREFILLED_SYRINGE | INTRAVENOUS | Status: DC | PRN
  Administered 2019-07-23: 100 mg via INTRAVENOUS

## 2019-07-23 SURGICAL SUPPLY — 41 items
CATH COUDE FOLEY 5CC 14FR (CATHETERS) ×2 IMPLANT
CLOTH BEACON ORANGE TIMEOUT ST (SAFETY) ×2 IMPLANT
COVER LIGHT HANDLE STERIS (MISCELLANEOUS) ×4 IMPLANT
COVER WAND RF STERILE (DRAPES) ×2 IMPLANT
DERMABOND ADVANCED (GAUZE/BANDAGES/DRESSINGS) ×1
DERMABOND ADVANCED .7 DNX12 (GAUZE/BANDAGES/DRESSINGS) ×1 IMPLANT
DRSG XEROFORM 1X8 (GAUZE/BANDAGES/DRESSINGS) ×2 IMPLANT
ELECT REM PT RETURN 9FT ADLT (ELECTROSURGICAL) ×2
ELECTRODE REM PT RTRN 9FT ADLT (ELECTROSURGICAL) ×1 IMPLANT
GAUZE SPONGE 4X4 12PLY STRL (GAUZE/BANDAGES/DRESSINGS) ×2 IMPLANT
GLOVE BIOGEL PI IND STRL 7.0 (GLOVE) ×3 IMPLANT
GLOVE BIOGEL PI IND STRL 7.5 (GLOVE) ×3 IMPLANT
GLOVE BIOGEL PI INDICATOR 7.0 (GLOVE) ×3
GLOVE BIOGEL PI INDICATOR 7.5 (GLOVE) ×3
GLOVE SURG SS PI 7.5 STRL IVOR (GLOVE) ×2 IMPLANT
GOWN STRL REUS W/TWL LRG LVL3 (GOWN DISPOSABLE) ×6 IMPLANT
INST SET MINOR GENERAL (KITS) ×2 IMPLANT
KIT TURNOVER KIT A (KITS) ×2 IMPLANT
MANIFOLD NEPTUNE II (INSTRUMENTS) ×2 IMPLANT
MESH HERNIA 1.6X1.9 PLUG LRG (Mesh General) ×1 IMPLANT
MESH HERNIA PLUG LRG (Mesh General) ×1 IMPLANT
MESH MARLEX PLUG MEDIUM (Mesh General) ×2 IMPLANT
NEEDLE HYPO 18GX1.5 BLUNT FILL (NEEDLE) ×2 IMPLANT
NEEDLE HYPO 22GX1.5 SAFETY (NEEDLE) ×2 IMPLANT
NS IRRIG 1000ML POUR BTL (IV SOLUTION) ×2 IMPLANT
PACK MINOR (CUSTOM PROCEDURE TRAY) ×2 IMPLANT
PAD ARMBOARD 7.5X6 YLW CONV (MISCELLANEOUS) ×2 IMPLANT
PENCIL SMOKE EVACUATOR (MISCELLANEOUS) ×2 IMPLANT
SET BASIN LINEN APH (SET/KITS/TRAYS/PACK) ×2 IMPLANT
SOL PREP PROV IODINE SCRUB 4OZ (MISCELLANEOUS) ×2 IMPLANT
SUT MNCRL AB 4-0 PS2 18 (SUTURE) ×2 IMPLANT
SUT NOVA NAB GS-22 2 2-0 T-19 (SUTURE) ×4 IMPLANT
SUT SILK 3 0 (SUTURE)
SUT SILK 3-0 18XBRD TIE 12 (SUTURE) IMPLANT
SUT VIC AB 2-0 CT1 27 (SUTURE) ×2
SUT VIC AB 2-0 CT1 TAPERPNT 27 (SUTURE) ×1 IMPLANT
SUT VIC AB 3-0 SH 27 (SUTURE) ×4
SUT VIC AB 3-0 SH 27X BRD (SUTURE) ×2 IMPLANT
SUT VICRYL AB 2 0 TIES (SUTURE) IMPLANT
SUT VICRYL AB 3 0 TIES (SUTURE) IMPLANT
SYR 20ML LL LF (SYRINGE) ×4 IMPLANT

## 2019-07-23 NOTE — Anesthesia Preprocedure Evaluation (Addendum)
Anesthesia Evaluation  Patient identified by MRN, date of birth, ID band Patient awake    Reviewed: Allergy & Precautions, H&P , NPO status , Patient's Chart, lab work & pertinent test results, reviewed documented beta blocker date and time   Airway Mallampati: III  TM Distance: >3 FB Neck ROM: full  Mouth opening: Limited Mouth Opening  Dental no notable dental hx. (+) Edentulous Upper   Pulmonary neg pulmonary ROS,    Pulmonary exam normal breath sounds clear to auscultation       Cardiovascular Exercise Tolerance: Good hypertension, negative cardio ROS   Rhythm:regular Rate:Normal     Neuro/Psych PSYCHIATRIC DISORDERS Anxiety negative neurological ROS     GI/Hepatic Neg liver ROS, PUD, GERD  Medicated,  Endo/Other  negative endocrine ROSdiabetes  Renal/GU negative Renal ROS  negative genitourinary   Musculoskeletal   Abdominal   Peds  Hematology  (+) Blood dyscrasia, anemia ,   Anesthesia Other Findings   Reproductive/Obstetrics negative OB ROS                             Anesthesia Physical Anesthesia Plan  ASA: III  Anesthesia Plan: General   Post-op Pain Management:    Induction:   PONV Risk Score and Plan: 2 and Ondansetron  Airway Management Planned:   Additional Equipment:   Intra-op Plan:   Post-operative Plan:   Informed Consent: I have reviewed the patients History and Physical, chart, labs and discussed the procedure including the risks, benefits and alternatives for the proposed anesthesia with the patient or authorized representative who has indicated his/her understanding and acceptance.     Dental Advisory Given  Plan Discussed with: CRNA  Anesthesia Plan Comments:        Anesthesia Quick Evaluation

## 2019-07-23 NOTE — Anesthesia Postprocedure Evaluation (Signed)
Anesthesia Post Note  Patient: [name]  Procedure(s) Performed: HERNIA REPAIR INGUINAL ADULT (Left )  Patient location during evaluation: PACU Anesthesia Type: General Level of consciousness: awake, oriented, awake and alert and patient cooperative Pain management: pain level controlled Vital Signs Assessment: post-procedure vital signs reviewed and stable Respiratory status: spontaneous breathing, respiratory function stable, nonlabored ventilation and patient connected to nasal cannula oxygen Cardiovascular status: blood pressure returned to baseline and stable Postop Assessment: no headache and no backache Anesthetic complications: no   No complications documented.   Last Vitals:  Vitals:   07/23/19 0708 07/23/19 0847  BP: 130/76 (!) (P) 146/73  Pulse: 65 (P) 67  Resp: 16 (P) 12  Temp: 36.7 C (!) (P) 36.4 C  SpO2: 96% (P) 99%    Last Pain:  Vitals:   07/23/19 0708  TempSrc: Oral  PainSc: 6                  Tacy Learn

## 2019-07-23 NOTE — Interval H&P Note (Signed)
History and Physical Interval Note:  07/23/2019 7:20 AM  [name]  has presented today for surgery, with the diagnosis of Left inguinal hernia.  The various methods of treatment have been discussed with the patient and family. After consideration of risks, benefits and other options for treatment, the patient has consented to  Procedure(s): HERNIA REPAIR INGUINAL ADULT (Left) as a surgical intervention.  The patient's history has been reviewed, patient examined, no change in status, stable for surgery.  I have reviewed the patient's chart and labs.  Questions were answered to the patient's satisfaction.     Aviva Signs

## 2019-07-23 NOTE — Transfer of Care (Signed)
Immediate Anesthesia Transfer of Care Note  Patient: [name]  Procedure(s) Performed: HERNIA REPAIR INGUINAL ADULT (Left )  Patient Location: PACU  Anesthesia Type:General  Level of Consciousness: awake, alert , oriented and patient cooperative  Airway & Oxygen Therapy: Patient Spontanous Breathing and Patient connected to nasal cannula oxygen  Post-op Assessment: Report given to RN, Post -op Vital signs reviewed and stable and Patient moving all extremities  Post vital signs: Reviewed and stable  Last Vitals:  Vitals Value Taken Time  BP 146/73 07/23/19 0847  Temp    Pulse 63 07/23/19 0850  Resp 21 07/23/19 0850  SpO2 98 % 07/23/19 0850  Vitals shown include unvalidated device data.  Last Pain:  Vitals:   07/23/19 0708  TempSrc: Oral  PainSc: 6       Patients Stated Pain Goal: 6 (93/11/21 6244)  Complications: No complications documented.

## 2019-07-23 NOTE — Op Note (Signed)
Patient:  [name]  DOB:  1945-10-15  MRN:  [id number]   Preop Diagnosis: Left inguinal hernia  Postop Diagnosis: Same  Procedure: Left inguinal herniorrhaphy with mesh  Surgeon: Aviva Signs, MD  Anes: General endotracheal  Indications: Patient is a 74 year old white male who presents with a symptomatic left inguinal hernia.  The risks and benefits of the procedure including bleeding, infection, mesh use, the possibility of recurrence of the hernia were fully explained to the patient, who gave informed consent.  Procedure note: The patient was placed in the supine position.  After induction of general endotracheal anesthesia, the left groin region was prepped and draped using usual sterile technique with Betadine.  Surgical site confirmation was performed.  An incision was made in the left groin region down to the external oblique aponeurosis.  The aponeurosis was incised to the external ring.  A latex free Foley was placed around the spermatic cord.  The vase deferens was noted within the spermatic cord.  The ilioinguinal nerve was identified.  The patient was noted to have a small indirect hernia.  This was reduced and a medium size Bard PerFix plug was inserted.  The floor of the inguinal canal was not readily identified.  There was splaying of the transversalis fascia.  I elected to place a large Bard PerFix plug along the floor the inguinal canal.  An onlay patch was then secured to the conjoined tendon superiorly and the shelving edge of Poupart's ligament inferiorly using 2-0 Novafil interrupted sutures.  The internal ring was recreated using a 2-0 Novafil interrupted suture.  The external oblique aponeurosis was reapproximated using a 2-0 Vicryl running suture.  The subcutaneous layer was reapproximated using a 3-0 Vicryl interrupted suture.  Exparel was instilled into the surrounding wound.  The skin was closed using a 4-0 Monocryl subcuticular suture.  Dermabond was  applied.  All tape and needle counts were correct at the end of the procedure.  The patient was extubated in the operating room and transferred to PACU in stable condition.  Complications: None  EBL:  Minimal    Specimen:  none

## 2019-07-23 NOTE — Discharge Instructions (Signed)
PATIENT INSTRUCTIONS POST-ANESTHESIA  IMMEDIATELY FOLLOWING SURGERY:  Do not drive or operate machinery for the first twenty four hours after surgery.  Do not make any important decisions for twenty four hours after surgery or while taking narcotic pain medications or sedatives.  If you develop intractable nausea and vomiting or a severe headache please notify your doctor immediately.  FOLLOW-UP:  Please make an appointment with your surgeon as instructed. You do not need to follow up with anesthesia unless specifically instructed to do so.  WOUND CARE INSTRUCTIONS (if applicable):  Keep a dry clean dressing on the anesthesia/puncture wound site if there is drainage.  Once the wound has quit draining you may leave it open to air.  Generally you should leave the bandage intact for twenty four hours unless there is drainage.  If the epidural site drains for more than 36-48 hours please call the anesthesia department.  QUESTIONS?:  Please feel free to call your physician or the hospital operator if you have any questions, and they will be happy to assist you.      Open Hernia Repair, Adult, Care After This sheet gives you information about how to care for yourself after your procedure. Your health care provider may also give you more specific instructions. If you have problems or questions, contact your health care provider. What can I expect after the procedure? After the procedure, it is common to have:  Mild discomfort.  Slight bruising.  Minor swelling.  Pain in the abdomen. Follow these instructions at home: Incision care   Follow instructions from your health care provider about how to take care of your incision area. Make sure you: ? Wash your hands with soap and water before you change your bandage (dressing). If soap and water are not available, use hand sanitizer. ? Change your dressing as told by your health care provider. ? Leave stitches (sutures), skin glue, or adhesive  strips in place. These skin closures may need to stay in place for 2 weeks or longer. If adhesive strip edges start to loosen and curl up, you may trim the loose edges. Do not remove adhesive strips completely unless your health care provider tells you to do that.  Check your incision area every day for signs of infection. Check for: ? More redness, swelling, or pain. ? More fluid or blood. ? Warmth. ? Pus or a bad smell. Activity  Do not drive or use heavy machinery while taking prescription pain medicine. Do not drive until your health care provider approves.  Until your health care provider approves: ? Do not lift anything that is heavier than 10 lb (4.5 kg). ? Do not play contact sports.  Return to your normal activities as told by your health care provider. Ask your health care provider what activities are safe. General instructions  To prevent or treat constipation while you are taking prescription pain medicine, your health care provider may recommend that you: ? Drink enough fluid to keep your urine clear or pale yellow. ? Take over-the-counter or prescription medicines. ? Eat foods that are high in fiber, such as fresh fruits and vegetables, whole grains, and beans. ? Limit foods that are high in fat and processed sugars, such as fried and sweet foods.  Take over-the-counter and prescription medicines only as told by your health care provider.  Do not take tub baths or go swimming until your health care provider approves.  Keep all follow-up visits as told by your health care provider. This is  important. Contact a health care provider if:  You develop a rash.  You have more redness, swelling, or pain around your incision.  You have more fluid or blood coming from your incision.  Your incision feels warm to the touch.  You have pus or a bad smell coming from your incision.  You have a fever or chills.  You have blood in your stool (feces).  You have not had a  bowel movement in 2-3 days.  Your pain is not controlled with medicine. Get help right away if:  You have chest pain or shortness of breath.  You feel light-headed or feel faint.  You have severe pain.  You vomit and your pain is worse. This information is not intended to replace advice given to you by your health care provider. Make sure you discuss any questions you have with your health care provider. Document Revised: 12/08/2016 Document Reviewed: 06/09/2015 Elsevier Patient Education  2020 [employer].

## 2019-07-23 NOTE — Anesthesia Procedure Notes (Signed)
Procedure Name: Intubation Performed by: Doxie Augenstein L, CRNA Pre-anesthesia Checklist: Patient identified, Emergency Drugs available, Suction available, Patient being monitored and Timeout performed Patient Re-evaluated:Patient Re-evaluated prior to induction Oxygen Delivery Method: Circle system utilized Preoxygenation: Pre-oxygenation with 100% oxygen Induction Type: IV induction Laryngoscope Size: Miller and 2 Grade View: Grade I Tube size: 8.0 mm Number of attempts: 1 Airway Equipment and Method: Stylet Placement Confirmation: ETT inserted through vocal cords under direct vision,  positive ETCO2,  CO2 detector and breath sounds checked- equal and bilateral Secured at: 23 cm Tube secured with: Tape Dental Injury: Teeth and Oropharynx as per pre-operative assessment        

## 2019-07-24 ENCOUNTER — Encounter (HOSPITAL_COMMUNITY): Payer: Self-pay | Admitting: General Surgery

## 2019-07-31 ENCOUNTER — Telehealth (INDEPENDENT_AMBULATORY_CARE_PROVIDER_SITE_OTHER): Payer: Self-pay | Admitting: General Surgery

## 2019-07-31 DIAGNOSIS — Z09 Encounter for follow-up examination after completed treatment for conditions other than malignant neoplasm: Secondary | ICD-10-CM

## 2019-07-31 NOTE — Progress Notes (Signed)
Message left on patient's home phone.

## 2019-11-14 ENCOUNTER — Other Ambulatory Visit: Payer: Self-pay

## 2019-11-14 ENCOUNTER — Ambulatory Visit (HOSPITAL_COMMUNITY)
Admission: RE | Admit: 2019-11-14 | Discharge: 2019-11-14 | Disposition: A | Discharge status: Home / Self Care | Payer: Medicare Other | Source: Ambulatory Visit | Attending: Internal Medicine | Admitting: Internal Medicine

## 2019-11-14 ENCOUNTER — Other Ambulatory Visit (HOSPITAL_COMMUNITY): Payer: Self-pay | Admitting: Internal Medicine

## 2019-11-14 DIAGNOSIS — R0781 Pleurodynia: Secondary | ICD-10-CM

## 2020-08-16 ENCOUNTER — Encounter (INDEPENDENT_AMBULATORY_CARE_PROVIDER_SITE_OTHER): Payer: Self-pay | Admitting: *Deleted

## 2020-12-14 ENCOUNTER — Encounter (HOSPITAL_COMMUNITY): Payer: Self-pay | Admitting: *Deleted

## 2020-12-14 ENCOUNTER — Emergency Department (HOSPITAL_COMMUNITY): Payer: Medicare Other

## 2020-12-14 ENCOUNTER — Emergency Department (HOSPITAL_COMMUNITY)
Admission: EM | Admit: 2020-12-14 | Discharge: 2020-12-14 | Disposition: A | Discharge status: Home / Self Care | Payer: Medicare Other | Attending: Emergency Medicine | Admitting: Emergency Medicine

## 2020-12-14 DIAGNOSIS — R200 Anesthesia of skin: Secondary | ICD-10-CM | POA: Diagnosis present

## 2020-12-14 DIAGNOSIS — Z85820 Personal history of malignant melanoma of skin: Secondary | ICD-10-CM | POA: Insufficient documentation

## 2020-12-14 DIAGNOSIS — Z7984 Long term (current) use of oral hypoglycemic drugs: Secondary | ICD-10-CM | POA: Diagnosis not present

## 2020-12-14 DIAGNOSIS — E119 Type 2 diabetes mellitus without complications: Secondary | ICD-10-CM | POA: Insufficient documentation

## 2020-12-14 DIAGNOSIS — R202 Paresthesia of skin: Secondary | ICD-10-CM | POA: Insufficient documentation

## 2020-12-14 DIAGNOSIS — Z91040 Latex allergy status: Secondary | ICD-10-CM | POA: Insufficient documentation

## 2020-12-14 DIAGNOSIS — I10 Essential (primary) hypertension: Secondary | ICD-10-CM | POA: Diagnosis not present

## 2020-12-14 DIAGNOSIS — Z96652 Presence of left artificial knee joint: Secondary | ICD-10-CM | POA: Insufficient documentation

## 2020-12-14 DIAGNOSIS — F17220 Nicotine dependence, chewing tobacco, uncomplicated: Secondary | ICD-10-CM | POA: Diagnosis not present

## 2020-12-14 DIAGNOSIS — Z79899 Other long term (current) drug therapy: Secondary | ICD-10-CM | POA: Insufficient documentation

## 2020-12-14 LAB — CBC
HCT: 43.1 % (ref 39.0–52.0)
Hemoglobin: 13.2 g/dL (ref 13.0–17.0)
MCH: 29.1 pg (ref 26.0–34.0)
MCHC: 30.6 g/dL (ref 30.0–36.0)
MCV: 94.9 fL (ref 80.0–100.0)
Platelets: 165 10*3/uL (ref 150–400)
RBC: 4.54 MIL/uL (ref 4.22–5.81)
RDW: 14.3 % (ref 11.5–15.5)
WBC: 6.2 10*3/uL (ref 4.0–10.5)
nRBC: 0 % (ref 0.0–0.2)

## 2020-12-14 LAB — HEPATIC FUNCTION PANEL
ALT: 28 U/L (ref 0–44)
AST: 30 U/L (ref 15–41)
Albumin: 3.9 g/dL (ref 3.5–5.0)
Alkaline Phosphatase: 72 U/L (ref 38–126)
Bilirubin, Direct: 0.1 mg/dL (ref 0.0–0.2)
Indirect Bilirubin: 0.8 mg/dL (ref 0.3–0.9)
Total Bilirubin: 0.9 mg/dL (ref 0.3–1.2)
Total Protein: 6.6 g/dL (ref 6.5–8.1)

## 2020-12-14 LAB — URINALYSIS, ROUTINE W REFLEX MICROSCOPIC
Bilirubin Urine: NEGATIVE
Glucose, UA: NEGATIVE mg/dL
Hgb urine dipstick: NEGATIVE
Ketones, ur: NEGATIVE mg/dL
Leukocytes,Ua: NEGATIVE
Nitrite: NEGATIVE
Protein, ur: NEGATIVE mg/dL
Specific Gravity, Urine: 1.015 (ref 1.005–1.030)
pH: 6.5 (ref 5.0–8.0)

## 2020-12-14 LAB — BASIC METABOLIC PANEL
Anion gap: 9 (ref 5–15)
BUN: 20 mg/dL (ref 8–23)
CO2: 27 mmol/L (ref 22–32)
Calcium: 9.2 mg/dL (ref 8.9–10.3)
Chloride: 103 mmol/L (ref 98–111)
Creatinine, Ser: 1.28 mg/dL — ABNORMAL HIGH (ref 0.61–1.24)
GFR, Estimated: 58 mL/min — ABNORMAL LOW (ref >60)
Glucose, Bld: 147 mg/dL — ABNORMAL HIGH (ref 70–99)
Potassium: 4.2 mmol/L (ref 3.5–5.1)
Sodium: 139 mmol/L (ref 135–145)

## 2020-12-14 MED ORDER — PREDNISONE 10 MG PO TABS
ORAL_TABLET | ORAL | 0 refills | Status: DC
Start: 2020-12-14 — End: 2023-03-21

## 2020-12-14 MED ORDER — PREDNISONE 50 MG PO TABS
60.0000 mg | ORAL_TABLET | Freq: Once | ORAL | Status: AC
  Administered 2020-12-14: 60 mg via ORAL
  Filled 2020-12-14: qty 1

## 2020-12-14 NOTE — Discharge Instructions (Signed)
We are treating you for a possible nerve condition with prednisone.  Make sure you are eating well, and drink plenty of fluids.  Try to get some regular mild exercise.  Follow-up with your doctor next week for checkup.

## 2020-12-14 NOTE — ED Provider Notes (Signed)
[facility] EMERGENCY DEPARTMENT Provider Note   CSN: [id number] Arrival date & time: 12/14/20  1502     History Chief Complaint  Patient presents with   Weakness    [name] is a 75 y.o. male.  HPI He presents for constellation of symptoms which are caused him to have more difficulty walking.  He had an episode of bilateral lower extremity numbness, that lasted about a day 1 week ago.  He has also had intermittent numbness in both hands.  He has resorted to using a cane because of feeling off balance for a week.  He is taking his usual medicines without relief.  He is here with a friend who encouraged him to come in because of the ongoing symptoms.  He has not had any headache, neck pain, chest pain, shortness of breath or focal weakness.  There are no other known active modifying factors.    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   Diabetes mellitus    fasting cbg 90-100   GERD (gastroesophageal reflux disease)    Hypertension    Stomach ulcer     Patient Active Problem List   Diagnosis Date Noted   Scrotal pain 05/12/2019   Epididymitis 08/03/2015   Type 2 diabetes mellitus ([place]) 08/03/2015   Anxiety 08/03/2015   BPH (benign prostatic hyperplasia) 08/03/2015   Herniated lumbar disc without myelopathy 09/11/2013   Abscess of left thigh 11/18/2011   Acute blood loss anemia 04/28/2011   Osteoarthritis, knee 04/26/2011    Past Surgical History:  Procedure Laterality Date   BACK SURGERY     ENUCLEATION  25 YRS AGO    LEFT EYE DUE TO CANCER   EYE SURGERY     RETINA SURG RT EYE   I & D EXTREMITY  11/18/2011   Procedure: IRRIGATION AND DEBRIDEMENT EXTREMITY;  Surgeon: Tobi Bastos, MD;  Location: [facility] OR[facility];  Service: Orthopedics;  Laterality: Left;  I & D Left Thigh (left knee and inner thigh)   INGUINAL HERNIA REPAIR Left 07/23/2019   Procedure: HERNIA REPAIR INGUINAL ADULT;  Surgeon: Aviva Signs, MD;  Location: [facility];  Service: General;  Laterality: Left;   KNEE ARTHROSCOPY  08/2010   LUMBAR LAMINECTOMY/DECOMPRESSION MICRODISCECTOMY Left 09/11/2013   Procedure: Left Lumbar Four-Five  Microdiskectomy;  Surgeon: Erline Levine, MD;  Location: [facility];  Service: Neurosurgery;  Laterality: Left;  Left  Lumbar Four-Five  Microdiskectomy   TOTAL KNEE ARTHROPLASTY  04/26/2011   Procedure: TOTAL KNEE ARTHROPLASTY;  Surgeon: Tobi Bastos, MD;  Location: [facility];  Service: Orthopedics;  Laterality: Left;   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  Social History   Tobacco Use   Smoking status: Never   Smokeless tobacco: Current    Types: Chew  Substance Use Topics   Alcohol use: Yes    Comment: OCCASIONAL   Drug use: No    Home Medications Prior to Admission medications   Medication Sig Start Date End Date Taking? Authorizing Provider  predniSONE (DELTASONE) 10 MG tablet Take q day 6,5,4,3,2,1 12/14/20  Yes Daleen Bo, MD  ACCU-CHEK AVIVA PLUS test strip CHECK BLOOD SUGAR 2 TIMES A DAY 04/11/19   [provider]  Accu-Chek Softclix Lancets lancets as directed 30 days1I 01/14/19   [provider]  ALPRAZolam (XANAX) 0.5 MG tablet Take 0.5 mg by mouth 3 (three) times daily as needed for anxiety.     [provider]  HYDROcodone-acetaminophen (NORCO) 10-325 MG tablet Take 1 tablet by mouth every 6 (six) hours as needed for moderate pain.  12/08/15   [provider]  JANUVIA 100 MG tablet Take 100 mg by mouth daily. 07/10/15   [provider]  latanoprost (XALATAN) 0.005 % ophthalmic solution Place 1 drop into both eyes every evening.  04/11/19   [provider]  Multiple Vitamin (MULTIVITAMIN WITH MINERALS) TABS tablet Take 1 tablet by mouth daily.    [provider]  Omega-3 Fatty Acids (FISH OIL) 1000 MG CAPS Take 1,000 mg by mouth daily.    [provider]  pioglitazone  (ACTOS) 45 MG tablet Take 45 mg by mouth daily.  04/25/17   [provider]  ramipril (ALTACE) 10 MG capsule Take 10 mg by mouth daily. 02/12/19   [provider]  tamsulosin (FLOMAX) 0.4 MG CAPS capsule Take 0.4 mg by mouth daily.  06/08/15   [provider]    Allergies    Latex, Sulfasalazine, Sulfa antibiotics, Iohexol, and Penicillins  Review of Systems   Review of Systems  All other systems reviewed and are negative.  Physical Exam Updated Vital Signs BP (!) 141/65 (BP Location: Right Arm)   Pulse 74   Temp 97.8 F (36.6 C) (Oral)   Resp 18   Ht (P) 5\' 11"  (1.803 m)   SpO2 97%   BMI (P) 34.16 kg/m   Physical Exam Vitals and nursing note reviewed.  Constitutional:      General: He is not in acute distress.    Appearance: He is well-developed. He is obese. He is not ill-appearing, toxic-appearing or diaphoretic.  HENT:     Head: Normocephalic and atraumatic.     Right Ear: External ear normal.     Left Ear: External ear normal.  Eyes:     Conjunctiva/sclera: Conjunctivae normal.     Pupils: Pupils are equal, round, and reactive to light.  Neck:     Trachea: Phonation normal.  Cardiovascular:     Rate and Rhythm: Normal rate and regular rhythm.     Heart sounds: Normal heart sounds.  Pulmonary:     Effort: Pulmonary effort is normal.     Breath sounds: Normal breath sounds.  Abdominal:     General: There is no distension.     Palpations: Abdomen is soft.     Tenderness: There is no abdominal tenderness.  Musculoskeletal:        General: Normal range of motion.     Cervical back: Normal range of motion and neck supple.  Skin:    General: Skin is warm and dry.  Neurological:     Mental Status: He is alert and oriented to person, place, and time.     Cranial Nerves: No cranial nerve deficit.     Sensory: No sensory deficit.     Motor: No abnormal muscle tone.     Coordination: Coordination normal.     Comments: No dysarthria, aphasia  or ataxia.  Psychiatric:        Mood and Affect: Mood normal.        Behavior: Behavior normal.        Thought Content: Thought content normal.        Judgment: Judgment normal.    ED Results / Procedures / Treatments  Labs (all labs ordered are listed, but only abnormal results are displayed) Labs Reviewed  BASIC METABOLIC PANEL - Abnormal; Notable for the following components:      Result Value   Glucose, Bld 147 (*)    Creatinine, Ser 1.28 (*)    GFR, Estimated 58 (*)    All other components within normal limits  CBC  URINALYSIS, ROUTINE W REFLEX MICROSCOPIC  HEPATIC FUNCTION PANEL    EKG EKG Interpretation  Date/Time:  Tuesday December 14 2020 19:20:48 EST Ventricular Rate:  57 PR Interval:  264 QRS Duration: 108 QT Interval:  464 QTC Calculation: 451 R Axis:   12 Text Interpretation: Sinus bradycardia with 1st degree A-V block Incomplete right bundle branch block Borderline ECG Since last tracing rate slower Otherwise no significant change Confirmed by Daleen Bo [phone]) on 12/14/2020 9:27:53 PM  Radiology DG Chest 2 View  Result Date: 12/14/2020 CLINICAL DATA:  Weakness. EXAM: CHEST - 2 VIEW COMPARISON:  Chest radiograph dated 11/14/2019 FINDINGS: The lungs are clear. There is no pleural effusion pneumothorax. The cardiac silhouette is within limits. No acute osseous pathology. Degenerative changes of the spine. IMPRESSION: No active cardiopulmonary disease. Electronically Signed   By: Anner Crete M.D.   On: 12/14/2020 17:50   CT Head Wo Contrast  Result Date: 12/14/2020 CLINICAL DATA:  Slurred speech, cervical radiculopathy. EXAM: CT HEAD WITHOUT CONTRAST CT CERVICAL SPINE WITHOUT CONTRAST TECHNIQUE: Multidetector CT imaging of the head and cervical spine was performed following the standard protocol without intravenous contrast. Multiplanar CT image reconstructions of the cervical spine were also generated. COMPARISON:  None available currently. FINDINGS: CT  HEAD FINDINGS Brain: No evidence of acute infarction, hemorrhage, hydrocephalus, extra-axial collection or mass lesion/mass effect. Vascular: No hyperdense vessel or unexpected calcification. Skull: Normal. Negative for fracture or focal lesion. Sinuses/Orbits: No acute finding. Other: None. CT CERVICAL SPINE FINDINGS Alignment: Normal. Skull base and vertebrae: No acute fracture. No primary bone lesion or focal pathologic process. Soft tissues and spinal canal: No prevertebral fluid or swelling. No visible canal hematoma. Disc levels: Severe degenerative disc disease is noted at C5-6 and C6-7 and C7-T1. Upper chest: Negative. Other: None. IMPRESSION: No acute intracranial abnormality seen. 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 No acute abnormality is noted. Electronically Signed   By: Marijo Conception M.D.   On: 12/14/2020 17:52   CT Cervical Spine Wo Contrast  Result Date: 12/14/2020 CLINICAL DATA:  Slurred speech, cervical radiculopathy. EXAM: CT HEAD WITHOUT CONTRAST CT CERVICAL SPINE WITHOUT CONTRAST TECHNIQUE: Multidetector CT imaging of the head and cervical spine was performed following the standard protocol without intravenous contrast. Multiplanar CT image reconstructions of the cervical spine were also generated. COMPARISON:  None available currently. FINDINGS: CT HEAD FINDINGS Brain: No evidence of acute infarction, hemorrhage, hydrocephalus, extra-axial collection or mass lesion/mass effect. Vascular: No hyperdense vessel or unexpected calcification. Skull: Normal. Negative for fracture or focal lesion. Sinuses/Orbits: No acute finding. Other: None. CT CERVICAL SPINE FINDINGS Alignment: Normal. Skull base and vertebrae: No acute fracture. No primary bone lesion or focal pathologic process. Soft tissues and spinal canal: No prevertebral fluid or swelling. No visible canal hematoma. Disc levels: Severe degenerative disc disease is noted at C5-6 and C6-7 and C7-T1.  Upper chest: Negative. Other: None. IMPRESSION: No acute intracranial abnormality seen. 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 No acute abnormality is noted. Electronically Signed   By: Marijo Conception M.D.   On:  12/14/2020 17:52    Procedures Procedures   Medications Ordered in ED Medications  predniSONE (DELTASONE) tablet 60 mg (60 mg Oral Given 12/14/20 2047)    ED Course  I have reviewed the triage vital signs and the nursing notes.  Pertinent labs & imaging results that were available during my care of the patient were reviewed by me and considered in my medical decision making (see chart for details).    MDM Rules/Calculators/A&P                            Patient Vitals for the past 24 hrs:  BP Temp Temp src Pulse Resp SpO2 Height  12/14/20 1939 (!) 141/65 97.8 F (36.6 C) Oral 74 18 97 % --  12/14/20 1745 -- -- -- -- -- -- (P) 5\' 11"  (1.803 m)  12/14/20 1600 (!) 146/71 97.8 F (36.6 C) Oral 65 18 94 % --    8:31 PM Reevaluation with update and discussion. After initial assessment and treatment, an updated evaluation reveals he is comfortable, ambulating has no further complaints.  Findings discussed and questions answered. Daleen Bo   Medical Decision Making:  This patient is presenting for evaluation of paresthesias of arms and legs, which does require a range of treatment options, and is a complaint that involves a moderate risk of morbidity and mortality. The differential diagnoses include CNS abnormality, peripheral neuropathy, malaise, metabolic disorder. I decided to review old records, and in summary elderly male presenting with nonspecific symptoms, subacute.  He has a history of osteoarthritis, diabetes and anxiety.  I obtained additional historical information from family friend at bedside.  Clinical Laboratory Tests Ordered, included CBC, Metabolic panel, and Urinalysis. Review indicates normal except glucose high,  creatinine high, GFR low. Radiologic Tests Ordered, included CT head, CT cervical spine, x-ray chest.  I independently Visualized: Radiographic images, which show no acute abnormalities   Critical Interventions-CT imaging, radiography, clinical evaluation, laboratory testing  After These Interventions, the Patient was reevaluated and was found with nonspecific findings, and reassuring evaluations.  Suspect degenerative joint disease leading to cervical radiculopathy causing upper extremity discomfort.  Unclear cause for lower extremity numbness.  Doubt spinal myelopathy.  Prednisone started in ED.  CRITICAL CARE-no Performed by: Daleen Bo  Nursing Notes Reviewed/ Care Coordinated Applicable Imaging Reviewed Interpretation of Laboratory Data incorporated into ED treatment  The patient appears reasonably screened and/or stabilized for discharge and I doubt any other medical condition or other [facility] requiring further screening, evaluation, or treatment in the ED at this time prior to discharge.  Plan: Home Medications-continue usual; Home Treatments-rest, gradual advance activity; return here if the recommended treatment, does not improve the symptoms; Recommended follow up-PCP, PR     Final Clinical Impression(s) / ED Diagnoses Final diagnoses:  Paresthesia    Rx / DC Orders ED Discharge Orders          Ordered    predniSONE (DELTASONE) 10 MG tablet        12/14/20 2032             Daleen Bo, MD 12/14/20 2130

## 2020-12-14 NOTE — ED Triage Notes (Signed)
Extremity weakness for over a week, also had some episodes of slurred speech over the past week

## 2020-12-16 ENCOUNTER — Encounter (INDEPENDENT_AMBULATORY_CARE_PROVIDER_SITE_OTHER): Payer: Self-pay | Admitting: Gastroenterology

## 2020-12-16 ENCOUNTER — Other Ambulatory Visit: Payer: Self-pay

## 2020-12-16 ENCOUNTER — Ambulatory Visit (INDEPENDENT_AMBULATORY_CARE_PROVIDER_SITE_OTHER): Payer: Medicare Other | Admitting: Gastroenterology

## 2020-12-16 DIAGNOSIS — R1110 Vomiting, unspecified: Secondary | ICD-10-CM | POA: Diagnosis not present

## 2020-12-16 DIAGNOSIS — Z860100 Personal history of colon polyps, unspecified: Secondary | ICD-10-CM

## 2020-12-16 DIAGNOSIS — R1030 Lower abdominal pain, unspecified: Secondary | ICD-10-CM

## 2020-12-16 DIAGNOSIS — R109 Unspecified abdominal pain: Secondary | ICD-10-CM | POA: Insufficient documentation

## 2020-12-16 MED ORDER — DICYCLOMINE HCL 10 MG PO CAPS: 10.0000 mg | ORAL_CAPSULE | Freq: Two times a day (BID) | ORAL | 5 refills | Status: DC | PRN

## 2020-12-16 NOTE — Patient Instructions (Addendum)
Schedule EGD and colonoscopy Start Bentyl 1 tablet q12h as needed for abdominal pain Attempt to decrease opiate/Norco intake as much as possible

## 2020-12-16 NOTE — Progress Notes (Signed)
[name], M.D. Gastroenterology & Hepatology [facility] [address] [place], [place] [address] Primary Care Physician: Don Sill, NP [address] [place] [address]  Referring MD: PCP  Chief Complaint:  regurgitation  History of Present Illness: [name] is a 75 y.o. male with Pmh eye melanoma, DM, HTN,  who presents for evaluation of regurgitation and abdominal pain.  The patient underwent an inguinal hernia repair with mesh on 07/23/2019 by Dr. Arnoldo Morale. Patient reports that after he had his hernia repaired, he has felt recurrent episodes of pain throughout his abdomen. The patient reports that the pain is present all time. Sometimes the pain has woken him during his sleep.  He has been taking hydrocodone every 6-8 hours for chronic back pain for at least the last 3 years.  Also reports that for the last 4 months, he has noticed having some episodes of regurgitation of food contents, especially when bending. He states that sometimes he has episodes of food regurgitation up to 3 times a day, but this does not happen every day. States that he has heartburn very seldom. States that sometimes he feels food does not go down when eating. Patient states "that some days" he vomits 1-2 times a day but does not know exactly how often.   Takes Miralax every other day for constipation, usually has a BM 3-4 times a night.  The patient denies having any fever, chills, hematochezia, melena, hematemesis,  diarrhea, jaundice, pruritus or weight loss.  Last EGD: believes he had one performed but no documentation is available, believes he had a dilation Last Colonoscopy:2012 - diverticulosis,   There were a couple of  3-mm diminutive polyps adjacent to the larger polyp. The pedunculated polyp in the rectum was  removed with snare cautery and recovered through the scope. The two adjacent  diminutive polyps were destroyed with  the tip of the snare.   FHx: neg for any gastrointestinal/liver disease, father had some cancer but he does not know which type Social: former smoking quit 40 years ago but chews tobacco 3 times a month, used to drink 1 shot of [place] in AM but states he stopped a couple of weeks ago, neg illicit drug use Surgical: inguinal hernia repair  Past Medical History: Past Medical History:  Diagnosis Date   Acid reflux    Arthritis    Cancer ([place])    L EYE - MELANOMA   Diabetes mellitus    fasting cbg 90-100   GERD (gastroesophageal reflux disease)    Hypertension    Stomach ulcer     Past Surgical History: Past Surgical History:  Procedure Laterality Date   BACK SURGERY     ENUCLEATION  25 YRS AGO    LEFT EYE DUE TO CANCER   EYE SURGERY     RETINA SURG RT EYE   I & D EXTREMITY  11/18/2011   Procedure: IRRIGATION AND DEBRIDEMENT EXTREMITY;  Surgeon: Tobi Bastos, MD;  Location: [facility];  Service: Orthopedics;  Laterality: Left;  I & D Left Thigh (left knee and inner thigh)   INGUINAL HERNIA REPAIR Left 07/23/2019   Procedure: HERNIA REPAIR INGUINAL ADULT;  Surgeon: Aviva Signs, MD;  Location: [facility];  Service: General;  Laterality: Left;   KNEE ARTHROSCOPY  08/2010   LUMBAR LAMINECTOMY/DECOMPRESSION MICRODISCECTOMY Left 09/11/2013   Procedure: Left Lumbar Four-Five  Microdiskectomy;  Surgeon: Erline Levine, MD;  Location: [facility];  Service:  Neurosurgery;  Laterality: Left;  Left  Lumbar Four-Five  Microdiskectomy   TOTAL KNEE ARTHROPLASTY  04/26/2011   Procedure: TOTAL KNEE ARTHROPLASTY;  Surgeon: Tobi Bastos, MD;  Location: [facility];  Service: Orthopedics;  Laterality: Left;   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 No pertinent family history.  Social History: Social History   Tobacco Use  Smoking Status Never  Smokeless Tobacco Current   Types: Chew   Social History   Substance and Sexual Activity  Alcohol Use Yes    Comment: OCCASIONAL   Social History   Substance and Sexual Activity  Drug Use No    Allergies: Allergies  Allergen Reactions   Latex     blisters   Sulfasalazine Hives   Sulfa Antibiotics    Iohexol Rash     Code: RASH, Onset Date: [id number]    Penicillins Itching    Has patient had a PCN reaction causing immediate rash, facial/tongue/throat swelling, SOB or lightheadedness with hypotension: Yes Has patient had a PCN reaction causing severe rash involving mucus membranes or skin necrosis: No Has patient had a PCN reaction that required hospitalization No Has patient had a PCN reaction occurring within the last 10 years: No If all of the above answers are "NO", then may proceed with Cephalosporin use. PATIENT TOLERATED KEFLEX    Medications: Current Outpatient Medications  Medication Sig Dispense Refill   ACCU-CHEK AVIVA PLUS test strip CHECK BLOOD SUGAR 2 TIMES A DAY     Accu-Chek Softclix Lancets lancets as directed 30 days1I     ALPRAZolam (XANAX) 0.5 MG tablet Take 0.5 mg by mouth 3 (three) times daily as needed for anxiety.      HYDROcodone-acetaminophen (NORCO) 10-325 MG tablet Take 1 tablet by mouth every 6 (six) hours as needed for moderate pain.      JANUVIA 100 MG tablet Take 100 mg by mouth daily.     latanoprost (XALATAN) 0.005 % ophthalmic solution Place 1 drop into both eyes every evening.      Multiple Vitamin (MULTIVITAMIN WITH MINERALS) TABS tablet Take 1 tablet by mouth daily.     Omega-3 Fatty Acids (FISH OIL) 1000 MG CAPS Take 1,000 mg by mouth daily.     pioglitazone (ACTOS) 45 MG tablet Take 45 mg by mouth daily.      predniSONE (DELTASONE) 10 MG tablet Take q day 6,5,4,3,2,1 21 tablet 0   ramipril (ALTACE) 10 MG capsule Take 10 mg by mouth daily.     tamsulosin (FLOMAX) 0.4 MG CAPS capsule Take 0.4 mg by mouth daily.      No current facility-administered medications for this visit.    Review of Systems: GENERAL: negative for malaise, night  sweats HEENT: No changes in hearing or vision, no nose bleeds or other nasal problems. NECK: Negative for lumps, goiter, pain and significant neck swelling RESPIRATORY: Negative for cough, wheezing CARDIOVASCULAR: Negative for chest pain, leg swelling, palpitations, orthopnea GI: SEE HPI MUSCULOSKELETAL: Negative for joint pain or swelling, back pain, and muscle pain. SKIN: Negative for lesions, rash PSYCH: Negative for sleep disturbance, mood disorder and recent psychosocial stressors. HEMATOLOGY Negative for prolonged bleeding, bruising easily, and swollen nodes. ENDOCRINE: Negative for cold or heat intolerance, polyuria, polydipsia and goiter. NEURO: negative for tremor, gait imbalance, syncope and seizures. The remainder of the review of systems is noncontributory.   Physical Exam: BP 130/77 (BP Location: Left Arm, Patient Position: Sitting, Cuff Size: Large)  Pulse 83   Temp 98.4 F (36.9 C)   Ht 5\' 7"  (1.702 m)   Wt 251 lb (113.9 kg)   BMI 39.31 kg/m  GENERAL: The patient is AO x3, in no acute distress. Obese  HEENT: Head is normocephalic and atraumatic. EOMI are intact. Mouth is well hydrated and without lesions. NECK: Supple. No masses LUNGS: Clear to auscultation. No presence of rhonchi/wheezing/rales. Adequate chest expansion HEART: RRR, normal s1 and s2. ABDOMEN: mildly tender to palpation in the lower abdomen, no guarding, no peritoneal signs, and nondistended. BS +. No masses. EXTREMITIES: Without any cyanosis, clubbing, rash, lesions or edema. NEUROLOGIC: AOx3, no focal motor deficit. SKIN: no jaundice, no rashes   Imaging/Labs: as above  I personally reviewed and interpreted the available labs, imaging and endoscopic files.  Impression and Plan: KIMBLE HITCHENS is a 75 y.o. male with Pmh eye melanoma, DM, HTN,  who presents for evaluation of regurgitation and abdominal pain.  The patient has presented recurrent episodes of regurgitation of unclear etiology.   It is unclear if she has presented some degree of dysphagia as he is stable food takes longer to go down, although it is possible that he is presenting some degree of gastroparesis leading to his symptoms.  In fact, the exposure to opiates chronically may have led to these symptoms and current presentation.  I explained this to the patient and advised him to taper off the opiates as much as possible.  Nevertheless, we will investigate this further with an EGD to determine if there is any organic pathology leading to his symptoms.  He may undergo empiric dilation at that time to address his dysphagia.  Regarding his abdominal pain, he has been unspecific pain in different areas of his abdomen.  This can be further evaluated with the esophagogastroduodenospy.  His abdominal pain could be related to the chronic opiate exposure but also given the timing of his symptoms it could be related to the mesh he has in his abdomen which can cause chronic recurrent discomfort.  I explained to the patient that attempting to remove this mesh may also lead to worsening pain, so I discouraged him to consider this as an option.  May consider eventually performing a CT of the abdomen and pelvis with IV contrast if his symptoms persist his investigations are negative.  Finally, given his history of colon polyps we will proceed with a repeat colonoscopy as his last one was 10 years ago.  - Schedule EGD and colonoscopy - Start Bentyl 1 tablet q12h as needed for abdominal pain - Attempt to decrease opiate/Norco intake as much as possible  All questions were answered.      Don Peppers, MD Gastroenterology and Hepatology [facility]

## 2020-12-20 ENCOUNTER — Telehealth (INDEPENDENT_AMBULATORY_CARE_PROVIDER_SITE_OTHER): Payer: Self-pay

## 2020-12-20 NOTE — Telephone Encounter (Signed)
[name], CMA  

## 2020-12-21 ENCOUNTER — Encounter (INDEPENDENT_AMBULATORY_CARE_PROVIDER_SITE_OTHER): Payer: Self-pay

## 2020-12-21 ENCOUNTER — Telehealth (INDEPENDENT_AMBULATORY_CARE_PROVIDER_SITE_OTHER): Payer: Self-pay

## 2020-12-21 DIAGNOSIS — Z86010 Personal history of colonic polyps: Secondary | ICD-10-CM

## 2020-12-21 MED ORDER — PEG 3350-KCL-NA BICARB-NACL 420 G PO SOLR: 4000.0000 mL | ORAL | 0 refills | Status: DC

## 2020-12-21 NOTE — Telephone Encounter (Signed)
LeighAnn Kippy Melena, CMA  

## 2020-12-27 ENCOUNTER — Encounter (INDEPENDENT_AMBULATORY_CARE_PROVIDER_SITE_OTHER): Payer: Self-pay

## 2021-01-17 ENCOUNTER — Other Ambulatory Visit (INDEPENDENT_AMBULATORY_CARE_PROVIDER_SITE_OTHER): Payer: Self-pay

## 2021-01-21 ENCOUNTER — Encounter (HOSPITAL_COMMUNITY)
Admission: RE | Admit: 2021-01-21 | Discharge: 2021-01-21 | Disposition: A | Discharge status: Home / Self Care | Payer: Commercial Managed Care - HMO | Source: Ambulatory Visit | Attending: Gastroenterology | Admitting: Gastroenterology

## 2021-01-21 NOTE — Patient Instructions (Signed)
WADDELL ITEN  01/21/2021     @PREFPERIOPPHARMACY @   Your procedure is scheduled on 01/25/2021.  Report to [facility] at 8:45 A.M.  Call this number if you have problems the morning of surgery:  [phone]   Remember:  Please follow the diet and prep instructions given to you by Dr Colman Cater office.     Take these medicines the morning of surgery with A SIP OF WATER : Xanax, Hydrocodone, Flomax and Prednisone    Do not wear jewelry, make-up or nail polish.  Do not wear lotions, powders, or perfumes, or deodorant.  Do not shave 48 hours prior to surgery.  Men may shave face and neck.  Do not bring valuables to the hospital.  [facility] is not responsible for any belongings or valuables.  Contacts, dentures or bridgework may not be worn into surgery.  Leave your suitcase in the car.  After surgery it may be brought to your room.  For patients admitted to the hospital, discharge time will be determined by your treatment team.  Patients discharged the day of surgery will not be allowed to drive home.   Name and phone number of your driver:   family Special instructions:  N/a  Please read over the following fact sheets that you were given. Care and Recovery After Surgery  Upper Endoscopy, Adult Upper endoscopy is a procedure to look inside the upper GI (gastrointestinal) tract. The upper GI tract is made up of: The part of the body that moves food from your mouth to your stomach (esophagus). The stomach. The first part of your small intestine (duodenum). This procedure is also called esophagogastroduodenoscopy (EGD) or gastroscopy. In this procedure, your health care provider passes a thin, flexible tube (endoscope) through your mouth and down your esophagus into your stomach. A small camera is attached to the end of the tube. Images from the camera appear on a monitor in the exam room. During this procedure, your health care provider may also remove a small piece of  tissue to be sent to a lab and examined under a microscope (biopsy). Your health care provider may do an upper endoscopy to diagnose cancers of the upper GI tract. You may also have this procedure to find the cause of other conditions, such as: Stomach pain. Heartburn. Pain or problems when swallowing. Nausea and vomiting. Stomach bleeding. Stomach ulcers. Tell a health care provider about: Any allergies you have. All medicines you are taking, including vitamins, herbs, eye drops, creams, and over-the-counter medicines. Any problems you or family members have had with anesthetic medicines. Any blood disorders you have. Any surgeries you have had. Any medical conditions you have. Whether you are pregnant or may be pregnant. What are the risks? Generally, this is a safe procedure. However, problems may occur, including: Infection. Bleeding. Allergic reactions to medicines. A tear or hole (perforation) in the esophagus, stomach, or duodenum. What happens before the procedure? Staying hydrated Follow instructions from your health care provider about hydration, which may include: Up to 2 hours before the procedure - you may continue to drink clear liquids, such as water, clear fruit juice, black coffee, and plain tea.  Eating and drinking restrictions Follow instructions from your health care provider about eating and drinking, which may include: 8 hours before the procedure - stop eating heavy meals or foods, such as meat, fried foods, or fatty foods. 6 hours before the procedure - stop eating light meals or foods, such as toast or cereal.  6 hours before the procedure - stop drinking milk or drinks that contain milk. 2 hours before the procedure - stop drinking clear liquids. Medicines Ask your health care provider about: Changing or stopping your regular medicines. This is especially important if you are taking diabetes medicines or blood thinners. Taking medicines such as aspirin  and ibuprofen. These medicines can thin your blood. Do not take these medicines unless your health care provider tells you to take them. Taking over-the-counter medicines, vitamins, herbs, and supplements. General instructions Plan to have someone take you home from the hospital or clinic. If you will be going home right after the procedure, plan to have someone with you for 24 hours. Ask your health care provider what steps will be taken to help prevent infection. What happens during the procedure?  An IV will be inserted into one of your veins. You may be given one or more of the following: A medicine to help you relax (sedative). A medicine to numb the throat (local anesthetic). You will lie on your left side on an exam table. Your health care provider will pass the endoscope through your mouth and down your esophagus. Your health care provider will use the scope to check the inside of your esophagus, stomach, and duodenum. Biopsies may be taken. The endoscope will be removed. The procedure may vary among health care providers and hospitals. What happens after the procedure? Your blood pressure, heart rate, breathing rate, and blood oxygen level will be monitored until you leave the hospital or clinic. Do not drive for 24 hours if you were given a sedative during your procedure. When your throat is no longer numb, you may be given some fluids to drink. It is up to you to get the results of your procedure. Ask your health care provider, or the department that is doing the procedure, when your results will be ready. Summary Upper endoscopy is a procedure to look inside the upper GI tract. During the procedure, an IV will be inserted into one of your veins. You may be given a medicine to help you relax. A medicine will be used to numb your throat. The endoscope will be passed through your mouth and down your esophagus. This information is not intended to replace advice given to you by your  health care provider. Make sure you discuss any questions you have with your health care provider. Document Revised: 06/20/2017 Document Reviewed: 05/28/2017 Elsevier Patient Education  2022 [place]. Colonoscopy, Adult A colonoscopy is a procedure to look at the entire large intestine. This procedure is done using a long, thin, flexible tube that has a camera on the end. You may have a colonoscopy: As a part of normal colorectal screening. If you have certain symptoms, such as: A low number of red blood cells in your blood (anemia). Diarrhea that does not go away. Pain in your abdomen. Blood in your stool. A colonoscopy can help screen for and diagnose medical problems, including: Tumors. Extra tissue that grows where mucus forms (polyps). Inflammation. Areas of bleeding. Tell your health care provider about: Any allergies you have. All medicines you are taking, including vitamins, herbs, eye drops, creams, and over-the-counter medicines. Any problems you or family members have had with anesthetic medicines. Any blood disorders you have. Any surgeries you have had. Any medical conditions you have. Any problems you have had with having bowel movements. Whether you are pregnant or may be pregnant. What are the risks? Generally, this is a safe procedure.  However, problems may occur, including: Bleeding. Damage to your intestine. Allergic reactions to medicines given during the procedure. Infection. This is rare. What happens before the procedure? Eating and drinking restrictions Follow instructions from your health care provider about eating or drinking restrictions, which may include: A few days before the procedure: Follow a low-fiber diet. Avoid nuts, seeds, dried fruit, raw fruits, and vegetables. 1-3 days before the procedure: Eat only gelatin dessert or ice pops. Drink only clear liquids, such as water, clear juice, clear broth or bouillon, black coffee or tea, or  clear soft drinks or sports drinks. Avoid liquids that contain red or purple dye. The day of the procedure: Do not eat solid foods. You may continue to drink clear liquids until up to 2 hours before the procedure. Do not eat or drink anything starting 2 hours before the procedure, or within the time period that your health care provider recommends. Bowel prep If you were prescribed a bowel prep to take by mouth (orally) to clean out your colon: Take it as told by your health care provider. Starting the day before your procedure, you will need to drink a large amount of liquid medicine. The liquid will cause you to have many bowel movements of loose stool until your stool becomes almost clear or light green. If your skin or the opening between the buttocks (anus) gets irritated from diarrhea, you may relieve the irritation using: Wipes with medicine in them, such as adult wet wipes with aloe and vitamin E. A product to soothe skin, such as petroleum jelly. If you vomit while drinking the bowel prep: Take a break for up to 60 minutes. Begin the bowel prep again. Call your health care provider if you keep vomiting or you cannot take the bowel prep without vomiting. To clean out your colon, you may also be given: Laxative medicines. These help you have a bowel movement. Instructions for enema use. An enema is liquid medicine injected into your rectum. Medicines Ask your health care provider about: Changing or stopping your regular medicines or supplements. This is especially important if you are taking iron supplements, diabetes medicines, or blood thinners. Taking medicines such as aspirin and ibuprofen. These medicines can thin your blood. Do not take these medicines unless your health care provider tells you to take them. Taking over-the-counter medicines, vitamins, herbs, and supplements. General instructions Ask your health care provider what steps will be taken to help prevent infection.  These may include washing skin with a germ-killing soap. Plan to have someone take you home from the hospital or clinic. What happens during the procedure?  An IV will be inserted into one of your veins. You may be given one or more of the following: A medicine to help you relax (sedative). A medicine to numb the area (local anesthetic). A medicine to make you fall asleep (general anesthetic). This is rarely needed. You will lie on your side with your knees bent. The tube will: Have oil or gel put on it (be lubricated). Be inserted into your anus. Be gently eased through all parts of your large intestine. Air will be sent into your colon to keep it open. This may cause some pressure or cramping. Images will be taken with the camera and will appear on a screen. A small tissue sample may be removed to be looked at under a microscope (biopsy). The tissue may be sent to a lab for testing if any signs of problems are found. If small polyps  are found, they may be removed and checked for cancer cells. When the procedure is finished, the tube will be removed. The procedure may vary among health care providers and hospitals. What happens after the procedure? Your blood pressure, heart rate, breathing rate, and blood oxygen level will be monitored until you leave the hospital or clinic. You may have a small amount of blood in your stool. You may pass gas and have mild cramping or bloating in your abdomen. This is caused by the air that was used to open your colon during the exam. Do not drive for 24 hours after the procedure. It is up to you to get the results of your procedure. Ask your health care provider, or the department that is doing the procedure, when your results will be ready. Summary A colonoscopy is a procedure to look at the entire large intestine. Follow instructions from your health care provider about eating and drinking before the procedure. If you were prescribed an oral bowel  prep to clean out your colon, take it as told by your health care provider. During the colonoscopy, a flexible tube with a camera on its end is inserted into the anus and then passed into the other parts of the large intestine. This information is not intended to replace advice given to you by your health care provider. Make sure you discuss any questions you have with your health care provider. Document Revised: 07/19/2018 Document Reviewed: 07/19/2018 Elsevier Patient Education  [place].

## 2021-01-25 ENCOUNTER — Ambulatory Visit (HOSPITAL_COMMUNITY): Payer: Medicare Other | Admitting: Anesthesiology

## 2021-01-25 ENCOUNTER — Encounter (HOSPITAL_COMMUNITY): Payer: Self-pay | Admitting: Gastroenterology

## 2021-01-25 ENCOUNTER — Encounter (HOSPITAL_COMMUNITY)
Admission: RE | Disposition: A | Discharge status: Home / Self Care | Payer: Self-pay | Source: Home / Self Care | Attending: Gastroenterology

## 2021-01-25 ENCOUNTER — Ambulatory Visit (HOSPITAL_COMMUNITY)
Admission: RE | Admit: 2021-01-25 | Discharge: 2021-01-25 | Disposition: A | Discharge status: Home / Self Care | Payer: Medicare Other | Attending: Gastroenterology | Admitting: Gastroenterology

## 2021-01-25 ENCOUNTER — Other Ambulatory Visit: Payer: Self-pay

## 2021-01-25 DIAGNOSIS — E119 Type 2 diabetes mellitus without complications: Secondary | ICD-10-CM | POA: Diagnosis not present

## 2021-01-25 DIAGNOSIS — K2980 Duodenitis without bleeding: Secondary | ICD-10-CM | POA: Diagnosis not present

## 2021-01-25 DIAGNOSIS — F419 Anxiety disorder, unspecified: Secondary | ICD-10-CM | POA: Insufficient documentation

## 2021-01-25 DIAGNOSIS — K219 Gastro-esophageal reflux disease without esophagitis: Secondary | ICD-10-CM | POA: Insufficient documentation

## 2021-01-25 DIAGNOSIS — R109 Unspecified abdominal pain: Secondary | ICD-10-CM | POA: Diagnosis present

## 2021-01-25 DIAGNOSIS — Z09 Encounter for follow-up examination after completed treatment for conditions other than malignant neoplasm: Secondary | ICD-10-CM | POA: Diagnosis not present

## 2021-01-25 DIAGNOSIS — I10 Essential (primary) hypertension: Secondary | ICD-10-CM | POA: Insufficient documentation

## 2021-01-25 DIAGNOSIS — K621 Rectal polyp: Secondary | ICD-10-CM

## 2021-01-25 DIAGNOSIS — D120 Benign neoplasm of cecum: Secondary | ICD-10-CM | POA: Diagnosis not present

## 2021-01-25 DIAGNOSIS — D123 Benign neoplasm of transverse colon: Secondary | ICD-10-CM | POA: Diagnosis not present

## 2021-01-25 DIAGNOSIS — K5730 Diverticulosis of large intestine without perforation or abscess without bleeding: Secondary | ICD-10-CM | POA: Insufficient documentation

## 2021-01-25 DIAGNOSIS — M1990 Unspecified osteoarthritis, unspecified site: Secondary | ICD-10-CM | POA: Insufficient documentation

## 2021-01-25 DIAGNOSIS — D122 Benign neoplasm of ascending colon: Secondary | ICD-10-CM | POA: Insufficient documentation

## 2021-01-25 DIAGNOSIS — K635 Polyp of colon: Secondary | ICD-10-CM | POA: Diagnosis not present

## 2021-01-25 DIAGNOSIS — Z86010 Personal history of colonic polyps: Secondary | ICD-10-CM | POA: Insufficient documentation

## 2021-01-25 DIAGNOSIS — D127 Benign neoplasm of rectosigmoid junction: Secondary | ICD-10-CM | POA: Insufficient documentation

## 2021-01-25 DIAGNOSIS — Z7984 Long term (current) use of oral hypoglycemic drugs: Secondary | ICD-10-CM | POA: Diagnosis not present

## 2021-01-25 DIAGNOSIS — Z1211 Encounter for screening for malignant neoplasm of colon: Secondary | ICD-10-CM | POA: Diagnosis present

## 2021-01-25 DIAGNOSIS — K319 Disease of stomach and duodenum, unspecified: Secondary | ICD-10-CM | POA: Diagnosis not present

## 2021-01-25 DIAGNOSIS — K259 Gastric ulcer, unspecified as acute or chronic, without hemorrhage or perforation: Secondary | ICD-10-CM | POA: Insufficient documentation

## 2021-01-25 DIAGNOSIS — Z79899 Other long term (current) drug therapy: Secondary | ICD-10-CM | POA: Insufficient documentation

## 2021-01-25 HISTORY — PX: BIOPSY: SHX5522

## 2021-01-25 HISTORY — PX: ESOPHAGOGASTRODUODENOSCOPY (EGD) WITH PROPOFOL: SHX5813

## 2021-01-25 HISTORY — PX: COLONOSCOPY WITH PROPOFOL: SHX5780

## 2021-01-25 LAB — HM COLONOSCOPY

## 2021-01-25 LAB — GLUCOSE, CAPILLARY: Glucose-Capillary: 132 mg/dL — ABNORMAL HIGH (ref 70–99)

## 2021-01-25 SURGERY — COLONOSCOPY WITH PROPOFOL
Anesthesia: General

## 2021-01-25 MED ORDER — LIDOCAINE HCL (PF) 2 % IJ SOLN
INTRAMUSCULAR | Status: AC
  Filled 2021-01-25: qty 5

## 2021-01-25 MED ORDER — SUCCINYLCHOLINE CHLORIDE 200 MG/10ML IV SOSY
PREFILLED_SYRINGE | INTRAVENOUS | Status: AC
  Filled 2021-01-25: qty 10

## 2021-01-25 MED ORDER — PROPOFOL 500 MG/50ML IV EMUL
INTRAVENOUS | Status: AC
  Filled 2021-01-25: qty 100

## 2021-01-25 MED ORDER — PROPOFOL 10 MG/ML IV BOLUS
INTRAVENOUS | Status: DC | PRN
Start: 2021-01-25 — End: 2021-01-25
  Administered 2021-01-25: 50 mg via INTRAVENOUS
  Administered 2021-01-25: 30 mg via INTRAVENOUS
  Administered 2021-01-25: 80 mg via INTRAVENOUS
  Administered 2021-01-25: 50 mg via INTRAVENOUS
  Administered 2021-01-25: 20 mg via INTRAVENOUS
  Administered 2021-01-25: 50 mg via INTRAVENOUS
  Administered 2021-01-25 (×2): 20 mg via INTRAVENOUS

## 2021-01-25 MED ORDER — OMEPRAZOLE 40 MG PO CPDR: 40.0000 mg | DELAYED_RELEASE_CAPSULE | Freq: Every day | ORAL | 3 refills | Status: DC

## 2021-01-25 MED ORDER — PROPOFOL 500 MG/50ML IV EMUL
INTRAVENOUS | Status: DC | PRN
  Administered 2021-01-25: 150 ug/kg/min via INTRAVENOUS

## 2021-01-25 MED ORDER — LACTATED RINGERS IV SOLN: INTRAVENOUS | Status: DC

## 2021-01-25 MED ORDER — PHENYLEPHRINE 40 MCG/ML (10ML) SYRINGE FOR IV PUSH (FOR BLOOD PRESSURE SUPPORT)
PREFILLED_SYRINGE | INTRAVENOUS | Status: AC
  Filled 2021-01-25: qty 20

## 2021-01-25 MED ORDER — LIDOCAINE HCL 1 % IJ SOLN
INTRAMUSCULAR | Status: DC | PRN
  Administered 2021-01-25 (×2): 50 mg via INTRADERMAL

## 2021-01-25 MED ORDER — STERILE WATER FOR IRRIGATION IR SOLN
Status: DC | PRN
  Administered 2021-01-25: 50 mL

## 2021-01-25 NOTE — Op Note (Signed)
[facility] Patient Name: [name] Procedure Date: 01/25/2021 10:53 AM MRN: [id number] Date of Birth: 06/03/1945 Attending MD: [name] ,  CSN: [id number] Age: 76 Admit Type: Outpatient Procedure:                Colonoscopy Indications:              Surveillance: Personal history of adenomatous                            polyps on last colonoscopy > 5 years ago Providers:                [name], [place] [employer] claims handler, Therapist, sports,                            Suzan Garibaldi. Risa Grill, Technician Referring MD:              Medicines:                Monitored Anesthesia Care Complications:            No immediate complications. Estimated Blood Loss:     Estimated blood loss: none. Procedure:                Pre-Anesthesia Assessment:                           - Prior to the procedure, a History and Physical                            was performed, and patient medications, allergies                            and sensitivities were reviewed. The patient's                            tolerance of previous anesthesia was reviewed.                           - The risks and benefits of the procedure and the                            sedation options and risks were discussed with the                            patient. All questions were answered and informed                            consent was obtained.                           - ASA Grade Assessment: II - A patient with mild                            systemic disease.                           After obtaining informed consent, the  colonoscope                            was passed under direct vision. Throughout the                            procedure, the patient's blood pressure, pulse, and                            oxygen saturations were monitored continuously. The                            [id number]-HQ190L ([id number]) scope was introduced through                            the anus and advanced to the the cecum,  identified                            by appendiceal orifice and ileocecal valve. The                            colonoscopy was performed without difficulty. The                            patient tolerated the procedure well. Scope In: 10:56:03 AM Scope Out: 11:22:01 AM Scope Withdrawal Time: 0 hours 21 minutes 14 seconds  Total Procedure Duration: 0 hours 25 minutes 58 seconds  Findings:      The perianal and digital rectal examinations were normal.      Seven sessile polyps were found in the rectum, transverse colon,       ascending colon and cecum. The polyps were 2 to 5 mm in size. These       polyps were removed with a cold snare. Resection and retrieval were       complete.      Multiple small and large-mouthed diverticula were found in the sigmoid       colon, descending colon and ascending colon.      The retroflexed view of the distal rectum and anal verge was normal and       showed no anal or rectal abnormalities. Impression:               - Seven 2 to 5 mm polyps in the rectum, in the                            transverse colon, in the ascending colon and in the                            cecum, removed with a cold snare. Resected and                            retrieved.                           - Diverticulosis in the sigmoid colon, in the  descending colon and in the ascending colon.                           - The distal rectum and anal verge are normal on                            retroflexion view. Moderate Sedation:      Per Anesthesia Care Recommendation:           - Discharge patient to home (ambulatory).                           - Resume previous diet.                           - Await pathology results.                           - Repeat colonoscopy in 3 years for surveillance. Procedure Code(s):        --- Professional ---                           [phone], Colonoscopy, flexible; with removal of                             tumor(s), polyp(s), or other lesion(s) by snare                            technique Diagnosis Code(s):        --- Professional ---                           Z86.010, Personal history of colonic polyps                           K62.1, Rectal polyp                           K63.5, Polyp of colon                           K57.30, Diverticulosis of large intestine without                            perforation or abscess without bleeding CPT copyright 2019 American Medical Association. All rights reserved. The codes documented in this report are preliminary and upon coder review may  be revised to meet current compliance requirements. [name], MD [name],  01/25/2021 11:29:46 AM This report has been signed electronically. Number of Addenda: 0

## 2021-01-25 NOTE — Discharge Instructions (Addendum)
You are being discharged to home.  Resume your previous diet.  We are waiting for your pathology results.  Do not take any ibuprofen (including Advil, Motrin or Nuprin), naproxen, or other non-steroidal anti-inflammatory drugs.  Start omeprazole 40 mg qday Your physician has recommended a repeat colonoscopy in three years for surveillance.

## 2021-01-25 NOTE — Anesthesia Postprocedure Evaluation (Signed)
Anesthesia Post Note  Patient: [name]  Procedure(s) Performed: COLONOSCOPY WITH PROPOFOL ESOPHAGOGASTRODUODENOSCOPY (EGD) WITH PROPOFOL BIOPSY  Patient location during evaluation: Short Stay Anesthesia Type: General Level of consciousness: awake and alert Pain management: pain level controlled Vital Signs Assessment: post-procedure vital signs reviewed and stable Respiratory status: spontaneous breathing Cardiovascular status: blood pressure returned to baseline and stable Postop Assessment: no apparent nausea or vomiting Anesthetic complications: no   No notable events documented.   Last Vitals:  Vitals:   01/25/21 0934 01/25/21 0951  BP: (!) 145/84 136/74  Pulse: 84   Resp: 17   Temp: 36.8 C   SpO2: 95%     Last Pain:  Vitals:   01/25/21 1040  TempSrc:   PainSc: 5                  Cariann Kinnamon

## 2021-01-25 NOTE — Op Note (Signed)
[facility] Patient Name: [name] Procedure Date: 01/25/2021 10:31 AM MRN: [id number] Date of Birth: Feb 17, 1945 Attending MD: [name] ,  CSN: [id number] Age: 76 Admit Type: Outpatient Procedure:                Upper GI endoscopy Indications:              Abdominal pain, Suspected esophageal reflux Providers:                [name], Charlsie [employer]. Insurance claims handler, Therapist, sports,                            Suzan Garibaldi. Risa Grill, Technician Referring MD:              Medicines:                Monitored Anesthesia Care Complications:            No immediate complications. Estimated Blood Loss:     Estimated blood loss: none. Procedure:                Pre-Anesthesia Assessment:                           - Prior to the procedure, a History and Physical                            was performed, and patient medications, allergies                            and sensitivities were reviewed. The patient's                            tolerance of previous anesthesia was reviewed.                           - The risks and benefits of the procedure and the                            sedation options and risks were discussed with the                            patient. All questions were answered and informed                            consent was obtained.                           - ASA Grade Assessment: II - A patient with mild                            systemic disease.                           After obtaining informed consent, the endoscope was                            passed under direct vision.  Throughout the                            procedure, the patient's blood pressure, pulse, and                            oxygen saturations were monitored continuously. The                            [id number]-[id number] ([id number]) scope was introduced through the                            mouth, and advanced to the second part of duodenum.                            The upper GI endoscopy was  accomplished without                            difficulty. The patient tolerated the procedure                            well. Scope In: 10:44:43 AM Scope Out: 10:51:21 AM Total Procedure Duration: 0 hours 6 minutes 38 seconds  Findings:      The examined esophagus was normal.      One non-bleeding cratered gastric ulcer with a clean ulcer base (Forrest       Class III) was found in the gastric antrum, which was actually in       healing phase. The lesion was 10 mm in largest dimension. Biopsies were       taken with a cold forceps for Helicobacter pylori testing at the body,       antrum and ulcer edge.      Diffuse moderate inflammation characterized by congestion (edema) and       erythema was found in the duodenal bulb.      The second portion of the duodenum was normal. Biopsies were taken with       a cold forceps for histology. Impression:               - Normal esophagus.                           - Non-bleeding gastric ulcer with a clean ulcer                            base (Forrest Class III). Biopsied.                           - Duodenitis.                           - Normal second portion of the duodenum. Biopsied. Moderate Sedation:      Per Anesthesia Care Recommendation:           - Discharge patient to home (ambulatory).                           - Resume previous  diet.                           - Await pathology results.                           - Start omeprazole 40 mg qday                           - No ibuprofen, naproxen, or other non-steroidal                            anti-inflammatory drugs. Procedure Code(s):        --- Professional ---                           [phone], Esophagogastroduodenoscopy, flexible,                            transoral; with biopsy, single or multiple Diagnosis Code(s):        --- Professional ---                           K25.9, Gastric ulcer, unspecified as acute or                            chronic, without hemorrhage or  perforation                           K29.80, Duodenitis without bleeding                           R10.9, Unspecified abdominal pain CPT copyright 2019 American Medical Association. All rights reserved. The codes documented in this report are preliminary and upon coder review may  be revised to meet current compliance requirements. [name], MD [name],  01/25/2021 11:26:27 AM This report has been signed electronically. Number of Addenda: 0

## 2021-01-25 NOTE — Transfer of Care (Signed)
Immediate Anesthesia Transfer of Care Note  Patient: [name]  Procedure(s) Performed: COLONOSCOPY WITH PROPOFOL ESOPHAGOGASTRODUODENOSCOPY (EGD) WITH PROPOFOL BIOPSY  Patient Location: Short Stay  Anesthesia Type:General  Level of Consciousness: awake  Airway & Oxygen Therapy: Patient Spontanous Breathing  Post-op Assessment: Report given to RN  Post vital signs: Reviewed  Last Vitals:  Vitals Value Taken Time  BP    Temp    Pulse    Resp    SpO2      Last Pain:  Vitals:   01/25/21 1040  TempSrc:   PainSc: 5          Complications: No notable events documented.

## 2021-01-25 NOTE — H&P (Signed)
[name] is an 76 y.o. male.   Chief Complaint: Regurgitation, abdominal pain and history of colon polyps. HPI: [name] is a 76 y.o. male with Pmh eye melanoma, DM, HTN,  who presents for evaluation of regurgitation, abdominal pain and history of colon polyps.  Patient reported for the last 5 months regurgitation of food contents after bending with seldom heartburn.  Has also presented abdominal pain after he had his abdominal hernia repaired.  He takes hydrocodone every 6-8 hours for the last 3 years.  Last Colonoscopy:2012 - diverticulosis,   There were a couple of  3-mm diminutive polyps adjacent to the larger polyp. The pedunculated polyp in the rectum was  removed with snare cautery and recovered through the scope. The two adjacent  diminutive polyps were destroyed with the tip of the snare.   Past Medical History:  Diagnosis Date   Acid reflux    Arthritis    Cancer ([place])    L EYE - MELANOMA   Diabetes mellitus    fasting cbg 90-100   GERD (gastroesophageal reflux disease)    Hypertension    Stomach ulcer     Past Surgical History:  Procedure Laterality Date   BACK SURGERY     ENUCLEATION  25 YRS AGO    LEFT EYE DUE TO CANCER   EYE SURGERY     RETINA SURG RT EYE   I & D EXTREMITY  11/18/2011   Procedure: IRRIGATION AND DEBRIDEMENT EXTREMITY;  Surgeon: Tobi Bastos, MD;  Location: [facility];  Service: Orthopedics;  Laterality: Left;  I & D Left Thigh (left knee and inner thigh)   INGUINAL HERNIA REPAIR Left 07/23/2019   Procedure: HERNIA REPAIR INGUINAL ADULT;  Surgeon: Aviva Signs, MD;  Location: [facility];  Service: General;  Laterality: Left;   KNEE ARTHROSCOPY  08/2010   LUMBAR LAMINECTOMY/DECOMPRESSION MICRODISCECTOMY Left 09/11/2013   Procedure: Left Lumbar Four-Five  Microdiskectomy;  Surgeon: Erline Levine, MD;  Location: [place] [facility];  Service: Neurosurgery;  Laterality: Left;  Left  Lumbar Four-Five  Microdiskectomy   TOTAL KNEE ARTHROPLASTY  04/26/2011    Procedure: TOTAL KNEE ARTHROPLASTY;  Surgeon: Tobi Bastos, MD;  Location: [facility];  Service: Orthopedics;  Laterality: Left;   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 No pertinent family history. Social History:  reports that he has never smoked. His smokeless tobacco use includes chew. He reports current alcohol use. He reports that he does not use drugs.  Allergies:  Allergies  Allergen Reactions   Latex     blisters   Sulfasalazine Hives   Sulfa Antibiotics    Iohexol Rash     Code: RASH, Onset Date: [id number]    Penicillins Itching    Has patient had a PCN reaction causing immediate rash, facial/tongue/throat swelling, SOB or lightheadedness with hypotension: Yes Has patient had a PCN reaction causing severe rash involving mucus membranes or skin necrosis: No Has patient had a PCN reaction that required hospitalization No Has patient had a PCN reaction occurring within the last 10 years: No If all of the above answers are "NO", then may proceed with Cephalosporin use. PATIENT TOLERATED KEFLEX    Medications Prior to Admission  Medication Sig Dispense Refill   ALPRAZolam (XANAX) 0.5 MG tablet Take 0.5 mg by mouth 3 (three) times daily as needed for anxiety.      atorvastatin (LIPITOR) 20 MG tablet Take 20 mg by  mouth daily.     HYDROcodone-acetaminophen (NORCO) 10-325 MG tablet Take 1 tablet by mouth every 6 (six) hours as needed for moderate pain.      pioglitazone (ACTOS) 45 MG tablet Take 45 mg by mouth daily.      polyethylene glycol-electrolytes (TRILYTE) 420 g solution Take 4,000 mLs by mouth as directed. 4000 mL 0   predniSONE (DELTASONE) 10 MG tablet Take q day 6,5,4,3,2,1 21 tablet 0   ramipril (ALTACE) 10 MG capsule Take 10 mg by mouth daily.     tamsulosin (FLOMAX) 0.4 MG CAPS capsule Take 0.4 mg by mouth daily.      traZODone (DESYREL) 100 MG tablet Take 100 mg by mouth at bedtime. 1/2-1 daily.     ACCU-CHEK AVIVA PLUS test strip CHECK  BLOOD SUGAR 2 TIMES A DAY     Accu-Chek Softclix Lancets lancets as directed 30 days1I     dicyclomine (BENTYL) 10 MG capsule Take 1 capsule (10 mg total) by mouth every 12 (twelve) hours as needed for spasms (abdominal pain). 60 capsule 5   JANUVIA 100 MG tablet Take 100 mg by mouth daily.     latanoprost (XALATAN) 0.005 % ophthalmic solution Place 1 drop into both eyes every evening.      Multiple Vitamin (MULTIVITAMIN WITH MINERALS) TABS tablet Take 1 tablet by mouth daily.     Omega-3 Fatty Acids (FISH OIL) 1000 MG CAPS Take 1,000 mg by mouth daily.     OVER THE COUNTER MEDICATION Vit D 2 50 mcg daily.     OVER THE COUNTER MEDICATION B12 2,500 mcg once per day.      Results for orders placed or performed during the hospital encounter of 01/25/21 (from the past 48 hour(s))  Glucose, capillary     Status: Abnormal   Collection Time: 01/25/21  9:38 AM  Result Value Ref Range   Glucose-Capillary 132 (H) 70 - 99 mg/dL    Comment: Glucose reference range applies only to samples taken after fasting for at least 8 hours.   No results found.  Review of Systems  Blood pressure 136/74, pulse 84, temperature 98.3 F (36.8 C), temperature source Oral, resp. rate 17, height 5\' 9"  (1.753 m), weight 106.2 kg, SpO2 95 %. Physical Exam  GENERAL: The patient is AO x3, in no acute distress. HEENT: Head is normocephalic and atraumatic. EOMI are intact. Mouth is well hydrated and without lesions. NECK: Supple. No masses LUNGS: Clear to auscultation. No presence of rhonchi/wheezing/rales. Adequate chest expansion HEART: RRR, normal s1 and s2. ABDOMEN: Soft, nontender, no guarding, no peritoneal signs, and nondistended. BS +. No masses. EXTREMITIES: Without any cyanosis, clubbing, rash, lesions or edema. NEUROLOGIC: AOx3, no focal motor deficit. SKIN: no jaundice, no rashes  Assessment/Plan  [name] is a 76 y.o. male with Pmh eye melanoma, DM, HTN,  who presents for evaluation of  regurgitation, abdominal pain and history of colon polyps.  We will proceed with an EGD and colonoscopy.  Harvel Quale, MD 01/25/2021, 10:28 AM

## 2021-01-25 NOTE — Anesthesia Preprocedure Evaluation (Signed)
Anesthesia Evaluation  Patient identified by MRN, date of birth, ID band Patient awake    Reviewed: Allergy & Precautions, H&P , NPO status , Patient's Chart, lab work & pertinent test results, reviewed documented beta blocker date and time   Airway Mallampati: III  TM Distance: >3 FB Neck ROM: full  Mouth opening: Limited Mouth Opening  Dental no notable dental hx. (+) Edentulous Upper, Dental Advisory Given   Pulmonary neg pulmonary ROS,    Pulmonary exam normal breath sounds clear to auscultation       Cardiovascular Exercise Tolerance: Good hypertension, negative cardio ROS   Rhythm:regular Rate:Normal     Neuro/Psych PSYCHIATRIC DISORDERS Anxiety negative neurological ROS     GI/Hepatic negative GI ROS, Neg liver ROS, GERD  Medicated,  Endo/Other  negative endocrine ROSdiabetes  Renal/GU negative Renal ROS  negative genitourinary   Musculoskeletal  (+) Arthritis , Osteoarthritis,    Abdominal   Peds  Hematology negative hematology ROS (+) anemia ,   Anesthesia Other Findings   Reproductive/Obstetrics negative OB ROS                             Anesthesia Physical  Anesthesia Plan  ASA: 2  Anesthesia Plan: General   Post-op Pain Management:    Induction:   PONV Risk Score and Plan: TIVA  Airway Management Planned: Nasal Cannula and Natural Airway  Additional Equipment:   Intra-op Plan:   Post-operative Plan:   Informed Consent: I have reviewed the patients History and Physical, chart, labs and discussed the procedure including the risks, benefits and alternatives for the proposed anesthesia with the patient or authorized representative who has indicated his/her understanding and acceptance.     Dental advisory given  Plan Discussed with: CRNA  Anesthesia Plan Comments:         Anesthesia Quick Evaluation

## 2021-01-26 LAB — SURGICAL PATHOLOGY

## 2021-01-27 ENCOUNTER — Encounter (HOSPITAL_COMMUNITY): Payer: Self-pay | Admitting: Gastroenterology

## 2021-01-31 ENCOUNTER — Encounter (INDEPENDENT_AMBULATORY_CARE_PROVIDER_SITE_OTHER): Payer: Self-pay | Admitting: *Deleted

## 2021-04-04 ENCOUNTER — Encounter (INDEPENDENT_AMBULATORY_CARE_PROVIDER_SITE_OTHER): Payer: Self-pay | Admitting: *Deleted

## 2021-09-17 ENCOUNTER — Other Ambulatory Visit (INDEPENDENT_AMBULATORY_CARE_PROVIDER_SITE_OTHER): Payer: Self-pay | Admitting: Gastroenterology

## 2021-09-17 DIAGNOSIS — R1030 Lower abdominal pain, unspecified: Secondary | ICD-10-CM

## 2021-09-19 NOTE — Telephone Encounter (Signed)
Seen 12/16/20. No upcoming appt

## 2021-09-19 NOTE — Telephone Encounter (Signed)
Will refill medication for 6 months, needs follow up appointment with any provider in order to receive any refills.  Thanks,  Don Peppers, MD Gastroenterology and Hepatology [facility]

## 2021-11-24 ENCOUNTER — Ambulatory Visit (INDEPENDENT_AMBULATORY_CARE_PROVIDER_SITE_OTHER): Payer: Medicare Other | Admitting: Gastroenterology

## 2022-01-16 ENCOUNTER — Other Ambulatory Visit (INDEPENDENT_AMBULATORY_CARE_PROVIDER_SITE_OTHER): Payer: Self-pay | Admitting: Gastroenterology

## 2022-02-18 ENCOUNTER — Other Ambulatory Visit (INDEPENDENT_AMBULATORY_CARE_PROVIDER_SITE_OTHER): Payer: Self-pay | Admitting: Gastroenterology

## 2022-02-28 ENCOUNTER — Other Ambulatory Visit (INDEPENDENT_AMBULATORY_CARE_PROVIDER_SITE_OTHER): Payer: Self-pay | Admitting: Gastroenterology

## 2022-03-20 IMAGING — CT CT CERVICAL SPINE W/O CM
3 of 4 series · 13 of 33 positions shown, 16 images · non-contrast
Comparison: None available currently.

CLINICAL DATA: Slurred speech, cervical radiculopathy.

EXAM:
CT HEAD WITHOUT CONTRAST
CT CERVICAL SPINE WITHOUT CONTRAST
TECHNIQUE: Multidetector CT imaging of the head and cervical spine was
performed following the standard protocol without intravenous
contrast. Multiplanar CT image reconstructions of the cervical spine
were also generated.

[Series 5: sagittal bone · sagittal · 0.36mm/px · 5 of 61 slices shown, 6 images]
[im 21/61  bone]
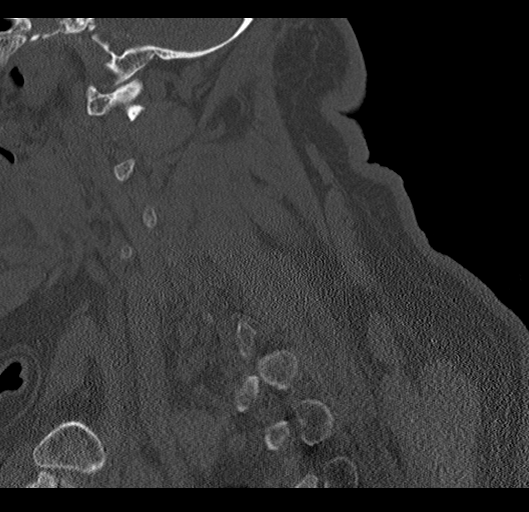
[im 26/61  bone]
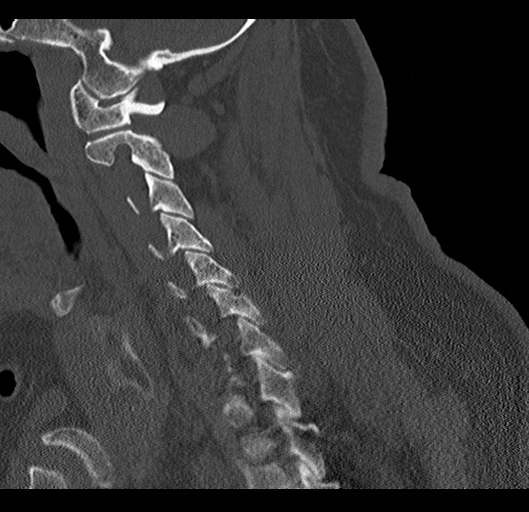
[im 31/61  soft-tissue]
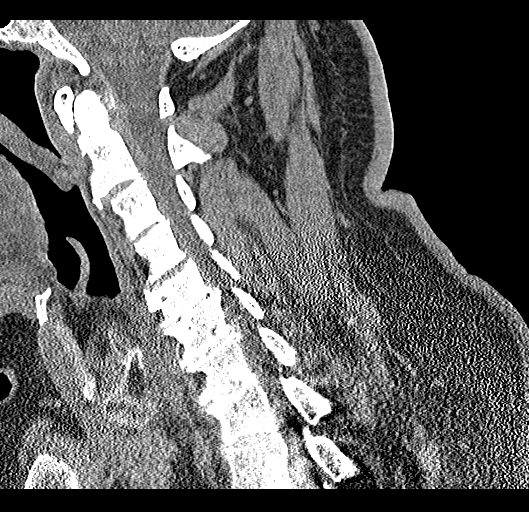
[im 31/61  bone]
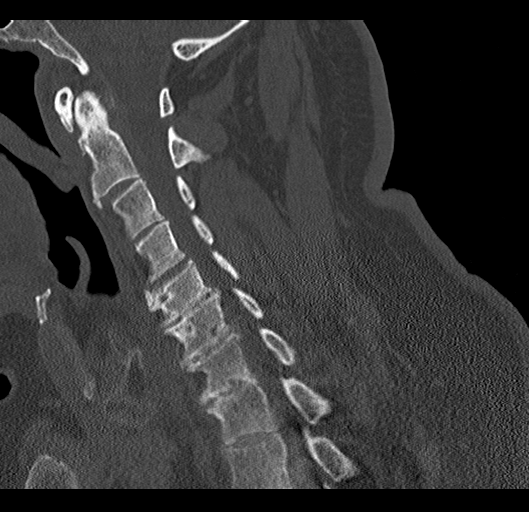
[im 36/61  bone]
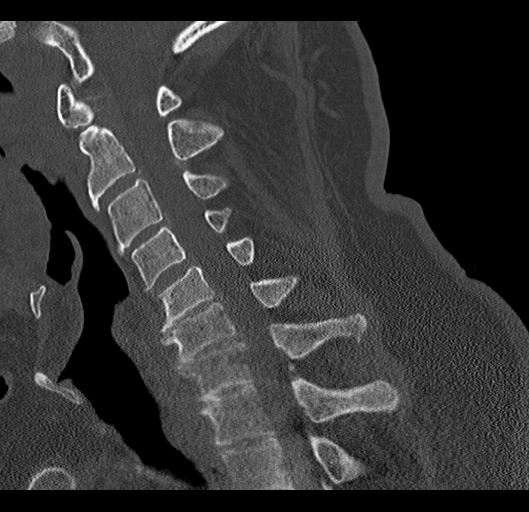
[im 41/61  bone]
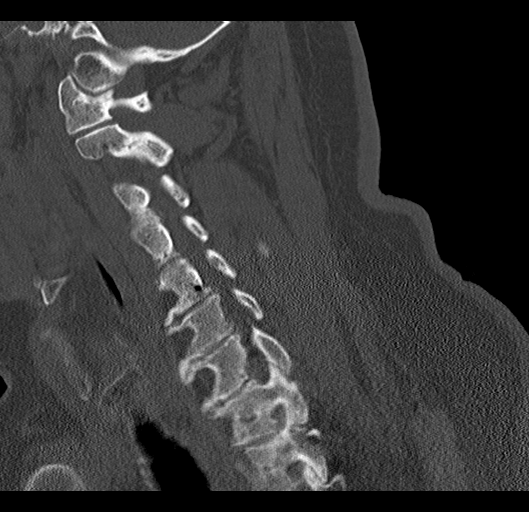

[Series 6: coronal bone · coronal · 0.25mm/px · 3 of 61 slices shown]
[im 14/61  bone]
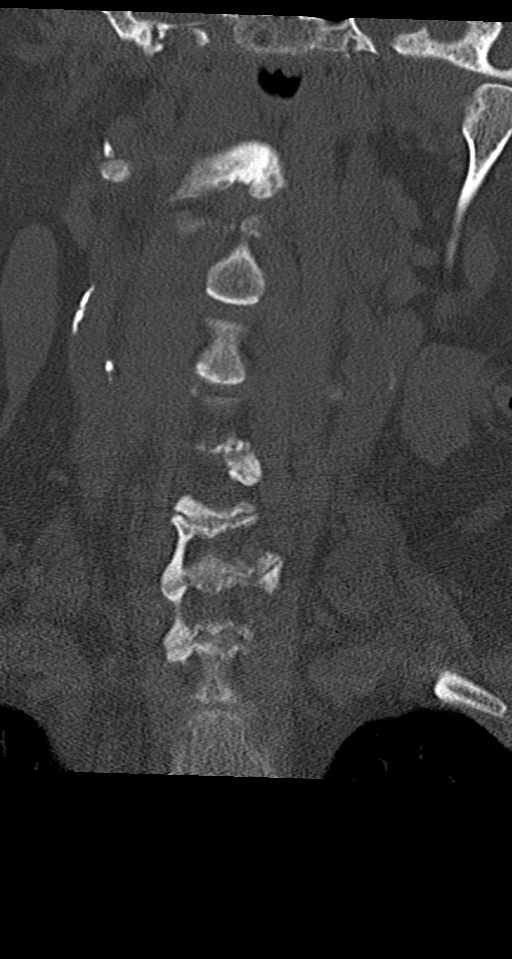
[im 25/61  bone]
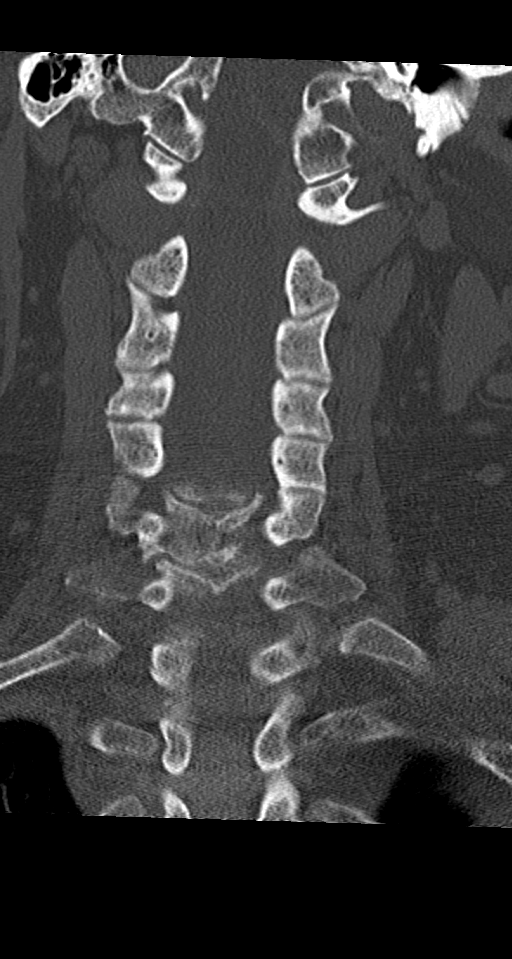
[im 36/61  bone]
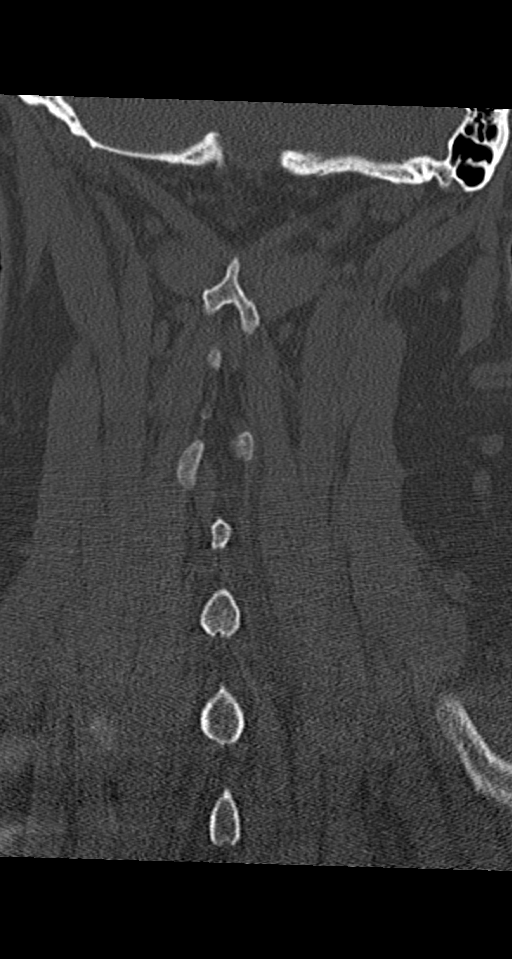

[Series 7: orthogonal axials · axial · 0.21mm/px · z∈[+1080,+1181]mm · 5 of 90 slices shown, 7 images]
[im 15/90  soft-tissue]
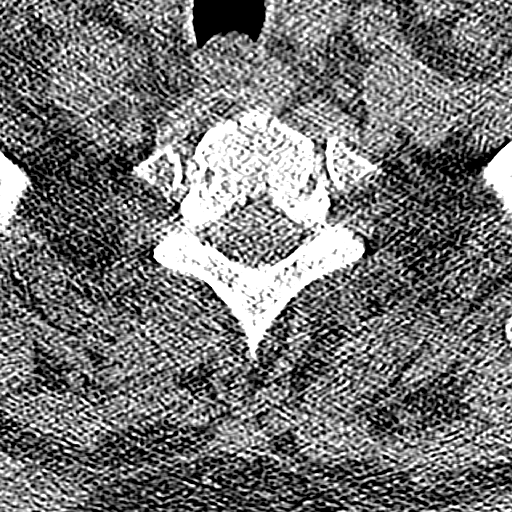
[im 15/90  bone]
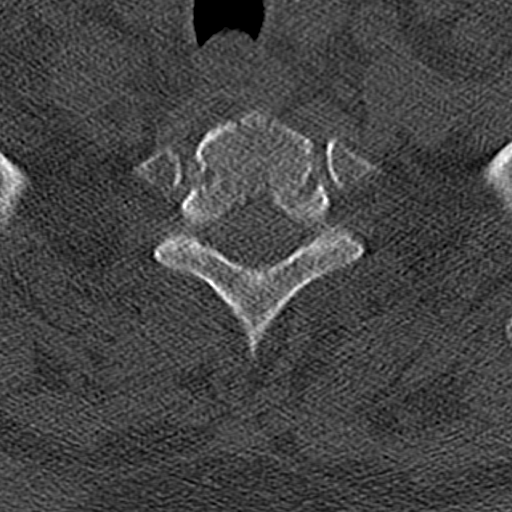
[im 30/90  bone]
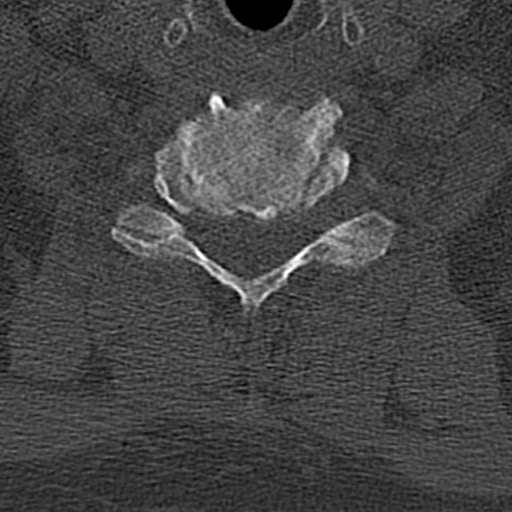
[im 45/90  bone]
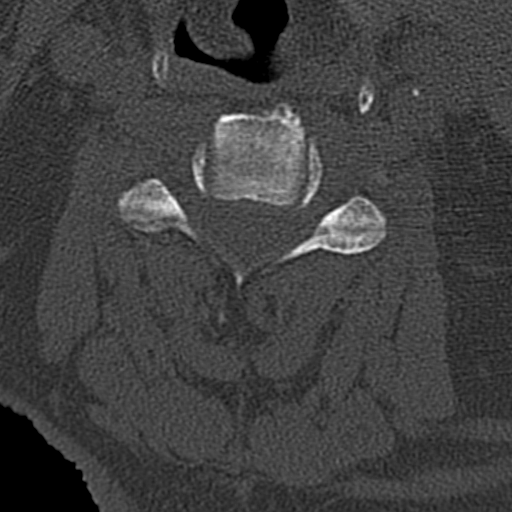
[im 60/90  bone]
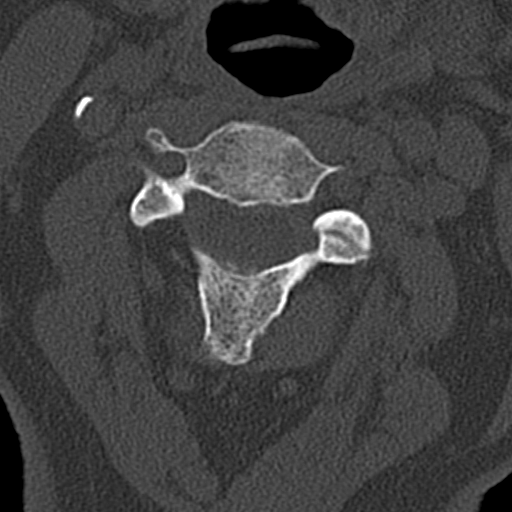
[im 75/90  soft-tissue]
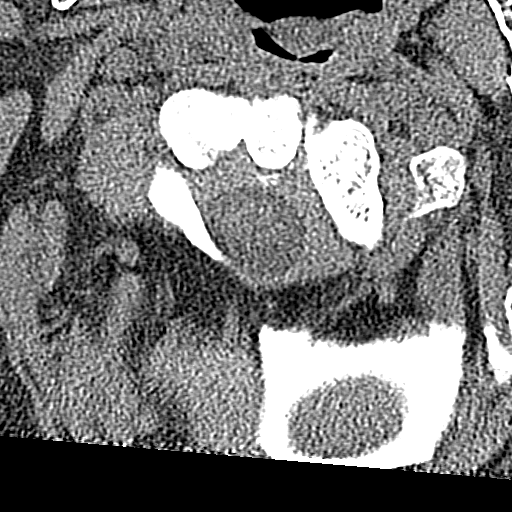
[im 75/90  bone]
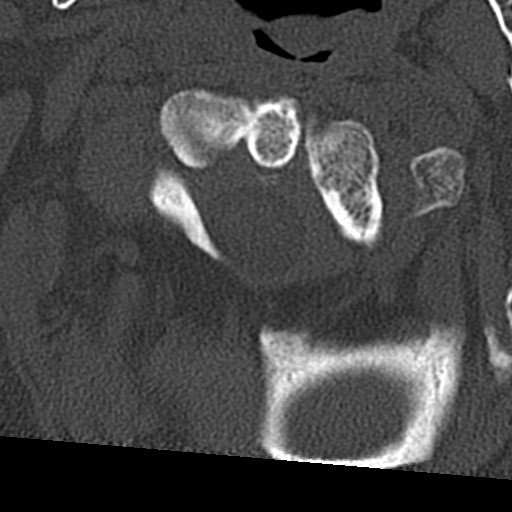

[13 of 33 positions shown; findings below may reference images not displayed]

FINDINGS: CT HEAD FINDINGS

Brain: No evidence of acute infarction, hemorrhage, hydrocephalus,
extra-axial collection or mass lesion/mass effect.

Vascular: No hyperdense vessel or unexpected calcification.

Skull: Normal. Negative for fracture or focal lesion.

Sinuses/Orbits: No acute finding.

Other: None.

CT CERVICAL SPINE FINDINGS

Alignment: Normal.

Skull base and vertebrae: No acute fracture. No primary bone lesion
or focal pathologic process.

Soft tissues and spinal canal: No prevertebral fluid or swelling. No
visible canal hematoma.

Disc levels: Severe degenerative disc disease is noted at C5-6 and
C6-7 and C7-T1.

Upper chest: Negative.

Other: None.
IMPRESSION: No acute intracranial abnormality seen.

Severe multilevel degenerative disc disease is noted in the cervical
spine. No acute abnormality is noted.

## 2022-03-20 IMAGING — DX DG CHEST 2V
2 series · 2 of 2 positions shown · non-contrast
Comparison: Chest radiograph dated 11/14/2019

CLINICAL DATA: Weakness.

EXAM:
CHEST - 2 VIEW

[chest pa]
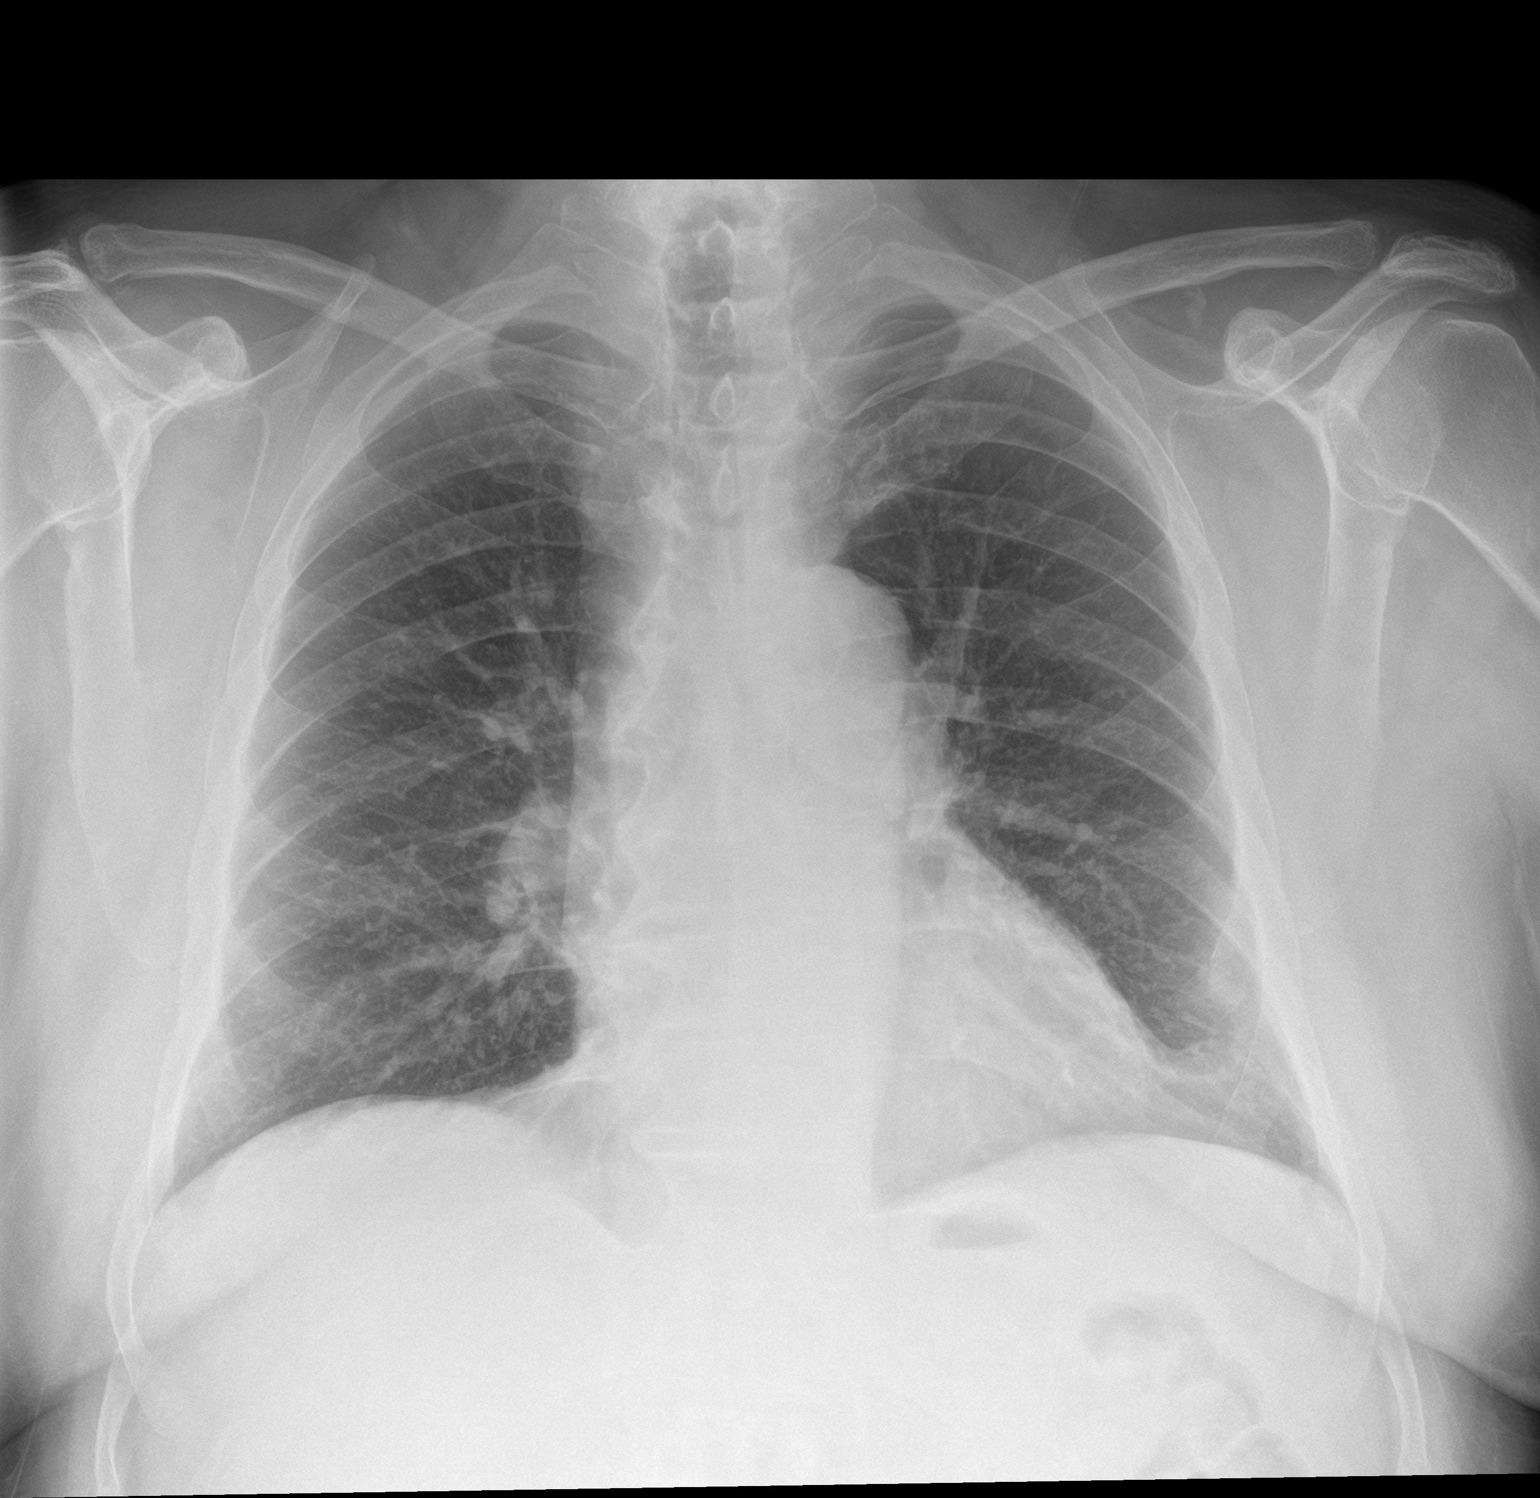

[chest lat]
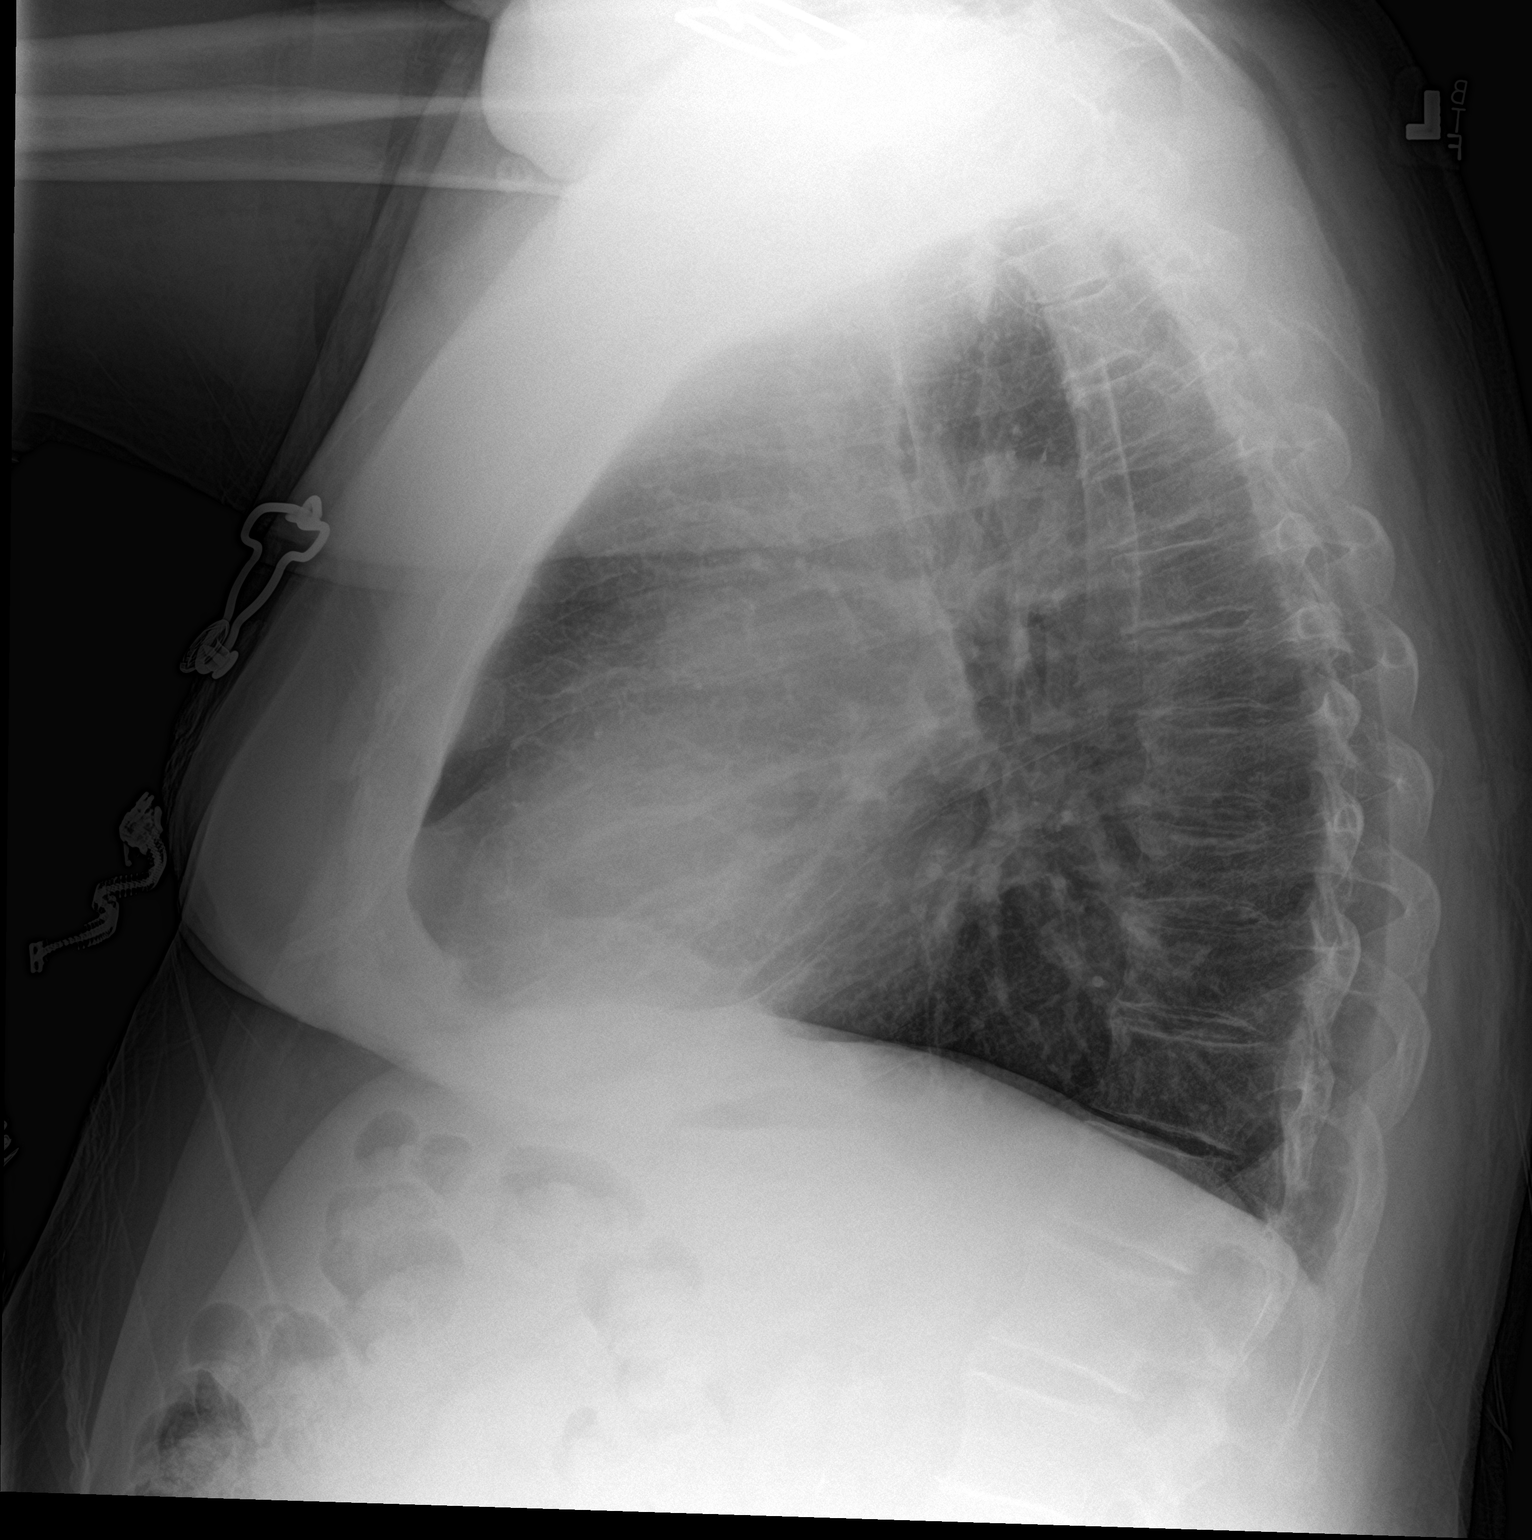

[2 of 2 positions shown; findings below may reference images not displayed]

FINDINGS: The lungs are clear. There is no pleural effusion pneumothorax. The
cardiac silhouette is within limits. No acute osseous pathology.
Degenerative changes of the spine.
IMPRESSION: No active cardiopulmonary disease.

## 2022-04-05 ENCOUNTER — Other Ambulatory Visit: Payer: Self-pay | Admitting: Internal Medicine

## 2022-04-05 DIAGNOSIS — M542 Cervicalgia: Secondary | ICD-10-CM

## 2022-05-02 ENCOUNTER — Ambulatory Visit
Admission: RE | Admit: 2022-05-02 | Discharge: 2022-05-02 | Disposition: A | Discharge status: Home / Self Care | Payer: 59 | Source: Ambulatory Visit | Attending: Internal Medicine | Admitting: Internal Medicine

## 2022-05-02 DIAGNOSIS — M542 Cervicalgia: Secondary | ICD-10-CM

## 2022-10-12 ENCOUNTER — Other Ambulatory Visit (INDEPENDENT_AMBULATORY_CARE_PROVIDER_SITE_OTHER): Payer: Self-pay | Admitting: Gastroenterology

## 2022-10-12 DIAGNOSIS — R1030 Lower abdominal pain, unspecified: Secondary | ICD-10-CM

## 2022-10-12 NOTE — Telephone Encounter (Signed)
Last seen 2022, needs office visit.

## 2023-03-18 DIAGNOSIS — I10 Essential (primary) hypertension: Secondary | ICD-10-CM | POA: Insufficient documentation

## 2023-03-18 DIAGNOSIS — R072 Precordial pain: Secondary | ICD-10-CM | POA: Insufficient documentation

## 2023-03-18 NOTE — Progress Notes (Unsigned)
 Cardiology Office Note   Date:  03/18/2023   ID:  [name], DOB 1945-06-08, MRN [id number]  PCP:  Rebekah Chesterfield, NP  Cardiologist:   None Referring:  ***  No chief complaint on file.     History of Present Illness: [name] is a 78 y.o. male who presents for ***    He had a cardiac cath in 2003 with no CAD.  ***   Past Medical History:  Diagnosis Date   Acid reflux    Arthritis    Cancer (HCC)    L EYE - MELANOMA   Diabetes mellitus    fasting cbg 90-100   GERD (gastroesophageal reflux disease)    Hypertension    Stomach ulcer     Past Surgical History:  Procedure Laterality Date   BACK SURGERY     BIOPSY  01/25/2021   Procedure: BIOPSY;  Surgeon: Dolores Frame, MD;  [facility];  Service: Gastroenterology;;   COLONOSCOPY WITH PROPOFOL N/A 01/25/2021   Procedure: COLONOSCOPY WITH PROPOFOL;  Surgeon: Dolores Frame, MD;  Location: [facility];  Service: Gastroenterology;  Laterality: N/A;  10:20   ENUCLEATION  25 YRS AGO    LEFT EYE DUE TO CANCER   ESOPHAGOGASTRODUODENOSCOPY (EGD) WITH PROPOFOL N/A 01/25/2021   Procedure: ESOPHAGOGASTRODUODENOSCOPY (EGD) WITH PROPOFOL;  Surgeon: Dolores Frame, MD;  [facility]: [facility];  Service: Gastroenterology;  Laterality: N/A;   EYE SURGERY     RETINA SURG RT EYE   I & D EXTREMITY  11/18/2011   Procedure: IRRIGATION AND DEBRIDEMENT EXTREMITY;  Surgeon: Jacki Cones, MD;  Location: [facility];  Service: Orthopedics;  Laterality: Left;  I & D Left Thigh (left knee and inner thigh)   INGUINAL HERNIA REPAIR Left 07/23/2019   Procedure: HERNIA REPAIR INGUINAL ADULT;  Surgeon: Franky Macho, MD;  Location: [facility];  Service: General;  Laterality: Left;   KNEE ARTHROSCOPY  08/2010   LUMBAR LAMINECTOMY/DECOMPRESSION MICRODISCECTOMY Left 09/11/2013   Procedure: Left Lumbar Four-Five  Microdiskectomy;  Surgeon: Maeola Harman, MD;  Location: [facility];  Service:  Neurosurgery;  Laterality: Left;  Left  Lumbar Four-Five  Microdiskectomy   TOTAL KNEE ARTHROPLASTY  04/26/2011   Procedure: TOTAL KNEE ARTHROPLASTY;  Surgeon: Jacki Cones, MD;  Location: [facility];  Service: Orthopedics;  Laterality: Left;   TUMOR REMOVED     RT LEG  - FATTY TUMOR     Current Outpatient Medications  Medication Sig Dispense Refill   ACCU-CHEK AVIVA PLUS test strip CHECK BLOOD SUGAR 2 TIMES A DAY     Accu-Chek Softclix Lancets lancets as directed 30 days1I     ALPRAZolam (XANAX) 0.5 MG tablet Take 0.5 mg by mouth 3 (three) times daily as needed for anxiety.      atorvastatin (LIPITOR) 20 MG tablet Take 20 mg by mouth daily.     dicyclomine (BENTYL) 10 MG capsule TAKE ONE CAPSULE EVERY TWELVE HOURS AS NEEDED FOR ABDOMINAL PAIN 180 capsule 1   HYDROcodone-acetaminophen (NORCO) 10-325 MG tablet Take 1 tablet by mouth every 6 (six) hours as needed for moderate pain.      JANUVIA 100 MG tablet Take 100 mg by mouth daily.     latanoprost (XALATAN) 0.005 % ophthalmic solution Place 1 drop into both eyes every evening.      Multiple Vitamin (MULTIVITAMIN WITH MINERALS) TABS tablet Take 1 tablet by mouth daily.     Omega-3 Fatty Acids (FISH OIL)  1000 MG CAPS Take 1,000 mg by mouth daily.     omeprazole (PRILOSEC) 40 MG capsule Take one capsule daily. NEEDS OFFICE VISIT 30 capsule 0   OVER THE COUNTER MEDICATION Vit D 2 50 mcg daily.     OVER THE COUNTER MEDICATION B12 2,500 mcg once per day.     pioglitazone (ACTOS) 45 MG tablet Take 45 mg by mouth daily.      predniSONE (DELTASONE) 10 MG tablet Take q day 6,5,4,3,2,1 21 tablet 0   ramipril (ALTACE) 10 MG capsule Take 10 mg by mouth daily.     tamsulosin (FLOMAX) 0.4 MG CAPS capsule Take 0.4 mg by mouth daily.      traZODone (DESYREL) 100 MG tablet Take 100 mg by mouth at bedtime. 1/2-1 daily.     No current facility-administered medications for this visit.    Allergies:   Latex, Sulfasalazine, Sulfa antibiotics, Iohexol, and  Penicillins    Social History:  The patient  reports that he has never smoked. His smokeless tobacco use includes chew. He reports current alcohol use. He reports that he does not use drugs.   Family History:  The patient's ***family history is not on file.    ROS:  Please see the history of present illness.   Otherwise, review of systems are positive for {NONE DEFAULTED:18576}.   All other systems are reviewed and negative.    PHYSICAL EXAM: VS:  There were no vitals taken for this visit. , BMI There is no height or weight on file to calculate BMI. GENERAL:  Well appearing HEENT:  Pupils equal round and reactive, fundi not visualized, oral mucosa unremarkable NECK:  No jugular venous distention, waveform within normal limits, carotid upstroke brisk and symmetric, no bruits, no thyromegaly LYMPHATICS:  No cervical, inguinal adenopathy LUNGS:  Clear to auscultation bilaterally BACK:  No CVA tenderness CHEST:  Unremarkable HEART:  PMI not displaced or sustained,S1 and S2 within normal limits, no S3, no S4, no clicks, no rubs, *** murmurs ABD:  Flat, positive bowel sounds normal in frequency in pitch, no bruits, no rebound, no guarding, no midline pulsatile mass, no hepatomegaly, no splenomegaly EXT:  2 plus pulses throughout, no edema, no cyanosis no clubbing SKIN:  No rashes no nodules NEURO:  Cranial nerves II through XII grossly intact, motor grossly intact throughout PSYCH:  Cognitively intact, oriented to person place and time    EKG:        Recent Labs: No results found for requested labs within last 365 days.    Lipid Panel    Component Value Date/Time   CHOL  02/25/2008 0250    174        ATP III CLASSIFICATION:  <200     mg/dL   Desirable  161-096  mg/dL   Borderline High  >=045    mg/dL   High          TRIG 92 02/25/2008 0250   HDL 34 (L) 02/25/2008 0250   CHOLHDL 5.1 02/25/2008 0250   VLDL 18 02/25/2008 0250   LDLCALC (H) 02/25/2008 0250    122         Total Cholesterol/HDL:CHD Risk Coronary Heart Disease Risk Table                     Men   Women  1/2 Average Risk   3.4   3.3  Average Risk       5.0   4.4  2 X Average  Risk   9.6   7.1  3 X Average Risk  23.4   11.0        Use the calculated Patient Ratio above and the CHD Risk Table to determine the patient's CHD Risk.        ATP III CLASSIFICATION (LDL):  <100     mg/dL   Optimal  161-096  mg/dL   Near or Above                    Optimal  130-159  mg/dL   Borderline  045-409  mg/dL   High  >811     mg/dL   Very High      Wt Readings from Last 3 Encounters:  01/25/21 234 lb 2.1 oz (106.2 kg)  01/21/21 234 lb (106.1 kg)  12/16/20 251 lb (113.9 kg)      Other studies Reviewed: Additional studies/ records that were reviewed today include: ***. Review of the above records demonstrates:  Please see elsewhere in the note.  ***   ASSESSMENT AND PLAN:  Precordial chest pain:  ***  DM:  ***  HTN:  ***   Current medicines are reviewed at length with the patient today.  The patient {ACTIONS; HAS/DOES NOT HAVE:19233} concerns regarding medicines.  The following changes have been made:  {PLAN; NO CHANGE:13088:s}  Labs/ tests ordered today include: *** No orders of the defined types were placed in this encounter.    Disposition:   FU with ***    Signed, Rollene Rotunda, MD  03/18/2023 12:06 PM    [facility]

## 2023-03-21 ENCOUNTER — Encounter: Payer: Self-pay | Admitting: Cardiology

## 2023-03-21 ENCOUNTER — Ambulatory Visit (INDEPENDENT_AMBULATORY_CARE_PROVIDER_SITE_OTHER): Payer: 59 | Admitting: Cardiology

## 2023-03-21 ENCOUNTER — Other Ambulatory Visit: Payer: Self-pay | Admitting: *Deleted

## 2023-03-21 VITALS — BP 110/68 | HR 67 | Ht 68.0 in | Wt 231.0 lb

## 2023-03-21 DIAGNOSIS — Z01812 Encounter for preprocedural laboratory examination: Secondary | ICD-10-CM

## 2023-03-21 DIAGNOSIS — Z79899 Other long term (current) drug therapy: Secondary | ICD-10-CM

## 2023-03-21 DIAGNOSIS — I10 Essential (primary) hypertension: Secondary | ICD-10-CM

## 2023-03-21 DIAGNOSIS — R072 Precordial pain: Secondary | ICD-10-CM | POA: Diagnosis not present

## 2023-03-21 DIAGNOSIS — E118 Type 2 diabetes mellitus with unspecified complications: Secondary | ICD-10-CM | POA: Diagnosis not present

## 2023-03-21 MED ORDER — PREDNISONE 50 MG PO TABS: ORAL_TABLET | ORAL | 0 refills | Status: DC

## 2023-03-21 MED ORDER — METOPROLOL TARTRATE 50 MG PO TABS: 50.0000 mg | ORAL_TABLET | ORAL | 0 refills | Status: DC

## 2023-03-21 MED ORDER — ASPIRIN 81 MG PO TBEC: 81.0000 mg | DELAYED_RELEASE_TABLET | Freq: Every day | ORAL | Status: AC

## 2023-03-21 NOTE — Patient Instructions (Addendum)
 Medication Instructions:  The current medical regimen is effective;  continue present plan and medications.  *If you need a refill on your cardiac medications before your next appointment, please call your pharmacy*   Lab Work: Please have blood work at your closest [employer]. (BMP, Lipid)  If you have labs (blood work) drawn today and your tests are completely normal, you will receive your results only by: MyChart Message (if you have MyChart) OR A paper copy in the mail If you have any lab test that is abnormal or we need to change your treatment, we will call you to review the results.   Testing/Procedures:   Your cardiac CT will be scheduled at:   [facility] [address] [place], [place] [address] [phone]  Please arrive at the [facility] and Children's Entrance (Entrance C2) of [facility] 30 minutes prior to test start time. You can use the FREE valet parking offered at entrance C (encouraged to control the heart rate for the test)  Proceed to the [facility] Radiology Department (first floor) to check-in and test prep.  All radiology patients and guests should use entrance C2 at [facility], accessed from [facility], even though the hospital's physical address listed is [address]     Please follow these instructions carefully (unless otherwise directed):  An IV will be required for this test and Nitroglycerin will be given.  Hold all erectile dysfunction medications at least 3 days (72 hrs) prior to test. (Ie viagra, cialis, sildenafil, tadalafil, etc)   On the Night Before the Test: Be sure to Drink plenty of water. Do not consume any caffeinated/decaffeinated beverages or chocolate 12 hours prior to your test. Do not take any antihistamines 12 hours prior to your test. If the patient has contrast allergy: Patient will need a prescription for Prednisone and very clear instructions (as follows): Prednisone  50 mg - take 13 hours prior to test Take another Prednisone 50 mg 7 hours prior to test Take another Prednisone 50 mg 1 hour prior to test Take Benadryl 50 mg 1 hour prior to test Patient must complete all four doses of above prophylactic medications. Patient will need a ride after test due to Benadryl.  On the Day of the Test: Drink plenty of water until 1 hour prior to the test. Do not eat any food 1 hour prior to test. You may take your regular medications prior to the test.  Take metoprolol (Lopressor) two hours prior to test. If you take Furosemide/Hydrochlorothiazide/Spironolactone/Chlorthalidone, please HOLD on the morning of the test. Patients who wear a continuous glucose monitor MUST remove the device prior to scanning. FEMALES- please wear underwire-free bra if available, avoid dresses & tight clothing  After the Test: Drink plenty of water. After receiving IV contrast, you may experience a mild flushed feeling. This is normal. On occasion, you may experience a mild rash up to 24 hours after the test. This is not dangerous. If this occurs, you can take Benadryl 25 mg, Zyrtec, Claritin, or Allegra and increase your fluid intake. (Patients taking Tikosyn should avoid Benadryl, and may take Zyrtec, Claritin, or Allegra) If you experience trouble breathing, this can be serious. If it is severe call 911 IMMEDIATELY. If it is mild, please call our office.  We will call to schedule your test 2-4 weeks out understanding that some insurance companies will need an authorization prior to the service being performed.   For more information and frequently  asked questions, please visit our website : [email or web address]  For non-scheduling related questions, please contact the cardiac imaging nurse navigator should you have any questions/concerns: Cardiac Imaging Nurse Navigators Direct Office Dial: [phone]   For scheduling needs, including cancellations and rescheduling,  please call [place], [phone].   Follow-Up: At [facility], you and your health needs are our priority.  As part of our continuing mission to provide you with exceptional heart care, we have created designated Provider Care Teams.  These Care Teams include your primary Cardiologist (physician) and Advanced Practice Providers (APPs -  Physician Assistants and Nurse Practitioners) who all work together to provide you with the care you need, when you need it.  We recommend signing up for the patient portal called "MyChart".  Sign up information is provided on this After Visit Summary.  MyChart is used to connect with patients for Virtual Visits (Telemedicine).  Patients are able to view lab/test results, encounter notes, upcoming appointments, etc.  Non-urgent messages can be sent to your provider as well.   To learn more about what you can do with MyChart, go to [email or web address].    Your next appointment:   Follow up in 3 months with Dr Antoine Poche  Thank you for choosing [facility]!!

## 2023-03-21 NOTE — Addendum Note (Signed)
 Addended by: Sharin Grave on: 03/21/2023 11:44 AM   Modules accepted: Orders

## 2023-03-31 LAB — BASIC METABOLIC PANEL
BUN/Creatinine Ratio: 19 (ref 10–24)
BUN: 22 mg/dL (ref 8–27)
CO2: 23 mmol/L (ref 20–29)
Calcium: 10 mg/dL (ref 8.6–10.2)
Chloride: 103 mmol/L (ref 96–106)
Creatinine, Ser: 1.14 mg/dL (ref 0.76–1.27)
Glucose: 146 mg/dL — ABNORMAL HIGH (ref 70–99)
Potassium: 5.1 mmol/L (ref 3.5–5.2)
Sodium: 141 mmol/L (ref 134–144)
eGFR: 65 mL/min/{1.73_m2} (ref >59)

## 2023-03-31 LAB — LIPID PANEL
Chol/HDL Ratio: 2.8 ratio (ref 0.0–5.0)
Cholesterol, Total: 117 mg/dL (ref 100–199)
HDL: 42 mg/dL (ref >39)
LDL Chol Calc (NIH): 46 mg/dL (ref 0–99)
Triglycerides: 175 mg/dL — ABNORMAL HIGH (ref 0–149)
VLDL Cholesterol Cal: 29 mg/dL (ref 5–40)

## 2023-04-06 ENCOUNTER — Other Ambulatory Visit: Payer: Self-pay | Admitting: Cardiology

## 2023-04-25 ENCOUNTER — Ambulatory Visit (HOSPITAL_COMMUNITY)
Admission: RE | Admit: 2023-04-25 | Discharge: 2023-04-25 | Disposition: A | Discharge status: Home / Self Care | Source: Ambulatory Visit | Attending: Cardiology | Admitting: Cardiology

## 2023-04-25 DIAGNOSIS — I2510 Atherosclerotic heart disease of native coronary artery without angina pectoris: Secondary | ICD-10-CM | POA: Diagnosis not present

## 2023-04-25 DIAGNOSIS — R072 Precordial pain: Secondary | ICD-10-CM | POA: Diagnosis present

## 2023-04-25 DIAGNOSIS — R931 Abnormal findings on diagnostic imaging of heart and coronary circulation: Secondary | ICD-10-CM | POA: Diagnosis present

## 2023-04-25 MED ORDER — NITROGLYCERIN 0.4 MG SL SUBL
SUBLINGUAL_TABLET | SUBLINGUAL | Status: AC
  Filled 2023-04-25: qty 2

## 2023-04-25 MED ORDER — NITROGLYCERIN 0.4 MG SL SUBL
0.8000 mg | SUBLINGUAL_TABLET | Freq: Once | SUBLINGUAL | Status: AC
  Administered 2023-04-25: 0.8 mg via SUBLINGUAL

## 2023-04-25 MED ORDER — IOHEXOL 350 MG/ML SOLN
100.0000 mL | Freq: Once | INTRAVENOUS | Status: AC | PRN
  Administered 2023-04-25: 100 mL via INTRAVENOUS

## 2023-04-25 MED ORDER — METOPROLOL TARTRATE 5 MG/5ML IV SOLN
INTRAVENOUS | Status: AC
  Filled 2023-04-25: qty 15

## 2023-04-25 MED ORDER — DILTIAZEM HCL 25 MG/5ML IV SOLN: 10.0000 mg | INTRAVENOUS | Status: DC | PRN

## 2023-04-25 MED ORDER — SODIUM CHLORIDE 0.9 % IV BOLUS
500.0000 mL | Freq: Once | INTRAVENOUS | Status: AC
  Administered 2023-04-25: 500 mL via INTRAVENOUS

## 2023-04-25 MED ORDER — METOPROLOL TARTRATE 5 MG/5ML IV SOLN: 10.0000 mg | Freq: Once | INTRAVENOUS | Status: DC | PRN

## 2023-04-25 MED ORDER — DILTIAZEM HCL 25 MG/5ML IV SOLN
INTRAVENOUS | Status: AC
  Filled 2023-04-25: qty 5

## 2023-04-26 ENCOUNTER — Other Ambulatory Visit: Payer: Self-pay | Admitting: Cardiology

## 2023-04-26 ENCOUNTER — Ambulatory Visit (HOSPITAL_BASED_OUTPATIENT_CLINIC_OR_DEPARTMENT_OTHER)
Admission: RE | Admit: 2023-04-26 | Discharge: 2023-04-26 | Disposition: A | Discharge status: Home / Self Care | Source: Ambulatory Visit | Attending: Cardiology | Admitting: Cardiology

## 2023-04-26 DIAGNOSIS — R931 Abnormal findings on diagnostic imaging of heart and coronary circulation: Secondary | ICD-10-CM

## 2023-04-26 DIAGNOSIS — I2510 Atherosclerotic heart disease of native coronary artery without angina pectoris: Secondary | ICD-10-CM | POA: Diagnosis not present

## 2023-05-02 ENCOUNTER — Telehealth: Payer: Self-pay | Admitting: *Deleted

## 2023-05-02 NOTE — Telephone Encounter (Signed)
 Spoke with pt - aware he is in need of heart cath.  He would like to have procedure on Friday.  Unable to obtain lab prior to then.  2nd choice is Monday.  Per Mariah Shines - cath lab schedule is full.  Left message for pt to call back to discuss a different day.  He does have difficulty with transportation.  Will need lab and contrast prophylaxis.

## 2023-05-02 NOTE — Telephone Encounter (Signed)
 Eilleen Grates, MD to Me     05/02/23 12:59 PM Result Note I was able to get in contact with the patient.  He needs cardiac catheterization. The patient understands that risks included but are not limited to stroke (1 in 1000), death (1 in 1000), kidney failure [usually temporary] (1 in 500), bleeding (1 in 200), allergic reaction [possibly serious] (1 in 200).  The patient understands and agrees to proceed.   He will need contrast prophylaxis. CT CORONARY MORPH W/CTA COR W/SCORE W/CA W/CM &/OR WO/CM

## 2023-05-03 NOTE — Telephone Encounter (Signed)
 Left another message for pt to call back to discuss another day to schedule left heart cath.

## 2023-05-04 NOTE — Telephone Encounter (Signed)
 Returned a call back to [place] (Emergency contact/on DPR/provides transportation for pt) and informed him that we will need to bring the pt into the office to see a an APP, to arrange the cath.   Explained to Reymundo Caulk that being it has been over 30 days since he was last seen in the office, we will need to update his visit, do EKG, and arrange cath at that time.  Reymundo Caulk is aware that we will arrange the cath and obtain pre-cath labs all in that visit.   Reymundo Caulk stated that he is available to bring the pt to be seen sometime next week, and is ok with bringing him to our [facility] location if there is an availability.   Scheduled the pt to come into the office to see Neomi Banks NP on  next Tuesday 4/29 at 1:55 pm.  Reymundo Caulk is aware to have him arrive 15 mins prior to that appt.  Reymundo Caulk is aware that we will order the pts contrast prophylaxis pre-med at that visit, along with all his other cath instructions/date/time.   Reymundo Caulk verbalized understanding and agrees with this plan.  Reymundo Caulk states he will discuss this with the pt here in the next few mins.   Reymundo Caulk was appreciative for all the assistance provided.   Will send this message to Dr. Atlas Lea RN to make her aware of this plan.     Off note:  Noted in appt notes about contrast prophylaxis needed for cath

## 2023-05-04 NOTE — Telephone Encounter (Signed)
 [name] requesting cb to see if pt needs labs

## 2023-05-08 ENCOUNTER — Ambulatory Visit (HOSPITAL_BASED_OUTPATIENT_CLINIC_OR_DEPARTMENT_OTHER): Admitting: Family

## 2023-05-08 VITALS — BP 128/74 | HR 78 | Ht 68.0 in | Wt 219.8 lb

## 2023-05-08 DIAGNOSIS — R931 Abnormal findings on diagnostic imaging of heart and coronary circulation: Secondary | ICD-10-CM

## 2023-05-08 DIAGNOSIS — E118 Type 2 diabetes mellitus with unspecified complications: Secondary | ICD-10-CM

## 2023-05-08 DIAGNOSIS — Z79899 Other long term (current) drug therapy: Secondary | ICD-10-CM

## 2023-05-08 DIAGNOSIS — I10 Essential (primary) hypertension: Secondary | ICD-10-CM

## 2023-05-08 DIAGNOSIS — I25118 Atherosclerotic heart disease of native coronary artery with other forms of angina pectoris: Secondary | ICD-10-CM

## 2023-05-08 NOTE — H&P (View-Only) (Signed)
 Cardiology Office Note:  .   Date:  05/08/2023  ID:  [name], DOB 1945-08-05, MRN [id number] PCP: Lenn Quint, NP  [facility] Providers Cardiologist:  None    History of Present Illness: .   [name] is a 78 y.o. male with hx of CAD, DM2, HLD.   Established with Dr. Lavonne Prairie 03/21/23 with progressive exertional dyspnea and mild midsternal chest discomfort with exercise. Prior LHC 2003 with no CAD. Subsequent Cardiac CTA 04/25/23 calcium score of 0.153 with total plaque volume 76mm3 and severe stenosis (70-99%) due to soft plaque at prox/mid LAD.   Discussed the use of AI scribe software for clinical note transcription with the patient, who gave verbal consent to proceed.  History of Present Illness Presents today for follow up. He experiences chest pain during physical activities, such as walking to tend to his dogs and chickens, and occasionally while lying in bed. The pain lasts for seconds to a minute and has no specific alleviating factors. Intermittent shortness of breath occurs during physical activities, sometimes when walking back from tending to his animals. Occasional swelling in his feet or ankles subsides when he elevates his feet. He takes Jardiance for blood sugar management, with recent home CBG readings around 153 mg/dL.  Reviewed cardiac CTA in detail as well as cardiac catheterization.    ROS: Please see the history of present illness.    All other systems reviewed and are negative.   Studies Reviewed: .        Cardiac Studies & Procedures   ______________________________________________________________________________________________          CT SCANS  CT CORONARY MORPH W/CTA COR W/SCORE 04/25/2023  Narrative HISTORY: Chest pain/anginal equiv, intermediate CAD risk, not treadmill candidate  EXAM: Cardiac/Coronary  CT  TECHNIQUE: The patient was scanned on a [employer].  PROTOCOL: A 120 kV prospective scan  was triggered in the descending thoracic aorta at 111 HU's. Axial non-contrast 3 mm slices were carried out through the heart. The data set was analyzed on a dedicated work station and scored using the Agatston method. Gantry rotation speed was 250 msecs and collimation was .6 mm. Beta blockade and 0.8 mg of sl NTG was given. The 3D data set was reconstructed in 5% intervals of the 30-75 % of the R-R cycle. Systolic and diastolic phases were analyzed on a dedicated work station using MPR, MIP and VRT modes. The patient received contrast: 100mL OMNIPAQUE  IOHEXOL  350 MG/ML SOLN.  FINDINGS: Image quality: Good  Artifact: Limited  Coronary calcium score is 0.153, which places the patient in the 4th percentile for age and sex matched control.  Coronary arteries: Normal coronary origins.  Co-dominance.  Left main: Normal in caliber vessel, bifurcates into the LAD and LCX. Left main is patent.  LAD: Normal caliber vessel, reaches the apex, gives rise to 2 diagonal branches. Severe stenosis (70-99%) due to soft plaque at the proximal/mid LAD just distal to the takeoff of the first diagonal branch. 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 Remainder of the vessel is patent.  D1- Patent  D2- Patent  LCX: Normal caliber vessel, co-dominant, gives rise to 4 obtuse marginal branches. Minimal calcified plaque (< 25%) at the ostial segment otherwise the vessel is patent.  OM1- Patent  OM2- Patent  OM3- Patent  OM4- Patent  RCA: Normal caliber vessel, co-dominant, bifurcates into the PDA and PLB. RCA is patent.  Aorta: Normal size, 38.8 mm at  the mid ascending aorta (level of the PA bifurcation) measured double oblique. Aortic atherosclerosis.  Aortic Valve: Trileaflet, no significant calcification.  Other findings:  Normal pulmonary vein drainage into the left atrium.  Normal left atrial appendage without thrombus.  Normal size of the pulmonary  artery.  Please see separate report from [facility] for non-cardiac findings.  IMPRESSION: 1. Coronary calcium score of 0.153. This was 4th percentile for age and sex matched control.  2. Total plaque volume 73 mm3 which is 4th percentile for age- and sex-matched controls (calcified plaque 0 mm3; non-calcified plaque 73 mm3). TPV is mild.  3. Normal coronary origins with co-dominance.  4. Aortic atherosclerosis.  5. Severe stenosis (70-99%) due to soft plaque at the proximal/mid LAD just distal to the takeoff of the first diagonal branch. CAD-RADS 4.  6. CT FFR will be performed to further evaluate the LAD and results will be reported separately.  RECOMMENDATIONS: 1.  CT FFR is recommended.  2. Consider symptom-guided anti-ischemic pharmacotherapy as well as risk factor modification per guideline directed care. Clinical correlation is required.   Electronically Signed By: Olinda Bertrand On: 04/26/2023 17:50     ______________________________________________________________________________________________      Risk Assessment/Calculations:             Physical Exam:   VS:  BP 128/74   Pulse 78   Ht 5\' 8"  (1.727 m)   Wt 219 lb 12.8 oz (99.7 kg)   SpO2 98%   BMI 33.42 kg/m    Wt Readings from Last 3 Encounters:  05/08/23 219 lb 12.8 oz (99.7 kg)  03/21/23 231 lb (104.8 kg)  01/25/21 234 lb 2.1 oz (106.2 kg)    GEN: Well nourished, well developed in no acute distress NECK: No JVD; No carotid bruits CARDIAC: RRR, no murmurs, rubs, gallops RESPIRATORY:  Clear to auscultation without rales, wheezing or rhonchi  ABDOMEN: Soft, non-tender, non-distended EXTREMITIES:  No edema; No deformity   ASSESSMENT AND PLAN: .    Assessment & Plan Coronary artery disease / Chest pain / DOE   Cardiac CTA 04/25/23 calcium score of 0.153 with total plaque volume 60mm3 and severe stenosis (70-99%) due to soft plaque at prox/mid LAD (FFR 0.57). Symptomatic with  exertional dyspnea, exertional chest discomfort.  Recommended cardiac catheterization - Schedule cardiac catheterization -Labs with PCP 05/07/23 creatinine 0.86, GFR 88, Na 140, K 4.9, AST 16, ALT 17, Hb 14.7, Hct 45.6, wbc 8.1, Plt 205 - Will require prednisone , benadryl  for contrast prophylaxis prior to [facility]. -GDMT aspirin , 81mg  daily, Atorvastatin 20mg  daily.  Hypertension Blood pressure well-controlled with current medication regimen Ramipril 10mg  daily. Discussed to monitor BP at home at least 2 hours after medications and sitting for 5-10 minutes.   Type 2 diabetes mellitus Continue to follow with PCP. Present regimen Jardiance 10mg , Actos  45mg , Rybelsus 7mg  daily.  -Hold Jardiance 3 days  prior to [facility] and Rybelsus day of LHC.   HLD, LDL goal <70 -03/30/23 LDL 46. Continue Atorvastatin 20mg  daily      Informed Consent   Shared Decision Making/Informed Consent The risks [stroke (1 in 1000), death (1 in 1000), kidney failure [usually temporary] (1 in 500), bleeding (1 in 200), allergic reaction [possibly serious] (1 in 200)], benefits (diagnostic support and management of coronary artery disease) and alternatives of a cardiac catheterization were discussed in detail with Mr. Sugimoto and he is willing to proceed.     Dispo: follow up 2-3 weeks after LHC  Signed, Clearnce Curia,  NP

## 2023-05-08 NOTE — Progress Notes (Signed)
 Cardiology Office Note:  .   Date:  05/08/2023  ID:  [name], DOB 1945-08-05, MRN [id number] PCP: Lenn Quint, NP  [facility] Providers Cardiologist:  None    History of Present Illness: .   [name] is a 78 y.o. male with hx of CAD, DM2, HLD.   Established with Dr. Lavonne Prairie 03/21/23 with progressive exertional dyspnea and mild midsternal chest discomfort with exercise. Prior LHC 2003 with no CAD. Subsequent Cardiac CTA 04/25/23 calcium score of 0.153 with total plaque volume 76mm3 and severe stenosis (70-99%) due to soft plaque at prox/mid LAD.   Discussed the use of AI scribe software for clinical note transcription with the patient, who gave verbal consent to proceed.  History of Present Illness Presents today for follow up. He experiences chest pain during physical activities, such as walking to tend to his dogs and chickens, and occasionally while lying in bed. The pain lasts for seconds to a minute and has no specific alleviating factors. Intermittent shortness of breath occurs during physical activities, sometimes when walking back from tending to his animals. Occasional swelling in his feet or ankles subsides when he elevates his feet. He takes Jardiance for blood sugar management, with recent home CBG readings around 153 mg/dL.  Reviewed cardiac CTA in detail as well as cardiac catheterization.    ROS: Please see the history of present illness.    All other systems reviewed and are negative.   Studies Reviewed: .        Cardiac Studies & Procedures   ______________________________________________________________________________________________          CT SCANS  CT CORONARY MORPH W/CTA COR W/SCORE 04/25/2023  Narrative HISTORY: Chest pain/anginal equiv, intermediate CAD risk, not treadmill candidate  EXAM: Cardiac/Coronary  CT  TECHNIQUE: The patient was scanned on a [employer].  PROTOCOL: A 120 kV prospective scan  was triggered in the descending thoracic aorta at 111 HU's. Axial non-contrast 3 mm slices were carried out through the heart. The data set was analyzed on a dedicated work station and scored using the Agatston method. Gantry rotation speed was 250 msecs and collimation was .6 mm. Beta blockade and 0.8 mg of sl NTG was given. The 3D data set was reconstructed in 5% intervals of the 30-75 % of the R-R cycle. Systolic and diastolic phases were analyzed on a dedicated work station using MPR, MIP and VRT modes. The patient received contrast: 100mL OMNIPAQUE  IOHEXOL  350 MG/ML SOLN.  FINDINGS: Image quality: Good  Artifact: Limited  Coronary calcium score is 0.153, which places the patient in the 4th percentile for age and sex matched control.  Coronary arteries: Normal coronary origins.  Co-dominance.  Left main: Normal in caliber vessel, bifurcates into the LAD and LCX. Left main is patent.  LAD: Normal caliber vessel, reaches the apex, gives rise to 2 diagonal branches. Severe stenosis (70-99%) due to soft plaque at the proximal/mid LAD just distal to the takeoff of the first diagonal branch. 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 Remainder of the vessel is patent.  D1- Patent  D2- Patent  LCX: Normal caliber vessel, co-dominant, gives rise to 4 obtuse marginal branches. Minimal calcified plaque (< 25%) at the ostial segment otherwise the vessel is patent.  OM1- Patent  OM2- Patent  OM3- Patent  OM4- Patent  RCA: Normal caliber vessel, co-dominant, bifurcates into the PDA and PLB. RCA is patent.  Aorta: Normal size, 38.8 mm at  the mid ascending aorta (level of the PA bifurcation) measured double oblique. Aortic atherosclerosis.  Aortic Valve: Trileaflet, no significant calcification.  Other findings:  Normal pulmonary vein drainage into the left atrium.  Normal left atrial appendage without thrombus.  Normal size of the pulmonary  artery.  Please see separate report from [facility] for non-cardiac findings.  IMPRESSION: 1. Coronary calcium score of 0.153. This was 4th percentile for age and sex matched control.  2. Total plaque volume 73 mm3 which is 4th percentile for age- and sex-matched controls (calcified plaque 0 mm3; non-calcified plaque 73 mm3). TPV is mild.  3. Normal coronary origins with co-dominance.  4. Aortic atherosclerosis.  5. Severe stenosis (70-99%) due to soft plaque at the proximal/mid LAD just distal to the takeoff of the first diagonal branch. CAD-RADS 4.  6. CT FFR will be performed to further evaluate the LAD and results will be reported separately.  RECOMMENDATIONS: 1.  CT FFR is recommended.  2. Consider symptom-guided anti-ischemic pharmacotherapy as well as risk factor modification per guideline directed care. Clinical correlation is required.   Electronically Signed By: Olinda Bertrand On: 04/26/2023 17:50     ______________________________________________________________________________________________      Risk Assessment/Calculations:             Physical Exam:   VS:  BP 128/74   Pulse 78   Ht 5\' 8"  (1.727 m)   Wt 219 lb 12.8 oz (99.7 kg)   SpO2 98%   BMI 33.42 kg/m    Wt Readings from Last 3 Encounters:  05/08/23 219 lb 12.8 oz (99.7 kg)  03/21/23 231 lb (104.8 kg)  01/25/21 234 lb 2.1 oz (106.2 kg)    GEN: Well nourished, well developed in no acute distress NECK: No JVD; No carotid bruits CARDIAC: RRR, no murmurs, rubs, gallops RESPIRATORY:  Clear to auscultation without rales, wheezing or rhonchi  ABDOMEN: Soft, non-tender, non-distended EXTREMITIES:  No edema; No deformity   ASSESSMENT AND PLAN: .    Assessment & Plan Coronary artery disease / Chest pain / DOE   Cardiac CTA 04/25/23 calcium score of 0.153 with total plaque volume 60mm3 and severe stenosis (70-99%) due to soft plaque at prox/mid LAD (FFR 0.57). Symptomatic with  exertional dyspnea, exertional chest discomfort.  Recommended cardiac catheterization - Schedule cardiac catheterization -Labs with PCP 05/07/23 creatinine 0.86, GFR 88, Na 140, K 4.9, AST 16, ALT 17, Hb 14.7, Hct 45.6, wbc 8.1, Plt 205 - Will require prednisone , benadryl  for contrast prophylaxis prior to [facility]. -GDMT aspirin , 81mg  daily, Atorvastatin 20mg  daily.  Hypertension Blood pressure well-controlled with current medication regimen Ramipril 10mg  daily. Discussed to monitor BP at home at least 2 hours after medications and sitting for 5-10 minutes.   Type 2 diabetes mellitus Continue to follow with PCP. Present regimen Jardiance 10mg , Actos  45mg , Rybelsus 7mg  daily.  -Hold Jardiance 3 days  prior to [facility] and Rybelsus day of LHC.   HLD, LDL goal <70 -03/30/23 LDL 46. Continue Atorvastatin 20mg  daily      Informed Consent   Shared Decision Making/Informed Consent The risks [stroke (1 in 1000), death (1 in 1000), kidney failure [usually temporary] (1 in 500), bleeding (1 in 200), allergic reaction [possibly serious] (1 in 200)], benefits (diagnostic support and management of coronary artery disease) and alternatives of a cardiac catheterization were discussed in detail with Mr. Sugimoto and he is willing to proceed.     Dispo: follow up 2-3 weeks after LHC  Signed, Clearnce Curia,  NP

## 2023-05-08 NOTE — Patient Instructions (Signed)
 Medication Instructions:  Your physician recommends that you continue on your current medications as directed. Please refer to the Current Medication list given to you today.  Testing/Procedures: You are scheduled for a Cardiac Catheterization on Monday, May 5 with Dr. Veryl Gottron End.  1. Please arrive at the [facility] (Main Entrance A) at [facility]: [address] [place], [place] [address] at 8:30 AM (This time is 2 hour(s) before your procedure to ensure your preparation).   Free valet parking service is available. You will check in at ADMITTING. The support person will be asked to wait in the waiting room.  It is OK to have someone drop you off and come back when you are ready to be discharged.    Special note: Every effort is made to have your procedure done on time. Please understand that emergencies sometimes delay scheduled procedures.  2. Diet: Do not eat solid foods after midnight.  The patient may have clear liquids until 5am upon the day of the procedure.  3. Labs: done 4/28 at [facility]   4. Medication instructions in preparation for your procedure: Hold Jardiance for 3 days before   Hold Actos  & Rybellsus the day of your cath   On the morning of your procedure, take your Aspirin  81 mg and any morning medicines NOT listed above.  You may use sips of water .  5. Plan to go home the same day, you will only stay overnight if medically necessary. 6. Bring a current list of your medications and current insurance cards. 7. You MUST have a responsible person to drive you home. 8. Someone MUST be with you the first 24 hours after you arrive home or your discharge will be delayed. 9. Please wear clothes that are easy to get on and off and wear slip-on shoes.  Follow-Up: At [facility], you and your health needs are our priority.  As part of our continuing mission to provide you with exceptional heart care, our providers are all part of one team.  This team includes  your primary Cardiologist (physician) and Advanced Practice Providers or APPs (Physician Assistants and Nurse Practitioners) who all work together to provide you with the care you need, when you need it.  Your next appointment:   2-3 weeks post cath with Dr. Lavonne Prairie in [place]

## 2023-05-10 ENCOUNTER — Telehealth: Payer: Self-pay | Admitting: *Deleted

## 2023-05-10 ENCOUNTER — Encounter (HOSPITAL_BASED_OUTPATIENT_CLINIC_OR_DEPARTMENT_OTHER): Payer: Self-pay | Admitting: Family

## 2023-05-10 MED ORDER — PREDNISONE 50 MG PO TABS: ORAL_TABLET | ORAL | 0 refills | Status: DC

## 2023-05-10 NOTE — Telephone Encounter (Addendum)
 Cardiac Catheterization scheduled at [facility] for: Monday May 14, 2023 10:30 AM Arrival time [facility] Main Entrance A at: 8:30 AM  Nothing to eat after midnight prior to procedure, clear liquids until 5 AM day of procedure.  CONTRAST ALLERGY: yes-13 hour Prednisone  and Benadryl  Prep: 05/13/23 Prednisone  50 mg at 9:30 PM 05/14/23 Prednisone  50 mg at 3:30 AM 05/14/23 Prednisone  50 mg and Bendadryl 50 mg just prior to leaving home for hospital Pt states he does not drive.  Medication instructions: -Hold:  Rybelsus/Jardiance-AM of procedure -Other usual morning medications can be taken with sips of water  including aspirin  81 mg.  Plan to go home the same day, you will only stay overnight if medically necessary.  You must have responsible adult to drive you home.  Someone must be with you the first 24 hours after you arrive home.  Reviewed procedure instructions/contrast allergy prep with patient's caretaker (DPR), Reymundo Caulk.

## 2023-05-14 ENCOUNTER — Other Ambulatory Visit (HOSPITAL_COMMUNITY): Payer: Self-pay

## 2023-05-14 ENCOUNTER — Ambulatory Visit (HOSPITAL_COMMUNITY)
Admission: RE | Admit: 2023-05-14 | Discharge: 2023-05-14 | Disposition: A | Discharge status: Home / Self Care | Attending: Internal Medicine | Admitting: Internal Medicine

## 2023-05-14 ENCOUNTER — Other Ambulatory Visit: Payer: Self-pay | Admitting: Internal Medicine

## 2023-05-14 ENCOUNTER — Encounter (HOSPITAL_COMMUNITY)
Admission: RE | Disposition: A | Discharge status: Home / Self Care | Payer: Self-pay | Source: Home / Self Care | Attending: Internal Medicine

## 2023-05-14 DIAGNOSIS — Z7982 Long term (current) use of aspirin: Secondary | ICD-10-CM | POA: Insufficient documentation

## 2023-05-14 DIAGNOSIS — I10 Essential (primary) hypertension: Secondary | ICD-10-CM | POA: Diagnosis not present

## 2023-05-14 DIAGNOSIS — Z955 Presence of coronary angioplasty implant and graft: Secondary | ICD-10-CM

## 2023-05-14 DIAGNOSIS — E785 Hyperlipidemia, unspecified: Secondary | ICD-10-CM | POA: Diagnosis not present

## 2023-05-14 DIAGNOSIS — I2510 Atherosclerotic heart disease of native coronary artery without angina pectoris: Secondary | ICD-10-CM | POA: Insufficient documentation

## 2023-05-14 DIAGNOSIS — I25110 Atherosclerotic heart disease of native coronary artery with unstable angina pectoris: Secondary | ICD-10-CM | POA: Insufficient documentation

## 2023-05-14 DIAGNOSIS — Z7984 Long term (current) use of oral hypoglycemic drugs: Secondary | ICD-10-CM | POA: Diagnosis not present

## 2023-05-14 DIAGNOSIS — E119 Type 2 diabetes mellitus without complications: Secondary | ICD-10-CM | POA: Insufficient documentation

## 2023-05-14 DIAGNOSIS — Z79899 Other long term (current) drug therapy: Secondary | ICD-10-CM | POA: Diagnosis not present

## 2023-05-14 DIAGNOSIS — R931 Abnormal findings on diagnostic imaging of heart and coronary circulation: Secondary | ICD-10-CM | POA: Diagnosis not present

## 2023-05-14 DIAGNOSIS — I200 Unstable angina: Secondary | ICD-10-CM | POA: Diagnosis present

## 2023-05-14 DIAGNOSIS — I25118 Atherosclerotic heart disease of native coronary artery with other forms of angina pectoris: Secondary | ICD-10-CM

## 2023-05-14 HISTORY — PX: CORONARY IMAGING/OCT: CATH118326

## 2023-05-14 HISTORY — PX: LEFT HEART CATH AND CORONARY ANGIOGRAPHY: CATH118249

## 2023-05-14 HISTORY — PX: CORONARY STENT INTERVENTION: CATH118234

## 2023-05-14 LAB — GLUCOSE, CAPILLARY
Glucose-Capillary: 249 mg/dL — ABNORMAL HIGH (ref 70–99)
Glucose-Capillary: 157 mg/dL — ABNORMAL HIGH (ref 70–99)

## 2023-05-14 LAB — POCT ACTIVATED CLOTTING TIME: Activated Clotting Time: 268 s

## 2023-05-14 SURGERY — LEFT HEART CATH AND CORONARY ANGIOGRAPHY
Anesthesia: LOCAL

## 2023-05-14 MED ORDER — NITROGLYCERIN 1 MG/10 ML FOR IR/CATH LAB
INTRA_ARTERIAL | Status: DC | PRN
  Administered 2023-05-14: 200 ug via INTRACORONARY
  Administered 2023-05-14: 100 ug via INTRACORONARY

## 2023-05-14 MED ORDER — LABETALOL HCL 5 MG/ML IV SOLN: 10.0000 mg | INTRAVENOUS | Status: DC | PRN

## 2023-05-14 MED ORDER — ASPIRIN 81 MG PO CHEW: 81.0000 mg | CHEWABLE_TABLET | Freq: Every day | ORAL | Status: DC

## 2023-05-14 MED ORDER — HEPARIN SODIUM (PORCINE) 1000 UNIT/ML IJ SOLN
INTRAMUSCULAR | Status: AC
  Filled 2023-05-14: qty 20

## 2023-05-14 MED ORDER — HEPARIN SODIUM (PORCINE) 1000 UNIT/ML IJ SOLN
INTRAMUSCULAR | Status: DC | PRN
  Administered 2023-05-14: 5000 [IU] via INTRAVENOUS
  Administered 2023-05-14: 3000 [IU] via INTRAVENOUS
  Administered 2023-05-14: 5000 [IU] via INTRAVENOUS
  Administered 2023-05-14: 2000 [IU] via INTRAVENOUS

## 2023-05-14 MED ORDER — HEPARIN SODIUM (PORCINE) 1000 UNIT/ML IJ SOLN
INTRAMUSCULAR | Status: AC
  Filled 2023-05-14: qty 10

## 2023-05-14 MED ORDER — SODIUM CHLORIDE 0.9 % IV SOLN: 250.0000 mL | INTRAVENOUS | Status: DC | PRN

## 2023-05-14 MED ORDER — MIDAZOLAM HCL 2 MG/2ML IJ SOLN
INTRAMUSCULAR | Status: AC
  Filled 2023-05-14: qty 2

## 2023-05-14 MED ORDER — ONDANSETRON HCL 4 MG/2ML IJ SOLN: 4.0000 mg | Freq: Four times a day (QID) | INTRAMUSCULAR | Status: DC | PRN

## 2023-05-14 MED ORDER — SODIUM CHLORIDE 0.9% FLUSH: 3.0000 mL | Freq: Two times a day (BID) | INTRAVENOUS | Status: DC

## 2023-05-14 MED ORDER — ASPIRIN 81 MG PO CHEW: 81.0000 mg | CHEWABLE_TABLET | ORAL | Status: DC

## 2023-05-14 MED ORDER — LIDOCAINE HCL (PF) 1 % IJ SOLN
INTRAMUSCULAR | Status: DC | PRN
  Administered 2023-05-14: 2 mL

## 2023-05-14 MED ORDER — CLOPIDOGREL BISULFATE 75 MG PO TABS: 75.0000 mg | ORAL_TABLET | Freq: Every day | ORAL | Status: DC

## 2023-05-14 MED ORDER — ACETAMINOPHEN 325 MG PO TABS: 650.0000 mg | ORAL_TABLET | ORAL | Status: DC | PRN

## 2023-05-14 MED ORDER — HYDRALAZINE HCL 20 MG/ML IJ SOLN: 10.0000 mg | INTRAMUSCULAR | Status: DC | PRN

## 2023-05-14 MED ORDER — FENTANYL CITRATE (PF) 100 MCG/2ML IJ SOLN
INTRAMUSCULAR | Status: DC | PRN
  Administered 2023-05-14: 25 ug via INTRAVENOUS

## 2023-05-14 MED ORDER — VERAPAMIL HCL 2.5 MG/ML IV SOLN
INTRAVENOUS | Status: DC | PRN
  Administered 2023-05-14 (×2): 10 mL via INTRA_ARTERIAL

## 2023-05-14 MED ORDER — FENTANYL CITRATE (PF) 100 MCG/2ML IJ SOLN
INTRAMUSCULAR | Status: AC
  Filled 2023-05-14: qty 2

## 2023-05-14 MED ORDER — MIDAZOLAM HCL 2 MG/2ML IJ SOLN
INTRAMUSCULAR | Status: DC | PRN
  Administered 2023-05-14: 1 mg via INTRAVENOUS

## 2023-05-14 MED ORDER — CLOPIDOGREL BISULFATE 300 MG PO TABS
ORAL_TABLET | ORAL | Status: DC | PRN
  Administered 2023-05-14: 600 mg via ORAL

## 2023-05-14 MED ORDER — IOHEXOL 350 MG/ML SOLN
INTRAVENOUS | Status: DC | PRN
  Administered 2023-05-14: 117 mL

## 2023-05-14 MED ORDER — DIPHENHYDRAMINE HCL 50 MG/ML IJ SOLN
INTRAMUSCULAR | Status: AC
  Filled 2023-05-14: qty 1

## 2023-05-14 MED ORDER — HEPARIN (PORCINE) IN NACL 1000-0.9 UT/500ML-% IV SOLN
INTRAVENOUS | Status: DC | PRN
  Administered 2023-05-14: 1000 mL via SURGICAL_CAVITY

## 2023-05-14 MED ORDER — CLOPIDOGREL BISULFATE 75 MG PO TABS
75.0000 mg | ORAL_TABLET | Freq: Every day | ORAL | 0 refills | Status: DC
  Filled 2023-05-14: qty 30, 30d supply, fill #0

## 2023-05-14 MED ORDER — NITROGLYCERIN 0.4 MG SL SUBL: 0.4000 mg | SUBLINGUAL_TABLET | SUBLINGUAL | 99 refills | Status: AC | PRN

## 2023-05-14 MED ORDER — VERAPAMIL HCL 2.5 MG/ML IV SOLN
INTRAVENOUS | Status: AC
  Filled 2023-05-14: qty 2

## 2023-05-14 MED ORDER — LIDOCAINE HCL (PF) 1 % IJ SOLN
INTRAMUSCULAR | Status: AC
  Filled 2023-05-14: qty 30

## 2023-05-14 MED ORDER — NITROGLYCERIN 1 MG/10 ML FOR IR/CATH LAB
INTRA_ARTERIAL | Status: AC
  Filled 2023-05-14: qty 10

## 2023-05-14 MED ORDER — SODIUM CHLORIDE 0.9% FLUSH: 3.0000 mL | INTRAVENOUS | Status: DC | PRN

## 2023-05-14 MED ORDER — SODIUM CHLORIDE 0.9 % IV SOLN: INTRAVENOUS | Status: AC

## 2023-05-14 MED ORDER — CLOPIDOGREL BISULFATE 75 MG PO TABS: 75.0000 mg | ORAL_TABLET | Freq: Every day | ORAL | 2 refills | Status: DC

## 2023-05-14 MED ORDER — DIPHENHYDRAMINE HCL 50 MG/ML IJ SOLN
25.0000 mg | INTRAMUSCULAR | Status: AC
  Administered 2023-05-14: 25 mg via INTRAVENOUS

## 2023-05-14 MED ORDER — SODIUM CHLORIDE 0.9 % WEIGHT BASED INFUSION: 1.0000 mL/kg/h | INTRAVENOUS | Status: DC

## 2023-05-14 MED ORDER — SODIUM CHLORIDE 0.9 % WEIGHT BASED INFUSION: 3.0000 mL/kg/h | INTRAVENOUS | Status: AC

## 2023-05-14 SURGICAL SUPPLY — 12 items
BALLOON SAPPHIRE NC24 3.75X10 (BALLOONS) IMPLANT
CATH 5FR JL3.5 JR4 ANG PIG MP (CATHETERS) IMPLANT
CATH DRAGONFLY OPSTAR (CATHETERS) IMPLANT
CATH VISTA GUIDE 6FR XBLD 3.5 (CATHETERS) IMPLANT
DEVICE RAD COMP TR BAND LRG (VASCULAR PRODUCTS) IMPLANT
GLIDESHEATH SLEND SS 6F .021 (SHEATH) IMPLANT
GUIDEWIRE INQWIRE 1.5J.035X260 (WIRE) IMPLANT
KIT ENCORE 26 ADVANTAGE (KITS) IMPLANT
PACK CARDIAC CATHETERIZATION (CUSTOM PROCEDURE TRAY) ×1 IMPLANT
SET ATX-X65L (MISCELLANEOUS) IMPLANT
STENT SYNERGY XD 3.50X12 (Permanent Stent) IMPLANT
WIRE RUNTHROUGH .014X180CM (WIRE) IMPLANT

## 2023-05-14 NOTE — Progress Notes (Signed)
 TR BAND REMOVAL  LOCATION:    right radial  DEFLATED PER PROTOCOL:    Yes.    TIME BAND OFF / DRESSING APPLIED:    1500 gauze dressing applied   SITE UPON ARRIVAL:    Level 0  SITE AFTER BAND REMOVAL:    Level 0  CIRCULATION SENSATION AND MOVEMENT:    Within Normal Limits   Yes.    COMMENTS:   no new issues noted

## 2023-05-14 NOTE — Discharge Instructions (Signed)

## 2023-05-14 NOTE — Interval H&P Note (Signed)
 History and Physical Interval Note:  05/14/2023 10:23 AM  [name]  has presented today for surgery, with the diagnosis of unstable angina and abnormal coronary CTA.  The various methods of treatment have been discussed with the patient and family. After consideration of risks, benefits and other options for treatment, the patient has consented to  Procedure(s): LEFT HEART CATH AND CORONARY ANGIOGRAPHY (N/A) as a surgical intervention.  The patient's history has been reviewed, patient examined, no change in status, stable for surgery.  I have reviewed the patient's chart and labs.  Questions were answered to the patient's satisfaction.    Cath Lab Visit (complete for each Cath Lab visit)  Clinical Evaluation Leading to the Procedure:   ACS: No.  Non-ACS:    Anginal Classification: CCS IV  Anti-ischemic medical therapy: No Therapy  Non-Invasive Test Results: Coronary CTA with severe proximal/mid LAD disease ->intermediate to high risk  Prior CABG: No previous CABG  [name]

## 2023-05-14 NOTE — Progress Notes (Signed)
 CARDIAC REHAB PHASE I     Post stent education including site care, restrictions, risk factors, exercise guidelines, NTG use, antiplatelet therapy importance, heart healthy diabetic diet and CRP2 reviewed. All questions and concerns addressed. Will refer to AP for CRP2. Plan for home later today.    1315-1400 Ronny Colas, RN BSN 05/14/2023 2:44 PM

## 2023-05-14 NOTE — Discharge Summary (Signed)
 Discharge Summary for Same Day PCI   Patient ID: [name] MRN: [id number]; DOB: 1945/11/05  Admit date: 05/14/2023 Discharge date: 05/14/2023  Primary Care Provider: Lenn Quint, NP  Primary Cardiologist: Eilleen Grates, MD  Primary Electrophysiologist:  None   Discharge Diagnoses    Active Problems:   Coronary artery disease    Diagnostic Studies/Procedures    Cardiac Catheterization 05/14/2023:  Conclusions: Severe single-vessel coronary artery disease with focal 90% mid LAD stenosis followed by a long segment of mild to moderate disease with suspicion for underlying myocardial bridging.  Mild, nonobstructive CAD also noted in the LCx and RCA. Normal left ventricular systolic function (LVEF 55-65%) and filling pressure (LVEDP 12 mmHg). Successful OCT-guided PCI to proximal/mid LAD using Synergy 3.5 x 12 mm drug-eluting stent with 0% residual stenosis and TIMI-3 flow.   Recommendations: Dual antiplatelet therapy with aspirin  and clopidogrel for at least 6 months. Aggressive secondary prevention of coronary artery disease. Anticipate same-day discharge if no post-PCI complications occur during 6-hour monitoring period.   Diagnostic Dominance: Co-dominant  Intervention   _____________   History of Present Illness     [name] is a 78 y.o. male with a past medical history of CAD, type 2 diabetes, hyperlipidemia.  She was first seen by Dr. Lavonne Prairie in 03/2023 for evaluation of chest pain, exertional dyspnea.  Underwent coronary CTA 04/25/2023 that showed a coronary calcium score of 0.153 (4th percentile), total plaque volume 73 mm (4th percentile), severe stenosis due to soft plaque at the proximal/mid LAD.  Study was sent for [facility] which showed hemodynamically significant stenosis in the proximal-mid LAD.  Cardiac catheterization was arranged for further evaluation.  Hospital Course     The patient underwent cardiac cath as noted above with successful  OCT-guided PCI to the proximal-mid LAD using DES. Plan for DAPT with ASA/plavix for at least 6 months. The patient was seen by cardiac rehab while in short stay. There were no observed complications post cath. Radial cath site was re-evaluated prior to discharge and found to be stable without any complications. Instructions/precautions regarding cath site care were given prior to discharge.  [name] was seen by Dr. Nolan Battle and determined stable for discharge home. Follow up with our office has been arranged. Medications are listed below. Pertinent changes include addition of plavix 75 mg daily and prescription for SL nitroglycerin .  Patient has a follow up appointment with Dr. Lavonne Prairie on 6/4    _____________  Cath/PCI Registry Performance & Quality Measures: Aspirin  prescribed? - Yes ADP Receptor Inhibitor (Plavix/Clopidogrel, Brilinta/Ticagrelor or Effient/Prasugrel) prescribed (includes medically managed patients)? - Yes High Intensity Statin (Lipitor 40-80mg  or Crestor 20-40mg ) prescribed? - No - LDL 46 (at goal) on lipitor 20 mg daily  For EF <40%, was ACEI/ARB prescribed? - No - Outpatient Echocardiogram to assess EF will be scheduled. For EF <40%, Aldosterone Antagonist (Spironolactone or Eplerenone) prescribed? - No - Outpatient Echocardiogram to assess EF will be scheduled. Cardiac Rehab Phase II ordered (Included Medically managed Patients)? - Yes  _____________   Discharge Vitals Blood pressure 119/81, pulse 90, temperature 98.4 F (36.9 C), temperature source Oral, resp. rate 18, height 5\' 8"  (1.727 m), weight 102.1 kg, SpO2 95%.  Filed Weights   05/14/23 0847  Weight: 102.1 kg    Last Labs & Radiologic Studies    CBC No results for input(s): "WBC", "NEUTROABS", "HGB", "HCT", "MCV", "PLT" in the last 72 hours. Basic Metabolic Panel No results for input(s): "NA", "K", "CL", "  CO2", "GLUCOSE", "BUN", "CREATININE", "CALCIUM", "MG", "PHOS" in the last 72 hours. Liver  Function Tests No results for input(s): "AST", "ALT", "ALKPHOS", "BILITOT", "PROT", "ALBUMIN" in the last 72 hours. No results for input(s): "LIPASE", "AMYLASE" in the last 72 hours. High Sensitivity Troponin:   No results for input(s): "TROPONINIHS" in the last 720 hours.  BNP Invalid input(s): "POCBNP" D-Dimer No results for input(s): "DDIMER" in the last 72 hours. Hemoglobin A1C No results for input(s): "HGBA1C" in the last 72 hours. Fasting Lipid Panel No results for input(s): "CHOL", "HDL", "LDLCALC", "TRIG", "CHOLHDL", "LDLDIRECT" in the last 72 hours. Thyroid Function Tests No results for input(s): "TSH", "T4TOTAL", "T3FREE", "THYROIDAB" in the last 72 hours.  Invalid input(s): "FREET3" _____________  CARDIAC CATHETERIZATION Result Date: 05/14/2023 Conclusions: Severe single-vessel coronary artery disease with focal 90% mid LAD stenosis followed by a long segment of mild to moderate disease with suspicion for underlying myocardial bridging.  Mild, nonobstructive CAD also noted in the LCx and RCA. Normal left ventricular systolic function (LVEF 55-65%) and filling pressure (LVEDP 12 mmHg). Successful OCT-guided PCI to proximal/mid LAD using Synergy 3.5 x 12 mm drug-eluting stent with 0% residual stenosis and TIMI-3 flow. Recommendations: Dual antiplatelet therapy with aspirin  and clopidogrel for at least 6 months. Aggressive secondary prevention of coronary artery disease. Anticipate same-day discharge if no post-PCI complications occur during 6-hour monitoring period. Sammy Crisp, MD [facility]   CT CORONARY FFR DATA PREP & FLUID ANALYSIS Result Date: 04/26/2023 EXAM: CT FFR ANALYSIS CLINICAL DATA:  Chest pain/anginal equiv, intermediate CAD risk, not treadmill candidate FINDINGS: FFRct analysis was performed on the original cardiac CT angiogram dataset. Diagrammatic representation of the FFRct analysis is provided in a separate PDF document in PACS. This dictation was created  using the PDF document and an interactive 3D model of the results. 3D model is not available in the EMR/PACS. Normal FFR range is >0.80. Indeterminate (grey) zone is 0.76-0.80. FFR delta of 0.13 is considered significant. 1. Left Main: FFR = 1.0 2. LAD: Proximal FFR = 0.99, Mid FFR = 0.68, Distal FFR = 0.57 3. LCX: Proximal FFR = 0.99, Distal FFR = 0.97 4. RCA: Proximal FFR = 0.99, Mid FFR =0.94, Distal FFR = 0.93 IMPRESSION: 1. CT FFR analysis shows hemodynamically significant stenosis in the proximal to mid LAD and it correlates to the CCTA findings. RECOMMENDATIONS: 1.  Recommend antiplatelet therapy and escalate antianginal therapy. 2. Guideline-directed medical therapy and aggressive risk factor modification for secondary prevention of coronary artery disease. 3. Invasive angiography should be considered in conjunction to medical therapy; however, clinical correlation required. Updated ordering physician (Dr. Lavonne Prairie) of the findings. Electronically Signed   By: Olinda Bertrand   On: 04/26/2023 22:16   CT CORONARY MORPH W/CTA COR W/SCORE W/CA W/CM &/OR WO/CM Result Date: 04/26/2023 HISTORY: Chest pain/anginal equiv, intermediate CAD risk, not treadmill candidate EXAM: Cardiac/Coronary  CT TECHNIQUE: The patient was scanned on a [employer]. PROTOCOL: A 120 kV prospective scan was triggered in the descending thoracic aorta at 111 HU's. Axial non-contrast 3 mm slices were carried out through the heart. The data set was analyzed on a dedicated work station and scored using the Agatston method. Gantry rotation speed was 250 msecs and collimation was .6 mm. Beta blockade and 0.8 mg of sl NTG was given. The 3D data set was reconstructed in 5% intervals of the 30-75 % of the R-R cycle. Systolic and diastolic phases were analyzed on a dedicated work station using MPR, MIP and  VRT modes. The patient received contrast: 100mL OMNIPAQUE  IOHEXOL  350 MG/ML SOLN. FINDINGS: Image quality: Good Artifact: Limited  Coronary calcium score is 0.153, which places the patient in the 4th percentile for age and sex matched control. Coronary arteries: Normal coronary origins.  Co-dominance. Left main: Normal in caliber vessel, bifurcates into the LAD and LCX. Left main is patent. LAD: Normal caliber vessel, reaches the apex, gives rise to 2 diagonal branches. Severe stenosis (70-99%) due to soft plaque at the proximal/mid LAD just distal to the takeoff of the first diagonal branch. 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 Remainder of the vessel is patent. D1- Patent D2- Patent LCX: Normal caliber vessel, co-dominant, gives rise to 4 obtuse marginal branches. Minimal calcified plaque (< 25%) at the ostial segment otherwise the vessel is patent. OM1- Patent OM2- Patent OM3- Patent OM4- Patent RCA: Normal caliber vessel, co-dominant, bifurcates into the PDA and PLB. RCA is patent. Aorta: Normal size, 38.8 mm at the mid ascending aorta (level of the PA bifurcation) measured double oblique. Aortic atherosclerosis. Aortic Valve: Trileaflet, no significant calcification. Other findings: Normal pulmonary vein drainage into the left atrium. Normal left atrial appendage without thrombus. Normal size of the pulmonary artery. Please see separate report from [facility] for non-cardiac findings. IMPRESSION: 1. Coronary calcium score of 0.153. This was 4th percentile for age and sex matched control. 2. Total plaque volume 73 mm3 which is 4th percentile for age- and sex-matched controls (calcified plaque 0 mm3; non-calcified plaque 73 mm3). TPV is mild. 3. Normal coronary origins with co-dominance. 4. Aortic atherosclerosis. 5. Severe stenosis (70-99%) due to soft plaque at the proximal/mid LAD just distal to the takeoff of the first diagonal branch. CAD-RADS 4. 6. CT FFR will be performed to further evaluate the LAD and results will be reported separately. RECOMMENDATIONS: 1.  CT FFR is recommended. 2.  Consider symptom-guided anti-ischemic pharmacotherapy as well as risk factor modification per guideline directed care. Clinical correlation is required. Electronically Signed   By: Olinda Bertrand   On: 04/26/2023 17:50    Disposition   Pt is being discharged home today in good condition.  Follow-up Plans & Appointments     Discharge Instructions     Amb Referral to Cardiac Rehabilitation   Complete by: As directed    Diagnosis: Coronary Stents   After initial evaluation and assessments completed: Virtual Based Care may be provided alone or in conjunction with Phase 2 Cardiac Rehab based on patient barriers.: Yes   Intensive Cardiac Rehabilitation (ICR) [facility] location only OR Traditional Cardiac Rehabilitation (TCR) *If criteria for ICR are not met will enroll in TCR [facility] only): Yes        Discharge Medications   Allergies as of 05/14/2023       Reactions   Latex    blisters   Sulfasalazine Hives   Sulfa Antibiotics    Iohexol  Rash    Code: RASH, Onset Date: 032520 Kidney dye   Penicillins Itching   Has patient had a PCN reaction causing immediate rash, facial/tongue/throat swelling, SOB or lightheadedness with hypotension: Yes Has patient had a PCN reaction causing severe rash involving mucus membranes or skin necrosis: No Has patient had a PCN reaction that required hospitalization No Has patient had a PCN reaction occurring within the last 10 years: No If all of the above answers are "NO", then may proceed with Cephalosporin use. PATIENT TOLERATED KEFLEX         Medication List  STOP taking these medications    predniSONE  50 MG tablet Commonly known as: DELTASONE        TAKE these medications    Accu-Chek Aviva Plus test strip Generic drug: glucose blood CHECK BLOOD SUGAR 2 TIMES A DAY   Accu-Chek Softclix Lancets lancets as directed 30 days1I   ALPRAZolam  0.5 MG tablet Commonly known as: XANAX  Take 0.5 mg by mouth 3 (three) times daily.   aspirin  EC  81 MG tablet Take 1 tablet (81 mg total) by mouth daily. Swallow whole.   atorvastatin 20 MG tablet Commonly known as: LIPITOR Take 20 mg by mouth daily.   clopidogrel 75 MG tablet Commonly known as: Plavix Take 1 tablet (75 mg total) by mouth daily.   dicyclomine  10 MG capsule Commonly known as: BENTYL  TAKE ONE CAPSULE EVERY TWELVE HOURS AS NEEDED FOR ABDOMINAL PAIN   empagliflozin 10 MG Tabs tablet Commonly known as: JARDIANCE Take 10 mg by mouth daily.   HYDROcodone -acetaminophen  10-325 MG tablet Commonly known as: NORCO Take 1 tablet by mouth 3 (three) times daily.   latanoprost 0.005 % ophthalmic solution Commonly known as: XALATAN Place 1 drop into both eyes every evening.   nitrofurantoin (macrocrystal-monohydrate) 100 MG capsule Commonly known as: MACROBID Take 100 mg by mouth 2 (two) times daily.   nitroGLYCERIN  0.4 MG SL tablet Commonly known as: Nitrostat  Place 1 tablet (0.4 mg total) under the tongue every 5 (five) minutes as needed for chest pain.   ramipril 5 MG capsule Commonly known as: ALTACE Take 5 mg by mouth daily.   Rybelsus 7 MG Tabs Generic drug: Semaglutide Take 7 mg by mouth daily.   tamsulosin  0.4 MG Caps capsule Commonly known as: FLOMAX  Take 0.4 mg by mouth daily.           Allergies Allergies  Allergen Reactions   Latex     blisters   Sulfasalazine Hives   Sulfa Antibiotics    Iohexol  Rash     Code: RASH, Onset Date: 032520 Kidney dye    Penicillins Itching    Has patient had a PCN reaction causing immediate rash, facial/tongue/throat swelling, SOB or lightheadedness with hypotension: Yes Has patient had a PCN reaction causing severe rash involving mucus membranes or skin necrosis: No Has patient had a PCN reaction that required hospitalization No Has patient had a PCN reaction occurring within the last 10 years: No If all of the above answers are "NO", then may proceed with Cephalosporin use. PATIENT TOLERATED KEFLEX      Outstanding Labs/Studies    Duration of Discharge Encounter   Greater than 30 minutes including physician time.  Signed, Debria Fang, PA-C 05/14/2023, 3:30 PM

## 2023-05-15 ENCOUNTER — Encounter (HOSPITAL_COMMUNITY): Payer: Self-pay | Admitting: Internal Medicine

## 2023-05-23 ENCOUNTER — Ambulatory Visit: Payer: Self-pay | Admitting: *Deleted

## 2023-06-11 NOTE — Progress Notes (Unsigned)
  Cardiology Office Note:   Date:  06/13/2023  ID:  [name], DOB 1945-10-24, MRN [id number] PCP: Lenn Quint, NP  [facility] Providers Cardiologist:  Eilleen Grates, MD {  History of Present Illness:   [name] is a 78 y.o. male  who presents for evaluation of CAD.  He had chest pain earlier this year and had a coronary CTA with obstructive LAD stenosis.  He had stenting of the prox LAD.  He presents for follow up.    Says he feels better since his stent.  He is able to do his yard work.  He is not getting any of the shortness of breath that he was having.  He has not had any chest pressure, neck or arm discomfort.  He has not had to take any nitroglycerin .  He denies any palpitations, presyncope or syncope.  Has had no weight gain or edema.   ROS:    As stated in the HPI and negative for all other systems.  Studies Reviewed:    EKG:   NA  Risk Assessment/Calculations:              Physical Exam:   VS:  BP 110/70   Pulse 64   Ht 5\' 8"  (1.727 m)   Wt 224 lb (101.6 kg)   BMI 34.06 kg/m    Wt Readings from Last 3 Encounters:  06/13/23 224 lb (101.6 kg)  05/14/23 225 lb (102.1 kg)  05/08/23 219 lb 12.8 oz (99.7 kg)     GEN: Well nourished, well developed in no acute distress NECK: No JVD; No carotid bruits CARDIAC: RRR, no murmurs, rubs, gallops RESPIRATORY:  Clear to auscultation without rales, wheezing or rhonchi  ABDOMEN: Soft, non-tender, non-distended EXTREMITIES:  No edema; No deformity   ASSESSMENT AND PLAN:   CAD:  The patient has no new sypmtoms.  No further cardiovascular testing is indicated.  We will continue with aggressive risk reduction and meds as listed.I reinforced that he needs to continue the Plavix  till I see him back next May.    DM: A1c is 8.5 which is down from 9.4.  He understands the need to get this down by working with Lenn Quint, NP   HTN: His blood pressure is at target.  No change in therapy.    DYSLIPIDEMIA: Goal LDL was at target at 45.  No change in therapy.    Follow up with me in May 2026  Signed, Eilleen Grates, MD

## 2023-06-13 ENCOUNTER — Ambulatory Visit (INDEPENDENT_AMBULATORY_CARE_PROVIDER_SITE_OTHER): Admitting: Cardiology

## 2023-06-13 ENCOUNTER — Encounter: Payer: Self-pay | Admitting: Cardiology

## 2023-06-13 VITALS — BP 110/70 | HR 64 | Ht 68.0 in | Wt 224.0 lb

## 2023-06-13 DIAGNOSIS — E118 Type 2 diabetes mellitus with unspecified complications: Secondary | ICD-10-CM | POA: Diagnosis not present

## 2023-06-13 DIAGNOSIS — I25118 Atherosclerotic heart disease of native coronary artery with other forms of angina pectoris: Secondary | ICD-10-CM | POA: Diagnosis not present

## 2023-06-13 DIAGNOSIS — I10 Essential (primary) hypertension: Secondary | ICD-10-CM

## 2023-06-13 MED ORDER — CLOPIDOGREL BISULFATE 75 MG PO TABS: 75.0000 mg | ORAL_TABLET | Freq: Every day | ORAL | 2 refills | Status: AC

## 2023-06-13 NOTE — Patient Instructions (Signed)
 Medication Instructions:  Your physician recommends that you continue on your current medications as directed. Please refer to the Current Medication list given to you today.  *If you need a refill on your cardiac medications before your next appointment, please call your pharmacy*  Lab Work: NONE   If you have labs (blood work) drawn today and your tests are completely normal, you will receive your results only by: MyChart Message (if you have MyChart) OR A paper copy in the mail If you have any lab test that is abnormal or we need to change your treatment, we will call you to review the results.  Testing/Procedures: NONE   Follow-Up: At [facility], you and your health needs are our priority.  As part of our continuing mission to provide you with exceptional heart care, our providers are all part of one team.  This team includes your primary Cardiologist (physician) and Advanced Practice Providers or APPs (Physician Assistants and Nurse Practitioners) who all work together to provide you with the care you need, when you need it.  Your next appointment:    May 2026   Provider:   Eilleen Grates, MD    We recommend signing up for the patient portal called "MyChart".  Sign up information is provided on this After Visit Summary.  MyChart is used to connect with patients for Virtual Visits (Telemedicine).  Patients are able to view lab/test results, encounter notes, upcoming appointments, etc.  Non-urgent messages can be sent to your provider as well.   To learn more about what you can do with MyChart, go to [email or web address].   Other Instructions Thank you for choosing [facility]!

## 2023-11-07 ENCOUNTER — Other Ambulatory Visit (HOSPITAL_COMMUNITY): Payer: Self-pay | Admitting: Internal Medicine

## 2023-11-07 DIAGNOSIS — R10A1 Flank pain, right side: Secondary | ICD-10-CM

## 2023-11-14 ENCOUNTER — Ambulatory Visit (HOSPITAL_COMMUNITY)
Admission: RE | Admit: 2023-11-14 | Discharge: 2023-11-14 | Disposition: A | Discharge status: Home / Self Care | Source: Ambulatory Visit | Attending: Internal Medicine | Admitting: Internal Medicine

## 2023-11-14 DIAGNOSIS — R10A1 Flank pain, right side: Secondary | ICD-10-CM | POA: Diagnosis present

## 2023-11-14 DIAGNOSIS — K5730 Diverticulosis of large intestine without perforation or abscess without bleeding: Secondary | ICD-10-CM | POA: Insufficient documentation

## 2023-11-14 DIAGNOSIS — N200 Calculus of kidney: Secondary | ICD-10-CM | POA: Diagnosis not present

## 2023-11-14 DIAGNOSIS — K4020 Bilateral inguinal hernia, without obstruction or gangrene, not specified as recurrent: Secondary | ICD-10-CM | POA: Insufficient documentation

## 2023-11-14 DIAGNOSIS — I700 Atherosclerosis of aorta: Secondary | ICD-10-CM | POA: Insufficient documentation

## 2023-11-14 DIAGNOSIS — N400 Enlarged prostate without lower urinary tract symptoms: Secondary | ICD-10-CM | POA: Insufficient documentation

## 2023-11-15 ENCOUNTER — Encounter (INDEPENDENT_AMBULATORY_CARE_PROVIDER_SITE_OTHER): Payer: Self-pay | Admitting: Gastroenterology

## 2023-12-27 ENCOUNTER — Encounter (INDEPENDENT_AMBULATORY_CARE_PROVIDER_SITE_OTHER): Payer: Self-pay | Admitting: *Deleted
